# Patient Record
Sex: Female | Born: 1945 | Race: White | Hispanic: No | Marital: Married | Smoking: Never smoker
Health system: Southern US, Community
[De-identification: ages and names within clinical notes are randomized; demographics above are authoritative.]

## PROBLEM LIST (undated history)

## (undated) DIAGNOSIS — R569 Unspecified convulsions: Secondary | ICD-10-CM

## (undated) DIAGNOSIS — I82409 Acute embolism and thrombosis of unspecified deep veins of unspecified lower extremity: Secondary | ICD-10-CM

## (undated) DIAGNOSIS — I2699 Other pulmonary embolism without acute cor pulmonale: Secondary | ICD-10-CM

## (undated) DIAGNOSIS — K219 Gastro-esophageal reflux disease without esophagitis: Secondary | ICD-10-CM

## (undated) DIAGNOSIS — K649 Unspecified hemorrhoids: Secondary | ICD-10-CM

## (undated) DIAGNOSIS — J449 Chronic obstructive pulmonary disease, unspecified: Secondary | ICD-10-CM

## (undated) DIAGNOSIS — I209 Angina pectoris, unspecified: Secondary | ICD-10-CM

## (undated) DIAGNOSIS — I10 Essential (primary) hypertension: Secondary | ICD-10-CM

## (undated) DIAGNOSIS — R0602 Shortness of breath: Secondary | ICD-10-CM

## (undated) DIAGNOSIS — M1990 Unspecified osteoarthritis, unspecified site: Secondary | ICD-10-CM

## (undated) DIAGNOSIS — R011 Cardiac murmur, unspecified: Secondary | ICD-10-CM

## (undated) DIAGNOSIS — Z87898 Personal history of other specified conditions: Secondary | ICD-10-CM

## (undated) DIAGNOSIS — I480 Paroxysmal atrial fibrillation: Secondary | ICD-10-CM

## (undated) DIAGNOSIS — Z87442 Personal history of urinary calculi: Secondary | ICD-10-CM

## (undated) DIAGNOSIS — E039 Hypothyroidism, unspecified: Secondary | ICD-10-CM

## (undated) DIAGNOSIS — J45909 Unspecified asthma, uncomplicated: Secondary | ICD-10-CM

## (undated) HISTORY — PX: LITHOTRIPSY: SUR834

## (undated) HISTORY — PX: CHOLECYSTECTOMY: SHX55

---

## 1991-03-20 HISTORY — PX: BREAST SURGERY: SHX581

## 1999-04-20 ENCOUNTER — Other Ambulatory Visit: Admission: RE | Admit: 1999-04-20 | Discharge: 1999-04-20 | Payer: Self-pay | Admitting: Gynecology

## 2000-07-23 ENCOUNTER — Other Ambulatory Visit: Admission: RE | Admit: 2000-07-23 | Discharge: 2000-07-23 | Payer: Self-pay | Admitting: Gynecology

## 2001-07-28 ENCOUNTER — Other Ambulatory Visit: Admission: RE | Admit: 2001-07-28 | Discharge: 2001-07-28 | Payer: Self-pay | Admitting: Gynecology

## 2002-08-10 ENCOUNTER — Other Ambulatory Visit: Admission: RE | Admit: 2002-08-10 | Discharge: 2002-08-10 | Payer: Self-pay | Admitting: Gynecology

## 2002-08-28 ENCOUNTER — Ambulatory Visit (HOSPITAL_COMMUNITY): Admission: RE | Admit: 2002-08-28 | Discharge: 2002-08-28 | Payer: Self-pay | Admitting: Gastroenterology

## 2002-08-28 ENCOUNTER — Encounter (INDEPENDENT_AMBULATORY_CARE_PROVIDER_SITE_OTHER): Payer: Self-pay | Admitting: Specialist

## 2002-08-28 ENCOUNTER — Encounter: Payer: Self-pay | Admitting: Gastroenterology

## 2003-09-09 ENCOUNTER — Other Ambulatory Visit: Admission: RE | Admit: 2003-09-09 | Discharge: 2003-09-09 | Payer: Self-pay | Admitting: Gynecology

## 2004-07-04 ENCOUNTER — Inpatient Hospital Stay (HOSPITAL_COMMUNITY): Admission: EM | Admit: 2004-07-04 | Discharge: 2004-07-06 | Payer: Self-pay | Admitting: *Deleted

## 2004-07-05 ENCOUNTER — Encounter: Payer: Self-pay | Admitting: Cardiology

## 2004-07-09 ENCOUNTER — Encounter: Admission: RE | Admit: 2004-07-09 | Discharge: 2004-07-09 | Payer: Self-pay | Admitting: Internal Medicine

## 2004-07-20 ENCOUNTER — Ambulatory Visit (HOSPITAL_COMMUNITY): Admission: RE | Admit: 2004-07-20 | Discharge: 2004-07-20 | Payer: Self-pay | Admitting: Internal Medicine

## 2004-09-11 ENCOUNTER — Other Ambulatory Visit: Admission: RE | Admit: 2004-09-11 | Discharge: 2004-09-11 | Payer: Self-pay | Admitting: Gynecology

## 2005-03-19 DIAGNOSIS — R569 Unspecified convulsions: Secondary | ICD-10-CM

## 2005-03-19 HISTORY — DX: Unspecified convulsions: R56.9

## 2006-12-13 ENCOUNTER — Emergency Department (HOSPITAL_COMMUNITY): Admission: EM | Admit: 2006-12-13 | Discharge: 2006-12-13 | Payer: Self-pay | Admitting: Emergency Medicine

## 2006-12-27 ENCOUNTER — Encounter: Admission: RE | Admit: 2006-12-27 | Discharge: 2006-12-27 | Payer: Self-pay | Admitting: Orthopedic Surgery

## 2007-07-21 ENCOUNTER — Encounter: Admission: RE | Admit: 2007-07-21 | Discharge: 2007-07-21 | Payer: Self-pay | Admitting: Gastroenterology

## 2008-10-01 ENCOUNTER — Encounter: Admission: RE | Admit: 2008-10-01 | Discharge: 2008-10-01 | Payer: Self-pay | Admitting: Internal Medicine

## 2009-02-17 ENCOUNTER — Encounter: Admission: RE | Admit: 2009-02-17 | Discharge: 2009-02-17 | Payer: Self-pay | Admitting: Internal Medicine

## 2009-10-11 ENCOUNTER — Encounter: Admission: RE | Admit: 2009-10-11 | Discharge: 2009-10-11 | Payer: Self-pay | Admitting: Internal Medicine

## 2009-10-17 ENCOUNTER — Ambulatory Visit (HOSPITAL_COMMUNITY): Admission: RE | Admit: 2009-10-17 | Discharge: 2009-10-17 | Payer: Self-pay | Admitting: Urology

## 2010-01-12 ENCOUNTER — Ambulatory Visit (HOSPITAL_COMMUNITY)
Admission: RE | Admit: 2010-01-12 | Discharge: 2010-01-12 | Payer: Self-pay | Source: Home / Self Care | Admitting: Urology

## 2010-03-19 DIAGNOSIS — I82409 Acute embolism and thrombosis of unspecified deep veins of unspecified lower extremity: Secondary | ICD-10-CM

## 2010-03-19 HISTORY — DX: Acute embolism and thrombosis of unspecified deep veins of unspecified lower extremity: I82.409

## 2010-05-31 LAB — CBC
HCT: 36.4 % (ref 36.0–46.0)
Hemoglobin: 12.5 g/dL (ref 12.0–15.0)
MCH: 30.4 pg (ref 26.0–34.0)
MCHC: 34.4 g/dL (ref 30.0–36.0)
MCV: 88.3 fL (ref 78.0–100.0)
Platelets: 246 10*3/uL (ref 150–400)
RBC: 4.12 MIL/uL (ref 3.87–5.11)
RDW: 15.4 % (ref 11.5–15.5)
WBC: 7.6 10*3/uL (ref 4.0–10.5)

## 2010-05-31 LAB — BASIC METABOLIC PANEL
BUN: 23 mg/dL (ref 6–23)
CO2: 29 mEq/L (ref 19–32)
Calcium: 9.2 mg/dL (ref 8.4–10.5)
Chloride: 107 mEq/L (ref 96–112)
Creatinine, Ser: 1.46 mg/dL — ABNORMAL HIGH (ref 0.4–1.2)
GFR calc Af Amer: 43 mL/min — ABNORMAL LOW (ref >60)
GFR calc non Af Amer: 36 mL/min — ABNORMAL LOW (ref >60)
Glucose, Bld: 102 mg/dL — ABNORMAL HIGH (ref 70–99)
Potassium: 4 mEq/L (ref 3.5–5.1)
Sodium: 142 mEq/L (ref 135–145)

## 2010-06-02 LAB — BASIC METABOLIC PANEL
BUN: 22 mg/dL (ref 6–23)
CO2: 27 mEq/L (ref 19–32)
Calcium: 9.5 mg/dL (ref 8.4–10.5)
Chloride: 105 mEq/L (ref 96–112)
Creatinine, Ser: 2 mg/dL — ABNORMAL HIGH (ref 0.4–1.2)
GFR calc Af Amer: 30 mL/min — ABNORMAL LOW (ref >60)
GFR calc non Af Amer: 25 mL/min — ABNORMAL LOW (ref >60)
Glucose, Bld: 101 mg/dL — ABNORMAL HIGH (ref 70–99)
Potassium: 4.1 mEq/L (ref 3.5–5.1)
Sodium: 141 mEq/L (ref 135–145)

## 2010-06-02 LAB — CBC
HCT: 37.6 % (ref 36.0–46.0)
Hemoglobin: 12.9 g/dL (ref 12.0–15.0)
MCH: 30.1 pg (ref 26.0–34.0)
MCHC: 34.3 g/dL (ref 30.0–36.0)
MCV: 87.7 fL (ref 78.0–100.0)
Platelets: 309 10*3/uL (ref 150–400)
RBC: 4.29 MIL/uL (ref 3.87–5.11)
RDW: 14.4 % (ref 11.5–15.5)
WBC: 8.1 10*3/uL (ref 4.0–10.5)

## 2010-08-04 NOTE — Discharge Summary (Signed)
Shannon Hammond, LAGO NO.:  [id number]   MEDICAL RECORD NO.:  [id number]          PATIENT TYPE:  INP   LOCATION:  [id number]                         FACILITY:  [facility]   PHYSICIAN:  Ron Parker, M.D.DATE OF BIRTH:  [birth year]   DATE OF ADMISSION:  07/04/2004  DATE OF DISCHARGE:                                 DISCHARGE SUMMARY   PRIMARY CARE PHYSICIAN:  Dr. Tommy Medal.   DISCHARGE DIAGNOSES:  1.  Transient amnesia.  2.  Hypertension.  3.  Hypothyroidism.  4.  Gastroesophageal reflux disease.  5.  Dyslipidemia.   DISCHARGE MEDICATIONS:  1.  Zocor 10 mg p.o. q.h.s.  2.  Two baby aspirin a day.   Old medications to resume:  1.  Synthroid 75 mcg p.o. daily.  2.  Maxzide 75/50, 1/2 tablet p.o. daily.  3.  Prevacid 30 mg p.o. daily.  4.  Nasonex p.r.n.   FOLLOWUP:  The patient is being currently scheduled for an open MRI as an  outpatient.  The patient needs to make an appointment to see her primary  care physician for follow-up labs as well as hospital followup.   DIAGNOSTIC STUDIES AND TESTS:  1.  Carotid Dopplers done July 05, 2004 show an impression of right carotid      68% (lowest end of range) ICA stenosis, ECA stenosis.  However, vessel      tortuosity may cause higher velocities.  Left:  No evidence of      significant ICA stenosis or ECA stenosis.  Bilaterally the vertebral      arteries show antegrade flow.  2.  A 2-D echo.  Impression:  Normal LV wall motion, EF 55-65%.  Aortic      valve was mildly thickened.  Mild mitral anular calcification.  There      was a mild increase in the estimated peak pulmonary artery systolic      pressure.  It was a technically difficult study to determine any obvious      source of embolus which was not present.  3.  Head CT without contrast day of admission shows no evidence for acute      intracranial abnormalities.   HOSPITAL COURSE:  Please see H&P for details of admission.  1.  Transient amnesia:  The  patient was worked up and ruled out for any      acute CVA.  TIA has not been excluded. However, symptoms do not appear      to be consistent with that.  She was on a baby aspirin a day, and that      has been empirically increased to 2 baby aspirin for both cardiac as      well as neurologic prophylaxis.  She had elevated fasting lipid panel,      and the MRI/MRA that has been scheduled will have to be done on an      outpatient basis, since there are no open MRIs available.  During her      workup, she was negative for cardiac enzymes.  A 2-D echo as above and  TSH returned back normal.  RPR was checked, but this was negative.      Cholesterol panel shows findings as described below.  Throughout her      hospitalization, she had no further episodes.  On the day of discharge      she was stable, tolerating good p.o. diet.  2.  Dyslipidemia:  Fasting lipid profile shows a total cholesterol of 202,      triglycerides of 74, HDL of 53 and LDL of 134.  She was initiated on      Zocor 10 mg at night.  For followup she needs LFTs checked to determine      whether or not she can continue on this dose.  At the time of discharge      she had not been complaining of any myopathies or myalgias.  3.  Hypertension.  No changes in medication, to resume her own medications.  4.  Hypothyroidism.  TSH was normal.  No change in her thyroid replacement      and will be continued on discharge.  5.  Gastroesophageal reflux disease.  No change.  Continue the Prevacid.   DISPOSITION:  The patient is being discharged home in stable condition.  Note that the carotid Dopplers do show that she has fairly significant  carotid disease.  However, it would not explain her symptoms.  The primary  care physician may consider a CVTS referral as an outpatient to determine  whether or not she would be a candidate for CEA.  Also recommended repeat  FLP in 6 weeks to determine increase in dose and needs encouragement  with  lifestyle modifications.  The patient has been instructed on discharge day  to make an appointment with Dr. Minna Antis following her MRI test which is being  scheduled currently and will be available on the pink sheet prior to her  leaving the hospital.      JD/MEDQ  D:  07/06/2004  T:  07/06/2004  Job:  [id number]   cc:   Tommy Medal, M.D.  [address]  [place], [place] [address]  Fax: [phone]

## 2010-08-04 NOTE — Op Note (Signed)
   NAME:  Shannon Hammond, Shannon Hammond                          ACCOUNT NO.:  [id number]   MEDICAL RECORD NO.:  [id number]                   PATIENT TYPE:  AMB   LOCATION:  ENDO                                 FACILITY:  [facility]   PHYSICIAN:  Jeryl Columbia, M.D.                 DATE OF BIRTH:  18-Feb-1946   DATE OF PROCEDURE:  08/28/2002  DATE OF DISCHARGE:                                 OPERATIVE REPORT   PROCEDURE:  EGD with biopsy.   INDICATION:  Upper tract symptoms.  Consent was signed after risks,  benefits, methods, options thoroughly discussed in the office.   MEDICINES USED ADDITIONALLY:  Only 2 of Versed.   DESCRIPTION OF PROCEDURE:  The video endoscope inserted by direct vision.  Esophagus was normal.  In the distal esophagus was a small hiatal hernia.  The scope passed into the stomach, where lots of palpable hyperplastic-  appearing polyps were seen, although some were moderate size.  Scope passed  through a normal antrum, normal pylorus, into a normal duodenal bulb  __________ second portion of duodenum.  The scope was withdrawn back to the  bulb, and a good look there ruled out ulcers in that location.  The scope  was drawn back to the stomach and retroflexed.  High in the cardia, the  hiatal hernia was confirmed.  Fundus, angularis, lesser and greater curve  were evaluated on retroflex and then straight visualization and other than  the polyps, no other abnormalities were seen.  Multiple biopsies were  obtained but based on the number of polyps, not all were sampled.  Air was  suctioned.  The scope was slowly withdrawn.  Again, a good look at the  esophagus was normal.  Scope was removed.  The patient tolerated the  procedure well, and there was no obvious immediate complication.   ENDOSCOPIC DIAGNOSES:  1. Small hiatal hernia.  2. Multiple gastric polyps, some biopsied.  3. Otherwise within normal limits esophagogastroduodenoscopy.   PLAN:  1. Continue pump inhibitors, as they  help her symptoms.  2. Await pathology.  3. Probably need to repeat EGD at some point in the future based on     pathology just re-sample more of the polyps.                                               Jeryl Columbia, M.D.    MEM/MEDQ  D:  08/28/2002  T:  08/28/2002  Job:  [id number]

## 2010-08-04 NOTE — Procedures (Signed)
CLINICAL HISTORY:  [age]-year-old woman with transient global amnesia 2  weeks ago, the episode lasted for an hour and a half. Study is done to look  for the presence of seizures.   PROCEDURE:  The tracing is carried out on a 32-channel digital Cadwell  recorder reformatted into 16-channel montages with one devoted to EKG. The  patient was awake during the recording. The International 10/20 system lead  placement was used.   DESCRIPTION OF FINDINGS:  Dominant frequency is an 8-9 Hz 15 microvolt alpha  range activity that is broadly distributed, mixed frequency predominantly  alpha and beta range activity were seen. Photic stimulation induced a  driving response from 9-17 Hz. There was no focal slowing. There was no  interictal epileptiform activity in the form of spikes or sharp waves. EKG  showed regular sinus rhythm with ventricular response of 54 beats per  minute.   IMPRESSION:  Normal waking record.      [id number]  D:  07/20/2004 17:23:16  T:  07/20/2004 19:26:56  Job #:  [id number]   cc:   Tommy Medal, M.D.  [address]  [place], [place] [address]  Fax: [phone]

## 2010-08-04 NOTE — H&P (Signed)
NAMEMAHEK, LASPISA NO.:  [id number]   MEDICAL RECORD NO.:  [id number]          PATIENT TYPE:  EMS   LOCATION:  ED                           [facility]ACILITY:  [facility]   PHYSICIAN:  Barbette Merino, M.D.      DATE OF BIRTH:  September 19, 1945   DATE OF ADMISSION:  07/04/2004  DATE OF DISCHARGE:                                HISTORY & PHYSICAL   PRIMARY CARE PHYSICIAN:  Tommy Medal, M.D.   CHIEF COMPLAINT:  Loss of memory and confusion.   HISTORY OF PRESENT ILLNESS:  This is a 65 year old female with history of  hypertension, dyslipidemia and hypothyroidism who presented with transient  amnesia this morning.  Per patient's husband he observed the patient was  confused this morning although she did not pass out.  He asked her multiple  questions including date, time and events that occurred the night before and  the patient had no recollection.  The patient herself has no recollection of  what happened this morning  All she remembered was being asked to make  coffee and that was it.  She tried to take her grandson to school but had no  recollection of doing that.  Her symptoms lasted approximately one and a  half hours but have since resolved.  The patient currently is alert and  oriented and has recollection of all events last night including an outing  she had with her daughter.  The patient's husband was worried and hence he  brought her to the emergency room.   PAST MEDICAL HISTORY:  1.  Hypertension.  2.  Hypothyroidism.  3.  GERD.  4.  Dyslipidemia.  5.  The patient is status post cholecystectomy.  6.  The patient reportedly had a heart murmur.   CURRENT MEDICATIONS:  1.  Synthroid 0.075 mg daily.  2.  Maxzide 75/50 one half tablet daily.  3.  Prevacid 30 mg daily.  4.  Baby aspirin.  5.  The patient also has Nasonex nasal inhaled for sinus disease allergic      rhinitis.   ALLERGIES:  No known drug allergies but she is intolerant to NSAIDs due to  GERD.   SOCIAL HISTORY:  The patient lives in [place] [facility] [place] [facility] with her husband and  one of her grandchildren.  She and her husband have two kids and four  grandchildren.  The patient has never smoked and does not drink alcohol.  Also denies any IV drug use.   FAMILY HISTORY:  The patient's dad died in his 54s from coronary artery  disease.  He had an MI at the age of 84.  Mom died last 12-11-2022 at the age of  84 years from fall and pneumonia.  The patient denied any family history of  CVA.   REVIEW OF SYSTEMS:  A 10-point review of systems performed essentially  negative.   PHYSICAL EXAMINATION:  VITAL SIGNS:  Temperature 97.9, blood pressure  162/100, pulse 81, respiratory rate 20, saturations 97% on room air.  GENERAL:  The patient is alert and oriented x3.  She is in no acute  distress.  Very conversant and pleasant.  HEENT:  PERRL.  EOMI.  No facial droop.  NECK:  Supple, no JVD, no lymphadenopathy.  RESPIRATORY:  The patient has good air entry bilaterally.  No wheezes or  rales.  CARDIOVASCULAR:  The patient has regular rate and rhythm.  No discernible  murmur.  ABDOMEN:  Looks obese, nontender, positive bowel sounds.  EXTREMITIES:  Show no edema, cyanosis or clubbing.  NEUROLOGIC:  Essentially nonfocal with cranial nerves II-XII essentially  intact.  Power 5/5 bilaterally upper and lower extremities and reflexes 2+  bilaterally.   LABORATORY DATA:  White count 6.9, hemoglobin 13.9, platelet 303 with normal  differential.  Sodium 142, potassium 3.8, chloride 101, CO2 27, glucose 94,  BUN 14, creatinine 0.9, calcium 9.5, total protein 6.8, albumin 3.4.  AST  20, ALT 19.  Initial cardiac enzymes from the ED essentially normal.  Chest  x-ray normal.  Head CT without contrast performed in the ED showed no acute  insult.  Her EKG showed normal sinus rhythm with a rate of 63 beats per  minute, normal axis.  Low voltage throughout.  Poor R-wave progression in  the anterolateral leads.  The  patient also had ST depression in her  anterolateral leads.   ASSESSMENT:  This is a 65 year old female presenting with transient amnesia  which has since resolved.  The patient is at risk for a number of conditions  including CVA and cardiac disease.  Her risk factors for CVA include  hypertension and dyslipidemia and obesity.  These same risk factors are also  therefore cardiac disease.  In view of the patient's presentation chances  are she may have had a TIA.  Other possibilities include drugs, thyroid  disease, cardiac in origin which is less likely as well as possible  infections.  Something like UTI.  The head CT so far is negative.  The  patient is claustrophobic and hence we have been unable to get MRI on her at  this point.   PLAN:  Our plan therefore, would be to admit the patient for 23-hour  observation.  Start her on full dose aspirin in addition to her PPI for GI  protection.  I will attempt to get MRI/MRA probably with sedation.  Will  check carotid Dopplers, 2-D echo and do serial cardiac enzymes.  Will check  fasting lipid profile also for patient, and if that is high we will start  her on some Statin.  I will follow the patient's cardiac rhythm on telemetry  as well as follow her TSH level to see if it is within the right range.  Ultimately the patient will need to start life style modification for stroke  prevention.      LG/MEDQ  D:  07/04/2004  T:  07/04/2004  Job:  [id number]   cc:   Tommy Medal, M.D.  [address]  [place], [place] [address]  Fax: [phone]

## 2010-08-04 NOTE — Op Note (Signed)
   NAME:  Shannon Hammond, Shannon Hammond                          ACCOUNT NO.:  [id number]   MEDICAL RECORD NO.:  [id number]                   PATIENT TYPE:  AMB   LOCATION:  ENDO                                 FACILITY:  [facility]   PHYSICIAN:  Jeryl Columbia, M.D.                 DATE OF BIRTH:  10-02-1945   DATE OF PROCEDURE:  08/28/2002  DATE OF DISCHARGE:                                 OPERATIVE REPORT   PROCEDURE:  Colonoscopy.   INDICATION:  Screening, history of colon polyps.  Consent was signed after  risks, benefits, methods, options were thoroughly discussed in the office on  multiple occasions.   MEDICINES USED:  1. Demerol 75.  2. Versed 8.   DESCRIPTION OF PROCEDURE:  Rectal inspection was pertinent for external  hemorrhoids, small.  Digital exam was negative first and a regular video  colonoscope was inserted with lots of difficulty due to a long, looping,  tortuous colon, could get to somewhere in the transverse, possibly hepatic  flexure but with advancing any further, causing pain despite trying rolling  her on her back, rolling her on her right side, and back to her left side  and multiple abdominal pressures.  We elected to slowly withdraw.  No polyps  were seen on withdrawal.  Once back in the rectum, the scope was  retroflexed, pertinent for some internal hemorrhoids.  Pediatric video  adjustable colonoscope was inserted, and possibly we were able to get  further but could not get to the cecum.  We did fully try with her on her  left side and her back.  The scope was again slowly withdrawn.  Again, no  polyps were seen.  The scope was removed.  The patient tolerated the  procedure fair.  There was no obvious immediate complication.   ENDOSCOPIC DIAGNOSES:  1. Internal-external hemorrhoids.  2. Tortuous, long, looping colon.  3. Otherwise, within normal limits to the questionable hepatic flexure.  We     placed a regular and pediatric adjustable scope.   PLAN:  1. Get a  barium enema and probably just get these every five years.  2. Continue work-up with an EGD.  3. Possibly in the future if we needed to re-try her, she might be a good     Diprivan candidate to relax her colon even more.                                               Jeryl Columbia, M.D.    MEM/MEDQ  D:  08/28/2002  T:  08/28/2002  Job:  [id number]

## 2011-06-21 ENCOUNTER — Other Ambulatory Visit: Payer: Self-pay | Admitting: Internal Medicine

## 2011-06-21 DIAGNOSIS — I2699 Other pulmonary embolism without acute cor pulmonale: Secondary | ICD-10-CM

## 2011-06-25 ENCOUNTER — Ambulatory Visit
Admission: RE | Admit: 2011-06-25 | Discharge: 2011-06-25 | Disposition: A | Discharge status: Home / Self Care | Payer: Medicare Other | Source: Ambulatory Visit | Attending: Internal Medicine | Admitting: Internal Medicine

## 2011-06-25 DIAGNOSIS — I2699 Other pulmonary embolism without acute cor pulmonale: Secondary | ICD-10-CM

## 2011-12-10 ENCOUNTER — Other Ambulatory Visit: Payer: Self-pay | Admitting: Internal Medicine

## 2011-12-10 DIAGNOSIS — R109 Unspecified abdominal pain: Secondary | ICD-10-CM

## 2011-12-13 ENCOUNTER — Ambulatory Visit
Admission: RE | Admit: 2011-12-13 | Discharge: 2011-12-13 | Disposition: A | Discharge status: Home / Self Care | Payer: Medicare Other | Source: Ambulatory Visit | Attending: Internal Medicine | Admitting: Internal Medicine

## 2011-12-13 DIAGNOSIS — R109 Unspecified abdominal pain: Secondary | ICD-10-CM

## 2012-04-09 ENCOUNTER — Other Ambulatory Visit (HOSPITAL_COMMUNITY): Payer: Self-pay | Admitting: Internal Medicine

## 2012-04-09 DIAGNOSIS — R0602 Shortness of breath: Secondary | ICD-10-CM

## 2012-04-16 ENCOUNTER — Ambulatory Visit (HOSPITAL_COMMUNITY)
Admission: RE | Admit: 2012-04-16 | Discharge: 2012-04-16 | Disposition: A | Discharge status: Home / Self Care | Payer: Medicare Other | Source: Ambulatory Visit | Attending: Internal Medicine | Admitting: Internal Medicine

## 2012-04-16 DIAGNOSIS — R0602 Shortness of breath: Secondary | ICD-10-CM | POA: Insufficient documentation

## 2012-04-16 MED ORDER — ALBUTEROL SULFATE (5 MG/ML) 0.5% IN NEBU
2.5000 mg | INHALATION_SOLUTION | Freq: Once | RESPIRATORY_TRACT | Status: AC
  Administered 2012-04-16: 2.5 mg via RESPIRATORY_TRACT

## 2012-10-08 ENCOUNTER — Other Ambulatory Visit: Payer: Self-pay | Admitting: Gastroenterology

## 2012-11-13 ENCOUNTER — Other Ambulatory Visit: Payer: Self-pay | Admitting: Internal Medicine

## 2012-11-13 ENCOUNTER — Ambulatory Visit
Admission: RE | Admit: 2012-11-13 | Discharge: 2012-11-13 | Disposition: A | Discharge status: Home / Self Care | Payer: Medicare Other | Source: Ambulatory Visit | Attending: Internal Medicine | Admitting: Internal Medicine

## 2012-11-13 DIAGNOSIS — R319 Hematuria, unspecified: Secondary | ICD-10-CM

## 2012-11-18 ENCOUNTER — Other Ambulatory Visit: Payer: Self-pay | Admitting: Urology

## 2012-11-19 ENCOUNTER — Other Ambulatory Visit: Payer: Self-pay | Admitting: Urology

## 2012-11-20 ENCOUNTER — Encounter (HOSPITAL_COMMUNITY): Payer: Self-pay | Admitting: Pharmacy Technician

## 2012-11-24 ENCOUNTER — Encounter (HOSPITAL_COMMUNITY): Payer: Self-pay

## 2012-11-24 ENCOUNTER — Ambulatory Visit (HOSPITAL_COMMUNITY)
Admission: RE | Admit: 2012-11-24 | Discharge: 2012-11-24 | Disposition: A | Discharge status: Home / Self Care | Payer: Medicare Other | Source: Ambulatory Visit | Attending: Urology | Admitting: Urology

## 2012-11-24 ENCOUNTER — Encounter (HOSPITAL_COMMUNITY)
Admission: RE | Admit: 2012-11-24 | Discharge: 2012-11-24 | Disposition: A | Discharge status: Home / Self Care | Payer: Medicare Other | Source: Ambulatory Visit | Attending: Urology | Admitting: Urology

## 2012-11-24 DIAGNOSIS — N201 Calculus of ureter: Secondary | ICD-10-CM | POA: Insufficient documentation

## 2012-11-24 DIAGNOSIS — Z01818 Encounter for other preprocedural examination: Secondary | ICD-10-CM | POA: Insufficient documentation

## 2012-11-24 DIAGNOSIS — Z01812 Encounter for preprocedural laboratory examination: Secondary | ICD-10-CM | POA: Insufficient documentation

## 2012-11-24 HISTORY — DX: Hypothyroidism, unspecified: E03.9

## 2012-11-24 HISTORY — DX: Personal history of other specified conditions: Z87.898

## 2012-11-24 HISTORY — DX: Personal history of urinary calculi: Z87.442

## 2012-11-24 HISTORY — DX: Unspecified osteoarthritis, unspecified site: M19.90

## 2012-11-24 HISTORY — DX: Acute embolism and thrombosis of unspecified deep veins of unspecified lower extremity: I82.409

## 2012-11-24 HISTORY — DX: Unspecified convulsions: R56.9

## 2012-11-24 HISTORY — DX: Angina pectoris, unspecified: I20.9

## 2012-11-24 HISTORY — DX: Other pulmonary embolism without acute cor pulmonale: I26.99

## 2012-11-24 HISTORY — DX: Gastro-esophageal reflux disease without esophagitis: K21.9

## 2012-11-24 HISTORY — DX: Shortness of breath: R06.02

## 2012-11-24 HISTORY — DX: Cardiac murmur, unspecified: R01.1

## 2012-11-24 HISTORY — DX: Unspecified hemorrhoids: K64.9

## 2012-11-24 LAB — CBC
HCT: 39.9 % (ref 36.0–46.0)
Hemoglobin: 12.9 g/dL (ref 12.0–15.0)
MCH: 28.2 pg (ref 26.0–34.0)
MCHC: 32.3 g/dL (ref 30.0–36.0)

## 2012-11-24 LAB — BASIC METABOLIC PANEL
BUN: 31 mg/dL — ABNORMAL HIGH (ref 6–23)
Calcium: 10.1 mg/dL (ref 8.4–10.5)
GFR calc non Af Amer: 26 mL/min — ABNORMAL LOW (ref >90)
Glucose, Bld: 86 mg/dL (ref 70–99)
Potassium: 4.5 mEq/L (ref 3.5–5.1)

## 2012-11-24 NOTE — Progress Notes (Signed)
11/24/12 1529  OBSTRUCTIVE SLEEP APNEA  Have you ever been diagnosed with sleep apnea through a sleep study? No  Do you snore loudly (loud enough to be heard through closed doors)?  0  Do you often feel tired, fatigued, or sleepy during the daytime? 1  Has anyone observed you stop breathing during your sleep? 0  Do you have, or are you being treated for high blood pressure? 1  BMI more than 35 kg/m2? 1  Age over 69 years old? 1  Neck circumference greater than 40 cm/18 inches? 0  Gender: 0  Obstructive Sleep Apnea Score 4  Score 4 or greater  Results sent to PCP

## 2012-11-24 NOTE — Pre-Procedure Instructions (Addendum)
Pt has ekg report on her chart from Dr. Minna Antis - done 04/03/12.  CXR was done today - preop - at [facility] REPORT FAXED TO DR. DAHLSTEDT'S OFFICE - BUN 31, CREATININE 1.90

## 2012-11-24 NOTE — Patient Instructions (Addendum)
YOUR SURGERY IS SCHEDULED AT [facility]  ON:  Thursday  9/11  REPORT TO [facility] SHORT STAY CENTER AT:  7:15 AM      PHONE # FOR SHORT STAY IS [phone]  DO NOT EAT OR DRINK ANYTHING AFTER MIDNIGHT THE NIGHT BEFORE YOUR SURGERY.  YOU MAY BRUSH YOUR TEETH, RINSE OUT YOUR MOUTH--BUT NO WATER, NO FOOD, NO CHEWING GUM, NO MINTS, NO CANDIES, NO CHEWING TOBACCO.  PLEASE TAKE THE FOLLOWING MEDICATIONS THE AM OF YOUR SURGERY WITH A FEW SIPS OF WATER:  METOPROLOL, LEVOTHYROXINE, OMEPRAZOLE.  USE YOUR INHALER ( XOPENEX )   DO NOT BRING VALUABLES, MONEY, CREDIT CARDS.  DO NOT WEAR JEWELRY, MAKE-UP, NAIL POLISH AND NO METAL PINS OR CLIPS IN YOUR HAIR. CONTACT LENS, DENTURES / PARTIALS, GLASSES SHOULD NOT BE WORN TO SURGERY AND IN MOST CASES-HEARING AIDS WILL NEED TO BE REMOVED.  BRING YOUR GLASSES CASE, ANY EQUIPMENT NEEDED FOR YOUR CONTACT LENS. FOR PATIENTS ADMITTED TO THE HOSPITAL--CHECK OUT TIME THE DAY OF DISCHARGE IS 11:00 AM.  ALL INPATIENT ROOMS ARE PRIVATE - WITH BATHROOM, TELEPHONE, TELEVISION AND WIFI INTERNET.  IF YOU ARE BEING DISCHARGED THE SAME DAY OF YOUR SURGERY--YOU CAN NOT DRIVE YOURSELF HOME--AND SHOULD NOT GO HOME ALONE BY TAXI OR BUS.  NO DRIVING OR OPERATING MACHINERY FOR 24 HOURS FOLLOWING ANESTHESIA / PAIN MEDICATIONS.  PLEASE MAKE ARRANGEMENTS FOR SOMEONE TO BE WITH YOU AT HOME THE FIRST 24 HOURS AFTER SURGERY. RESPONSIBLE DRIVER'S NAME  Nunda - PT'S HUSBAND WILL BE WITH HER                                                                           FAILURE TO FOLLOW THESE INSTRUCTIONS MAY RESULT IN THE CANCELLATION OF YOUR SURGERY.   PATIENT SIGNATURE_________________________________

## 2012-11-27 ENCOUNTER — Ambulatory Visit (HOSPITAL_COMMUNITY): Payer: Medicare Other | Admitting: Anesthesiology

## 2012-11-27 ENCOUNTER — Encounter (HOSPITAL_COMMUNITY): Payer: Self-pay | Admitting: Anesthesiology

## 2012-11-27 ENCOUNTER — Encounter (HOSPITAL_COMMUNITY)
Admission: RE | Disposition: A | Discharge status: Home / Self Care | Payer: Self-pay | Source: Ambulatory Visit | Attending: Urology

## 2012-11-27 ENCOUNTER — Ambulatory Visit (HOSPITAL_COMMUNITY)
Admission: RE | Admit: 2012-11-27 | Discharge: 2012-11-27 | Disposition: A | Discharge status: Home / Self Care | Payer: Medicare Other | Source: Ambulatory Visit | Attending: Urology | Admitting: Urology

## 2012-11-27 ENCOUNTER — Encounter (HOSPITAL_COMMUNITY): Payer: Self-pay | Admitting: *Deleted

## 2012-11-27 DIAGNOSIS — Z86718 Personal history of other venous thrombosis and embolism: Secondary | ICD-10-CM | POA: Insufficient documentation

## 2012-11-27 DIAGNOSIS — E039 Hypothyroidism, unspecified: Secondary | ICD-10-CM | POA: Insufficient documentation

## 2012-11-27 DIAGNOSIS — N289 Disorder of kidney and ureter, unspecified: Secondary | ICD-10-CM | POA: Insufficient documentation

## 2012-11-27 DIAGNOSIS — Z86711 Personal history of pulmonary embolism: Secondary | ICD-10-CM | POA: Insufficient documentation

## 2012-11-27 DIAGNOSIS — K219 Gastro-esophageal reflux disease without esophagitis: Secondary | ICD-10-CM | POA: Insufficient documentation

## 2012-11-27 DIAGNOSIS — N200 Calculus of kidney: Secondary | ICD-10-CM | POA: Insufficient documentation

## 2012-11-27 HISTORY — PX: CYSTOSCOPY WITH RETROGRADE PYELOGRAM, URETEROSCOPY AND STENT PLACEMENT: SHX5789

## 2012-11-27 HISTORY — PX: HOLMIUM LASER APPLICATION: SHX5852

## 2012-11-27 SURGERY — CYSTOURETEROSCOPY, WITH RETROGRADE PYELOGRAM AND STENT INSERTION
Anesthesia: General | Laterality: Right | Wound class: Dirty or Infected

## 2012-11-27 MED ORDER — PROPOFOL 10 MG/ML IV BOLUS
INTRAVENOUS | Status: DC | PRN
  Administered 2012-11-27: 200 mg via INTRAVENOUS

## 2012-11-27 MED ORDER — OXYCODONE HCL 5 MG PO TABS
5.0000 mg | ORAL_TABLET | ORAL | Status: DC | PRN
Start: 2012-11-27 — End: 2012-11-27

## 2012-11-27 MED ORDER — SODIUM CHLORIDE 0.9 % IR SOLN
Status: DC | PRN
  Administered 2012-11-27: 3000 mL via INTRAVESICAL

## 2012-11-27 MED ORDER — ONDANSETRON HCL 4 MG/2ML IJ SOLN
INTRAMUSCULAR | Status: DC | PRN
  Administered 2012-11-27: 4 mg via INTRAVENOUS

## 2012-11-27 MED ORDER — URIBEL 118 MG PO CAPS: 1.0000 | ORAL_CAPSULE | Freq: Three times a day (TID) | ORAL | Status: DC | PRN

## 2012-11-27 MED ORDER — MEPERIDINE HCL 50 MG/ML IJ SOLN: 6.2500 mg | INTRAMUSCULAR | Status: DC | PRN

## 2012-11-27 MED ORDER — LIDOCAINE HCL 1 % IJ SOLN
INTRAMUSCULAR | Status: DC | PRN
  Administered 2012-11-27: 60 mg via INTRADERMAL

## 2012-11-27 MED ORDER — FENTANYL CITRATE 0.05 MG/ML IJ SOLN
INTRAMUSCULAR | Status: DC | PRN
  Administered 2012-11-27: 100 ug via INTRAVENOUS

## 2012-11-27 MED ORDER — CEFAZOLIN SODIUM-DEXTROSE 2-3 GM-% IV SOLR
INTRAVENOUS | Status: AC
  Filled 2012-11-27: qty 50

## 2012-11-27 MED ORDER — MIDAZOLAM HCL 5 MG/5ML IJ SOLN
INTRAMUSCULAR | Status: DC | PRN
  Administered 2012-11-27 (×2): 2 mg via INTRAVENOUS

## 2012-11-27 MED ORDER — CIPROFLOXACIN HCL 500 MG PO TABS: 500.0000 mg | ORAL_TABLET | Freq: Two times a day (BID) | ORAL | Status: DC

## 2012-11-27 MED ORDER — CEFAZOLIN SODIUM 1-5 GM-% IV SOLN
INTRAVENOUS | Status: AC
  Filled 2012-11-27: qty 50

## 2012-11-27 MED ORDER — DEXTROSE 5 % IV SOLN
3.0000 g | INTRAVENOUS | Status: AC
  Administered 2012-11-27: 3 g via INTRAVENOUS

## 2012-11-27 MED ORDER — LACTATED RINGERS IV SOLN
INTRAVENOUS | Status: DC | PRN
  Administered 2012-11-27 (×2): via INTRAVENOUS

## 2012-11-27 MED ORDER — FENTANYL CITRATE 0.05 MG/ML IJ SOLN: 25.0000 ug | INTRAMUSCULAR | Status: DC | PRN

## 2012-11-27 MED ORDER — PROMETHAZINE HCL 25 MG/ML IJ SOLN: 6.2500 mg | INTRAMUSCULAR | Status: DC | PRN

## 2012-11-27 MED ORDER — DIATRIZOATE MEGLUMINE 30 % UR SOLN
URETHRAL | Status: DC | PRN
  Administered 2012-11-27: 18 mL

## 2012-11-27 SURGICAL SUPPLY — 20 items
BAG URO CATCHER STRL LF (DRAPE) ×2 IMPLANT
BASKET ZERO TIP NITINOL 2.4FR (BASKET) ×2 IMPLANT
CATH URET 5FR 28IN CONE TIP (BALLOONS)
CATH URET 5FR 28IN OPEN ENDED (CATHETERS) IMPLANT
CATH URET 5FR 70CM CONE TIP (BALLOONS) IMPLANT
CLOTH BEACON ORANGE TIMEOUT ST (SAFETY) ×2 IMPLANT
DRAPE CAMERA CLOSED 9X96 (DRAPES) ×2 IMPLANT
FIBER LASER TRAC TIP (UROLOGICAL SUPPLIES) ×2 IMPLANT
GLOVE BIOGEL M 8.0 STRL (GLOVE) ×2 IMPLANT
GLOVE SURG SS PI 8.0 STRL IVOR (GLOVE) IMPLANT
GOWN PREVENTION PLUS XLARGE (GOWN DISPOSABLE) ×2 IMPLANT
GOWN STRL REIN XL XLG (GOWN DISPOSABLE) ×2 IMPLANT
GUIDEWIRE STR DUAL SENSOR (WIRE) ×2 IMPLANT
MANIFOLD NEPTUNE II (INSTRUMENTS) ×2 IMPLANT
MARKER SKIN DUAL TIP RULER LAB (MISCELLANEOUS) IMPLANT
PACK CYSTO (CUSTOM PROCEDURE TRAY) ×2 IMPLANT
SHEATH ACCESS URETERAL 38CM (SHEATH) ×2 IMPLANT
STENT CONTOUR 6FRX24X.038 (STENTS) ×2 IMPLANT
TUBING CONNECTING 10 (TUBING) ×2 IMPLANT
WIRE COONS/BENSON .038X145CM (WIRE) IMPLANT

## 2012-11-27 NOTE — Transfer of Care (Signed)
Immediate Anesthesia Transfer of Care Note  Patient: Shannon Hammond  Procedure(s) Performed: Procedure(s): CYSTOSCOPY WITH RETROGRADE PYELOGRAM, URETEROSCOPY, stone basketry AND STENT PLACEMENT (Right) HOLMIUM LASER APPLICATION (Right)  Patient Location: PACU  Anesthesia Type:General  Level of Consciousness: awake, alert , oriented and patient cooperative  Airway & Oxygen Therapy: Patient Spontanous Breathing and Patient connected to face mask oxygen  Post-op Assessment: Report given to PACU RN, Post -op Vital signs reviewed and stable and Patient moving all extremities  Post vital signs: Reviewed and stable  Complications: No apparent anesthesia complications

## 2012-11-27 NOTE — H&P (Signed)
H&P  Chief Complaint: Kidney stone  History of Present Illness: Shannon Hammond is a 67 y.o. year old female who presents for endoscopic management of a right sided kidney stone. Her note from her most recent visit is below:    This 67 year old female with a prior history of urolithiasis is sent by [facility] for evaluation and management of a right sided kidney stone, diagnosed on a stone protocol CT on 11/13/2012.  Apparently, the patient has had intermittent right flank pain for several months.  She has had feeling of fatigue, and about 2 weeks ago had some chills.  She has not had gross hematuria.  She has had some anterior abdominal pain.  Prior stones and been treated with lithotripsy, the last at least 2 or 3 years ago by Dr. Serita Butcher, formerly of this practice.  By history, the patient had a creatinine level drawn the day prior which was 3.2.  I do not have direct lab results from that.  I do not have results of potassium levels either.  She has no left-sided pain.  She has been taking meloxicam a fair amount over the past few days, and an aspirin a day with her history of prior DVT.   Past Medical History  Diagnosis Date  . PE (pulmonary embolism)     HX PE 2012 - TX'D IN [place]- HOSPITALIZED X 1 WEEK.   NO LONGER ON BLOOD THINNER OTHER THAN 81 MG ASPIRIN  . Shortness of breath     PT TOLD BY HER MEDICAL DOCTOR - SOME LUNG CHANGES DUE TO 2ND HAND SMOKE - SHE WAS GIVEN INHALER TO USE AS NEEDED.  Marland Kitchen Anginal pain   . Hypothyroidism   . Heart murmur   . DVT (deep venous thrombosis) 2012    RIGHT LEG  . Seizures 2007    ONLY ONCE - THOUGHT TO BE STRESS INDUCED - PT WAS DEALING WITH HER MOTHER'S DEATH  . GERD (gastroesophageal reflux disease)   . Hemorrhoids   . History of kidney stones   . History of nocturia   . Arthritis     BACK AND KNEES    Past Surgical History  Procedure Laterality Date  . Breast surgery  1993    CYST REMOVED LEFT BREAST  .  Cholecystectomy  LATE 1990'S  . Lithotripsy      BOTH KIDNEYS    Home Medications:  No prescriptions prior to admission    Allergies: No Known Allergies  No family history on file.  Social History:  reports that she has never smoked. She has never used smokeless tobacco. She reports that she does not drink alcohol or use illicit drugs.  ROS: Genitourinary, constitutional, skin, eye, otolaryngeal, hematologic/lymphatic, cardiovascular, pulmonary, endocrine, musculoskeletal, gastrointestinal, neurological and psychiatric system(s) were reviewed and pertinent findings if present are noted.  Genitourinary: no dysuria.  Gastrointestinal: flank pain and abdominal pain.  Constitutional: feeling poorly (malaise) and feeling tired (fatigue).  Respiratory: shortness of breath. Genitourinary, constitutional, skin, eye, otolaryngeal, hematologic/lymphatic, cardiovascular, pulmonary, endocrine, musculoskeletal, gastrointestinal, neurological and psychiatric system(s) were reviewed and pertinent findings if present are noted.  Genitourinary: no dysuria.  Gastrointestinal: flank pain and abdominal pain.  Constitutional: feeling poorly (malaise) and feeling tired (fatigue).  Respiratory: shortness of breath.   Physical Exam:  Vital signs in last 24 hours:   Constitutional: Well nourished and well developed . No acute distress.  ENT:. The ears and nose are normal in appearance.  Neck: The appearance of the neck  is normal and no neck mass is present.  Pulmonary: No respiratory distress and normal respiratory rhythm and effort.  Cardiovascular: Heart rate and rhythm are normal . No peripheral edema. There is 1+ pretibial edema bilaterally.  Abdomen: The abdomen is obese.  Lymphatics: The femoral and inguinal nodes are not enlarged or tender.  Skin: Normal skin turgor, no visible rash and no visible skin lesions.  Neuro/Psych:. Mood and affect are appropriate.   Laboratory Data:  No results  found for this or any previous visit (from the past 24 hour(s)). No results found for this or any previous visit (from the past 240 hour(s)). Creatinine:  Recent Labs  11/24/12 0945  CREATININE 1.90*    Radiologic Imaging: No results found.  Impression/Assessment:  Faintly calcified 10 mm right UPJ stone  Plan:  Right ureteroscopic management of right UPJ stone  Amareon Phung M 11/27/2012, 5:51 AM  Lillette Boxer. Modupe Shampine MD

## 2012-11-27 NOTE — Anesthesia Postprocedure Evaluation (Signed)
  Anesthesia Post-op Note  Patient: Shannon Hammond  Procedure(s) Performed: Procedure(s) (LRB): CYSTOSCOPY WITH RETROGRADE PYELOGRAM, URETEROSCOPY, stone basketry AND STENT PLACEMENT (Right) HOLMIUM LASER APPLICATION (Right)  Patient Location: PACU  Anesthesia Type: General  Level of Consciousness: awake and alert   Airway and Oxygen Therapy: Patient Spontanous Breathing  Post-op Pain: mild  Post-op Assessment: Post-op Vital signs reviewed, Patient's Cardiovascular Status Stable, Respiratory Function Stable, Patent Airway and No signs of Nausea or vomiting  Last Vitals:  Filed Vitals:   11/27/12 1134  BP: 130/62  Pulse: 59  Temp: 36 C  Resp: 16    Post-op Vital Signs: stable   Complications: No apparent anesthesia complications

## 2012-11-27 NOTE — Interval H&P Note (Signed)
History and Physical Interval Note:  11/27/2012 8:54 AM  Shannon Hammond  has presented today for surgery, with the diagnosis of RIGHT URETERAL STONE  The various methods of treatment have been discussed with the patient and family. After consideration of risks, benefits and other options for treatment, the patient has consented to  Procedure(s): CYSTOSCOPY WITH RETROGRADE PYELOGRAM, URETEROSCOPY AND STENT PLACEMENT (Right) HOLMIUM LASER APPLICATION (Right) as a surgical intervention .  The patient's history has been reviewed, patient examined, no change in status, stable for surgery.  I have reviewed the patient's chart and labs.  Questions were answered to the patient's satisfaction.     Jorja Loa

## 2012-11-27 NOTE — Op Note (Signed)
Preoperative diagnosis: 10 mm right renal pelvic stone with smaller right calyceal stones  Postoperative diagnosis: Same   Procedure: Cystoscopy, right retrograde ureteropyelogram with interpretive fluoroscopy, flexible ureteroscopy with holmium laser fragmentation and extraction of right renal stones, placement of right double-J stent (6 x 24 cm with no string)    Surgeon: Lillette Boxer. Helem Reesor, M.D.   Anesthesia: Gen.   Complications: None  Specimen(s): Stones, the patient's husband  Drain(s): 24 cm x 6 French contour stent without string  Indications: 67 year old female recently presented with right flank pain, pyuria and radiographic evidence of a 10 mm right renal pelvic stone and smaller calyceal stones. She had renal insufficiency, most likely secondary to intermittent ureteral obstruction as well as being on anti-inflammatories. Her renal function has improved with coming off of her anti-inflammatory. She has been on antibiotics, and she's been relatively pain-free. Because her stone is fairly radiolucent, and has been intermittently symptomatic with significant right pelvocaliectasis, she presents at this time for direct visualization with ureteroscopy, holmium laser and extraction of her stones. She is aware of risks and complications and desires to proceed.    Technique and findings: The patient was properly identified and marked in the holding area. She received preoperative IV antibiotics. She was taken the operating room where general anesthetic was administered with the LMA. She was placed in the dorsolithotomy position. Genitalia and perineum were prepped and draped. Proper timeout was then performed.  A 22 French panendoscope was advanced under her bladder. The bladder was drained. Systematic visualization the bladder was performed. Urothelium was normal. There were noted tumors stones or trabeculations. The right ureteral orifice was then cannulated with the 6 open-ended  catheter. Right retrograde ureteropyelogram was then performed.  The retrograde revealed a totally normal ureter, normal in width, without evidence of filling defects. There was significant dilatation of the right renal system. There was a filling defect seen in the interpolar calyx. All calyces were fairly blown out. I then advanced the open-ended catheter to the renal pelvis. There was some purulent urine that I obtained when this was advanced to the pelvis. Urine was collected and sent for culture. I then advanced a guidewire through the open-ended catheter, removed the open-ended catheter as well as a scope, and then dilated and then placed a 15 French ureteral access sheath up to the renal pelvis which was quite easily done.  I then irrigated the renal pelvis with saline, free of some of the purulent urine. Visualization was then performed. The urothelium of the renal pelvis appeared normal. There was a fairly large stone in an interpolar calyx as well as one smaller stone. There were approximately 15 smaller stones in the lower pole calyx. Most of these were grasped. There were approximately 4 very small stones that were remaining in the lower pole calyx. These were out of reach of the scope and of the Nitinol basket. However, the large majority of the small stones were removed. I then turned my attention to the interpolar stone which was fragmented with the holmium laser using a 200  fiber. It was nicely disintegrated. The larger stone fragments were easily removed. The smaller ones were somewhat difficult to engage, and were small enough that I felt would easily pass through a dilated ureter, with the stent being used. After significant fragmentation of the stone, and extraction of the majority of the stone burden, I then removed the ureteral access sheath. Over top of the guidewire, I then placed a 24 cm, 6 [place]  double-J stent (contour), with the string removed. Good proximal and distal curls were  seen using fluoroscopic and cystoscopic guidance once a guidewire was removed. The bladder was drained and the procedure terminated.  The patient tolerated procedure well. She is awake and and taken to PACU in stable condition.

## 2012-11-27 NOTE — Anesthesia Preprocedure Evaluation (Addendum)
Anesthesia Evaluation  Patient identified by MRN, date of birth, ID band Patient awake    Reviewed: Allergy & Precautions, H&P , NPO status , Patient's Chart, lab work & pertinent test results  Airway Mallampati: II TM Distance: >3 FB Neck ROM: Full    Dental no notable dental hx.    Pulmonary PE breath sounds clear to auscultation  Pulmonary exam normal       Cardiovascular DVT Rhythm:Regular Rate:Normal     Neuro/Psych negative neurological ROS  negative psych ROS   GI/Hepatic negative GI ROS, Neg liver ROS,   Endo/Other  Hypothyroidism Morbid obesity  Renal/GU negative Renal ROS  negative genitourinary   Musculoskeletal negative musculoskeletal ROS (+)   Abdominal   Peds negative pediatric ROS (+)  Hematology negative hematology ROS (+)   Anesthesia Other Findings   Reproductive/Obstetrics negative OB ROS                           Anesthesia Physical Anesthesia Plan  ASA: III  Anesthesia Plan: General   Post-op Pain Management:    Induction: Intravenous  Airway Management Planned: LMA  Additional Equipment:   Intra-op Plan:   Post-operative Plan:   Informed Consent: I have reviewed the patients History and Physical, chart, labs and discussed the procedure including the risks, benefits and alternatives for the proposed anesthesia with the patient or authorized representative who has indicated his/her understanding and acceptance.   Dental advisory given  Plan Discussed with: CRNA  Anesthesia Plan Comments:         Anesthesia Quick Evaluation

## 2012-11-28 ENCOUNTER — Encounter (HOSPITAL_COMMUNITY): Payer: Self-pay | Admitting: Urology

## 2012-11-28 LAB — URINE CULTURE: Culture: NO GROWTH

## 2013-06-16 ENCOUNTER — Other Ambulatory Visit: Payer: Self-pay | Admitting: Internal Medicine

## 2013-06-16 ENCOUNTER — Ambulatory Visit
Admission: RE | Admit: 2013-06-16 | Discharge: 2013-06-16 | Disposition: A | Discharge status: Home / Self Care | Payer: Commercial Managed Care - HMO | Source: Ambulatory Visit | Attending: Internal Medicine | Admitting: Internal Medicine

## 2013-06-16 DIAGNOSIS — R609 Edema, unspecified: Secondary | ICD-10-CM

## 2013-12-03 ENCOUNTER — Other Ambulatory Visit (HOSPITAL_COMMUNITY): Payer: Self-pay | Admitting: Internal Medicine

## 2013-12-03 ENCOUNTER — Other Ambulatory Visit: Payer: Self-pay | Admitting: Internal Medicine

## 2013-12-03 ENCOUNTER — Other Ambulatory Visit: Payer: Medicare Other

## 2013-12-03 ENCOUNTER — Encounter (HOSPITAL_COMMUNITY): Payer: Medicare HMO

## 2013-12-03 ENCOUNTER — Ambulatory Visit (HOSPITAL_COMMUNITY)
Admission: RE | Admit: 2013-12-03 | Discharge: 2013-12-03 | Disposition: A | Discharge status: Home / Self Care | Payer: Medicare HMO | Source: Ambulatory Visit | Attending: Vascular Surgery | Admitting: Vascular Surgery

## 2013-12-03 DIAGNOSIS — R52 Pain, unspecified: Secondary | ICD-10-CM

## 2013-12-03 DIAGNOSIS — M79604 Pain in right leg: Secondary | ICD-10-CM

## 2013-12-03 DIAGNOSIS — Z86718 Personal history of other venous thrombosis and embolism: Secondary | ICD-10-CM | POA: Insufficient documentation

## 2013-12-03 DIAGNOSIS — M79609 Pain in unspecified limb: Secondary | ICD-10-CM | POA: Insufficient documentation

## 2013-12-03 DIAGNOSIS — M7989 Other specified soft tissue disorders: Secondary | ICD-10-CM | POA: Diagnosis present

## 2013-12-03 DIAGNOSIS — R609 Edema, unspecified: Secondary | ICD-10-CM

## 2014-04-13 DIAGNOSIS — Z1231 Encounter for screening mammogram for malignant neoplasm of breast: Secondary | ICD-10-CM | POA: Diagnosis not present

## 2014-04-15 DIAGNOSIS — E039 Hypothyroidism, unspecified: Secondary | ICD-10-CM | POA: Diagnosis not present

## 2014-04-15 DIAGNOSIS — I10 Essential (primary) hypertension: Secondary | ICD-10-CM | POA: Diagnosis not present

## 2014-04-20 DIAGNOSIS — J449 Chronic obstructive pulmonary disease, unspecified: Secondary | ICD-10-CM | POA: Diagnosis not present

## 2014-04-20 DIAGNOSIS — R609 Edema, unspecified: Secondary | ICD-10-CM | POA: Diagnosis not present

## 2014-04-20 DIAGNOSIS — Z23 Encounter for immunization: Secondary | ICD-10-CM | POA: Diagnosis not present

## 2014-04-20 DIAGNOSIS — I10 Essential (primary) hypertension: Secondary | ICD-10-CM | POA: Diagnosis not present

## 2014-04-20 DIAGNOSIS — E039 Hypothyroidism, unspecified: Secondary | ICD-10-CM | POA: Diagnosis not present

## 2014-04-22 DIAGNOSIS — N6002 Solitary cyst of left breast: Secondary | ICD-10-CM | POA: Diagnosis not present

## 2014-04-23 ENCOUNTER — Other Ambulatory Visit (HOSPITAL_COMMUNITY): Payer: Self-pay | Admitting: Internal Medicine

## 2014-04-23 DIAGNOSIS — R0602 Shortness of breath: Secondary | ICD-10-CM

## 2014-04-23 DIAGNOSIS — R609 Edema, unspecified: Secondary | ICD-10-CM

## 2014-04-27 ENCOUNTER — Ambulatory Visit (HOSPITAL_COMMUNITY)
Admission: RE | Admit: 2014-04-27 | Discharge: 2014-04-27 | Disposition: A | Discharge status: Home / Self Care | Payer: Commercial Managed Care - HMO | Source: Ambulatory Visit | Attending: Internal Medicine | Admitting: Internal Medicine

## 2014-04-27 DIAGNOSIS — R0602 Shortness of breath: Secondary | ICD-10-CM | POA: Diagnosis not present

## 2014-04-27 DIAGNOSIS — Z8249 Family history of ischemic heart disease and other diseases of the circulatory system: Secondary | ICD-10-CM | POA: Insufficient documentation

## 2014-04-27 DIAGNOSIS — R609 Edema, unspecified: Secondary | ICD-10-CM

## 2014-04-27 DIAGNOSIS — I10 Essential (primary) hypertension: Secondary | ICD-10-CM | POA: Diagnosis not present

## 2014-04-27 NOTE — Progress Notes (Signed)
2D Echocardiogram Complete.  04/27/2014   Deliah Boston, RDCS   Preliminary Technician Findings:  There is a small Pericardial Effusion, although there are no hemodynamic changes.  The patient does not complain of any chest pain or shortness of breath.

## 2014-04-28 ENCOUNTER — Ambulatory Visit (HOSPITAL_COMMUNITY): Payer: Self-pay

## 2014-08-10 DIAGNOSIS — J45901 Unspecified asthma with (acute) exacerbation: Secondary | ICD-10-CM | POA: Diagnosis not present

## 2014-08-10 DIAGNOSIS — R0602 Shortness of breath: Secondary | ICD-10-CM | POA: Diagnosis not present

## 2014-08-10 DIAGNOSIS — Z86711 Personal history of pulmonary embolism: Secondary | ICD-10-CM | POA: Diagnosis not present

## 2014-08-10 DIAGNOSIS — J45909 Unspecified asthma, uncomplicated: Secondary | ICD-10-CM | POA: Diagnosis not present

## 2014-08-18 DIAGNOSIS — J45909 Unspecified asthma, uncomplicated: Secondary | ICD-10-CM | POA: Diagnosis not present

## 2014-08-18 DIAGNOSIS — J189 Pneumonia, unspecified organism: Secondary | ICD-10-CM | POA: Diagnosis not present

## 2014-09-17 DIAGNOSIS — J189 Pneumonia, unspecified organism: Secondary | ICD-10-CM | POA: Diagnosis not present

## 2014-09-17 DIAGNOSIS — Z09 Encounter for follow-up examination after completed treatment for conditions other than malignant neoplasm: Secondary | ICD-10-CM | POA: Diagnosis not present

## 2014-10-19 DIAGNOSIS — Z0000 Encounter for general adult medical examination without abnormal findings: Secondary | ICD-10-CM | POA: Diagnosis not present

## 2014-10-19 DIAGNOSIS — E039 Hypothyroidism, unspecified: Secondary | ICD-10-CM | POA: Diagnosis not present

## 2014-10-19 DIAGNOSIS — M859 Disorder of bone density and structure, unspecified: Secondary | ICD-10-CM | POA: Diagnosis not present

## 2014-10-19 DIAGNOSIS — I10 Essential (primary) hypertension: Secondary | ICD-10-CM | POA: Diagnosis not present

## 2014-10-25 DIAGNOSIS — J45909 Unspecified asthma, uncomplicated: Secondary | ICD-10-CM | POA: Diagnosis not present

## 2014-10-25 DIAGNOSIS — E559 Vitamin D deficiency, unspecified: Secondary | ICD-10-CM | POA: Diagnosis not present

## 2014-10-25 DIAGNOSIS — Z0000 Encounter for general adult medical examination without abnormal findings: Secondary | ICD-10-CM | POA: Diagnosis not present

## 2014-10-25 DIAGNOSIS — I10 Essential (primary) hypertension: Secondary | ICD-10-CM | POA: Diagnosis not present

## 2014-11-02 ENCOUNTER — Encounter: Payer: Self-pay | Admitting: Internal Medicine

## 2014-11-02 ENCOUNTER — Ambulatory Visit (INDEPENDENT_AMBULATORY_CARE_PROVIDER_SITE_OTHER)
Admission: RE | Admit: 2014-11-02 | Discharge: 2014-11-02 | Disposition: A | Discharge status: Home / Self Care | Payer: Commercial Managed Care - HMO | Source: Ambulatory Visit | Attending: Internal Medicine | Admitting: Internal Medicine

## 2014-11-02 ENCOUNTER — Ambulatory Visit (INDEPENDENT_AMBULATORY_CARE_PROVIDER_SITE_OTHER): Payer: Commercial Managed Care - HMO | Admitting: Internal Medicine

## 2014-11-02 ENCOUNTER — Other Ambulatory Visit (INDEPENDENT_AMBULATORY_CARE_PROVIDER_SITE_OTHER): Payer: Commercial Managed Care - HMO

## 2014-11-02 VITALS — BP 136/70 | HR 80 | Ht 65.0 in | Wt 302.0 lb

## 2014-11-02 DIAGNOSIS — R0600 Dyspnea, unspecified: Secondary | ICD-10-CM

## 2014-11-02 DIAGNOSIS — R079 Chest pain, unspecified: Secondary | ICD-10-CM

## 2014-11-02 DIAGNOSIS — Z8262 Family history of osteoporosis: Secondary | ICD-10-CM | POA: Diagnosis not present

## 2014-11-02 DIAGNOSIS — R0602 Shortness of breath: Secondary | ICD-10-CM | POA: Diagnosis not present

## 2014-11-02 DIAGNOSIS — Z09 Encounter for follow-up examination after completed treatment for conditions other than malignant neoplasm: Secondary | ICD-10-CM | POA: Diagnosis not present

## 2014-11-02 DIAGNOSIS — N6002 Solitary cyst of left breast: Secondary | ICD-10-CM | POA: Diagnosis not present

## 2014-11-02 LAB — CBC WITH DIFFERENTIAL/PLATELET
Basophils Absolute: 0 10*3/uL (ref 0.0–0.1)
Basophils Relative: 0.2 % (ref 0.0–3.0)
EOS PCT: 0.3 % (ref 0.0–5.0)
Eosinophils Absolute: 0 10*3/uL (ref 0.0–0.7)
HCT: 41.5 % (ref 36.0–46.0)
Hemoglobin: 13.7 g/dL (ref 12.0–15.0)
LYMPHS ABS: 1.9 10*3/uL (ref 0.7–4.0)
Lymphocytes Relative: 14 % (ref 12.0–46.0)
MCHC: 33.1 g/dL (ref 30.0–36.0)
MCV: 87.7 fl (ref 78.0–100.0)
Monocytes Absolute: 0.8 10*3/uL (ref 0.1–1.0)
Monocytes Relative: 5.9 % (ref 3.0–12.0)
Neutro Abs: 10.8 10*3/uL — ABNORMAL HIGH (ref 1.4–7.7)
Neutrophils Relative %: 79.6 % — ABNORMAL HIGH (ref 43.0–77.0)
Platelets: 283 10*3/uL (ref 150.0–400.0)
RBC: 4.74 Mil/uL (ref 3.87–5.11)
RDW: 17.6 % — AB (ref 11.5–15.5)
WBC: 13.6 10*3/uL — ABNORMAL HIGH (ref 4.0–10.5)

## 2014-11-02 LAB — BASIC METABOLIC PANEL
BUN: 39 mg/dL — AB (ref 6–23)
CHLORIDE: 106 meq/L (ref 96–112)
CO2: 29 mEq/L (ref 19–32)
Calcium: 9.4 mg/dL (ref 8.4–10.5)
Creatinine, Ser: 1.38 mg/dL — ABNORMAL HIGH (ref 0.40–1.20)
GFR: 40.05 mL/min — ABNORMAL LOW (ref >60.00)
GLUCOSE: 96 mg/dL (ref 70–99)
POTASSIUM: 3.8 meq/L (ref 3.5–5.1)
Sodium: 142 mEq/L (ref 135–145)

## 2014-11-02 LAB — BRAIN NATRIURETIC PEPTIDE: Pro B Natriuretic peptide (BNP): 107 pg/mL — ABNORMAL HIGH (ref 0.0–100.0)

## 2014-11-02 LAB — TSH: TSH: 0.45 u[IU]/mL (ref 0.35–4.50)

## 2014-11-02 LAB — TROPONIN I: TNIDX: 0.01 ug/L (ref 0.00–0.06)

## 2014-11-02 NOTE — Patient Instructions (Signed)
Continue prilosec Take 30- 60 min before your first and last meals of the day   GERD (REFLUX)  is an extremely common cause of respiratory symptoms just like yours , many times with no obvious heartburn at all.    It can be treated with medication, but also with lifestyle changes including elevation of the head of your bed (ideally with 6 inch  bed blocks),  Smoking cessation, avoidance of late meals, excessive alcohol, and avoid fatty foods, chocolate, peppermint, colas, red wine, and acidic juices such as orange juice.  NO MINT OR MENTHOL PRODUCTS SO NO COUGH DROPS  USE SUGARLESS CANDY INSTEAD (Jolley ranchers or Stover's or Life Savers) or even ice chips will also do - the key is to swallow to prevent all throat clearing. NO OIL BASED VITAMINS - use powdered substitutes.   Please remember to go to the lab and x-ray department downstairs for your tests - we will call you with the results when they are available.

## 2014-11-02 NOTE — Progress Notes (Signed)
Subjective:    Patient ID: Shannon Hammond, female    DOB: 1945/04/25,    MRN: [id number]  HPI  79 yowf never smoker with PE  2012  Assoc with sob  improved and wt around 250 then gradually downhill with gain /sob x 2014 so referred to pulmonary clinic 11/02/2014 by Dr Shelia Media  11/02/2014 1st [facility] office visit/ Shannon Hammond   Chief Complaint  Patient presents with  . Pulmonary Consult    Referred by Dr Shelia Media. Pt states being dx with asthma in 2012 after blood clots were found in lungs. Pt states worsening SOB since 07-2014 when dx with PNA. Pt also states wheezing and chest discomfort   previous pft reportedly  suggested asthma > rx proair seemed to helped then stopped working in March 2016 with variable doe/ one episode of R pleuritic pc rx as pna and improved, variable neck pain almost always assoc with exertion rad to both shoulders dates back sev year episodically but more freq and severe and assoc with sob but no diaphoresis/ nausea/ some over HB.    Better breathing on saba/symbicort though hfa very poor/ better p rx with pred just completing day of ov   No obvious other patterns in day to day or daytime variabilty or assoc purulent sputum or hemoptysis , chest tightness, subjective wheeze or overt sinus  symptoms. No unusual exp hx or h/o childhood pna/ asthma or knowledge of premature birth.  Sleeping ok without nocturnal  or early am exacerbation  of respiratory  c/o's or need for noct saba. Also denies any obvious fluctuation of symptoms with weather or environmental changes or other aggravating or alleviating factors except as outlined above   Current Medications, Allergies, Complete Past Medical History, Past Surgical History, Family History, and Social History were reviewed in [employer] record.           Review of Systems  Constitutional: Negative for fever, chills and unexpected weight change.  HENT: Negative for congestion, dental problem, ear  pain, nosebleeds, postnasal drip, rhinorrhea, sinus pressure, sneezing, sore throat, trouble swallowing and voice change.   Eyes: Negative for visual disturbance.  Respiratory: Positive for cough, shortness of breath and wheezing. Negative for choking.   Cardiovascular: Positive for chest pain. Negative for leg swelling.  Gastrointestinal: Negative for vomiting, abdominal pain and diarrhea.  Genitourinary: Negative for difficulty urinating.  Musculoskeletal: Negative for arthralgias.  Skin: Negative for rash.  Neurological: Negative for tremors, syncope and headaches.  Hematological: Does not bruise/bleed easily.       Objective:   Physical Exam  Obese wf nad  Wt Readings from Last 3 Encounters:  11/02/14 302 lb (136.986 kg)  11/24/12 275 lb 12.8 oz (125.102 kg)    Vital signs reviewed   HEENT: nl dentition, turbinates, and orophanx. Nl external ear canals without cough reflex   NECK :  without JVD/Nodes/TM/ nl carotid upstrokes bilaterally   LUNGS: no acc muscle use, clear to A and P bilaterally without cough on insp or exp maneuvers   CV:  RRR  no s3 or murmur or increase in P2, trace bilateral lower ext edema sym  ABD:  soft and nontender with nl excursion in the supine position. No bruits or organomegaly, bowel sounds nl  MS:  warm without deformities, calf tenderness, cyanosis or clubbing  SKIN: warm and dry without lesions    NEURO:  alert, approp, no deficits     I personally reviewed images and agree with radiology  impression as follows:  CXR:  11/02/2014  There is no edema or consolidation. There is mild scarring in the right perihilar region, stable. Heart size and contour within normal limits. Pulmonary vascularity within normal limits. No adenopathy. There is mild degenerative change in the thoracic spine.  ekg p exercise 11/02/2014 no ischemic changes   Lab Results  Component Value Date   DDIMER 1.54* 11/02/2014     Labs ordered/ reviewed:    Lab 11/02/14 1707  NA 142  K 3.8  CL 106  CO2 29  BUN 39*  CREATININE 1.38*  GLUCOSE 96   Lab 11/02/14 1707  HGB 13.7  HCT 41.5  WBC 13.6*  PLT 283.0     Lab Results  Component Value Date   TSH 0.45 11/02/2014     Lab Results  Component Value Date   PROBNP 107.0* 11/02/2014       Assessment & Plan:

## 2014-11-03 ENCOUNTER — Encounter: Payer: Self-pay | Admitting: Internal Medicine

## 2014-11-03 ENCOUNTER — Telehealth: Payer: Self-pay | Admitting: Internal Medicine

## 2014-11-03 DIAGNOSIS — R079 Chest pain, unspecified: Secondary | ICD-10-CM | POA: Insufficient documentation

## 2014-11-03 LAB — D-DIMER, QUANTITATIVE (NOT AT ARMC): D DIMER QUANT: 1.54 ug{FEU}/mL — AB (ref 0.00–0.48)

## 2014-11-03 NOTE — Telephone Encounter (Signed)
Done > have set up for v/q dopplers and cards eval

## 2014-11-03 NOTE — Assessment & Plan Note (Signed)
Variably related to exercise with neck/ shoulder radiation since 04/2014 > referred to cardiology electively 11/02/2014  as present for sev years with minimal crescendo pattern and already on asa and beta blocker

## 2014-11-03 NOTE — Telephone Encounter (Signed)
Patient says that she received a message from Dr. Melvyn Novas that he needed to speak to her, she doesn't know why. She said she will be home all day and he can reach her at [phone]  To Dr. Melvyn Novas

## 2014-11-03 NOTE — Assessment & Plan Note (Addendum)
-   PFT's 04/09/12 FEV1 1.87 (74 % ) ratio 86  p 14 % improvement from saba with DLCO  66 % corrects to 86 % for alv volume   - reported better on symbicort but hfa < 25% 11/02/2014  - 11/02/2014  Walked RA x 2 laps @ 185 ft each stopped due to End of study slow pace, no desat  / no ekg changes but sob/ no cp or neck pain - D dimer indeterminate range 11/02/2014 >  VQ and venous dopplers ordered   Extremely complex hx with multiple patterns of cp and worsening doe but Symptoms are  disproportionate to objective findings so not clear this is all a lung problem but pt does appear to have difficult airway management issues. DDX of  difficult airways management all start with A and  include Adherence, Ace Inhibitors, Acid Reflux, Active Sinus Disease, Alpha 1 Antitripsin deficiency, Anxiety masquerading as Airways dz,  ABPA,  allergy(esp in young), Aspiration (esp in elderly), Adverse effects of meds,  Active smokers, A bunch of PE's (a small clot burden can't cause this syndrome unless there is already severe underlying pulm or vascular dz with poor reserve) plus two Bs  = Bronchiectasis and Beta blocker use..and one C= CHF   Adherence is always the initial "prime suspect" and is a multilayered concern that requires a "trust but verify" approach in every patient - starting with knowing how to use medications, especially inhalers, correctly, keeping up with refills and understanding the fundamental difference between maintenance and prns vs those medications only taken for a very short course and then stopped and not refilled.  The proper method of use, as well as anticipated side effects, of a metered-dose inhaler are discussed and demonstrated to the patient. Improved effectiveness after extensive coaching during this visit to a level of approximately  50% at best so ok to continue symbicort 160 2bid for now  ? Acid (or non-acid) GERD > always difficult to exclude as up to 75% of pts in some series report no  assoc GI/ Heartburn symptoms> rec max (24h)  acid suppression and diet restrictions/ reviewed and instructions given in writing.  ? A bunch of PE's > unlikely to be recurrent PE causing her non-pleuritic cp but can't exclude with D dimer beyond the upper limits of nl and needs vq since cri and venous dopplers  ? Allergy/ asthma > continue symbicort 160 2bid for now until sort out symptoms  ? chf > excluded by bnp but could have ischemia > rec cards evaluation (has seen [facility] so will re-refer)  I had an extended discussion with the patient reviewing all relevant studies completed to date and  lasting 77m of 80 min visit  Each maintenance medication was reviewed in detail including most importantly the difference between maintenance and prns and under what circumstances the prns are to be triggered using an action plan format that is not reflected in the computer generated alphabetically organized AVS.    Please see instructions for details which were reviewed in writing and the patient given a copy highlighting the part that I personally wrote and discussed at today's ov.

## 2014-11-10 ENCOUNTER — Ambulatory Visit (HOSPITAL_COMMUNITY)
Admission: RE | Admit: 2014-11-10 | Discharge: 2014-11-10 | Disposition: A | Discharge status: Home / Self Care | Payer: Commercial Managed Care - HMO | Source: Ambulatory Visit | Attending: Internal Medicine | Admitting: Internal Medicine

## 2014-11-10 ENCOUNTER — Ambulatory Visit (HOSPITAL_BASED_OUTPATIENT_CLINIC_OR_DEPARTMENT_OTHER)
Admission: RE | Admit: 2014-11-10 | Discharge: 2014-11-10 | Disposition: A | Discharge status: Home / Self Care | Payer: Commercial Managed Care - HMO | Source: Ambulatory Visit | Attending: Internal Medicine | Admitting: Internal Medicine

## 2014-11-10 ENCOUNTER — Telehealth: Payer: Self-pay | Admitting: Internal Medicine

## 2014-11-10 ENCOUNTER — Ambulatory Visit (HOSPITAL_COMMUNITY): Payer: Commercial Managed Care - HMO

## 2014-11-10 DIAGNOSIS — R079 Chest pain, unspecified: Secondary | ICD-10-CM | POA: Diagnosis not present

## 2014-11-10 DIAGNOSIS — J45909 Unspecified asthma, uncomplicated: Secondary | ICD-10-CM | POA: Diagnosis not present

## 2014-11-10 DIAGNOSIS — Z86718 Personal history of other venous thrombosis and embolism: Secondary | ICD-10-CM | POA: Diagnosis not present

## 2014-11-10 DIAGNOSIS — R0600 Dyspnea, unspecified: Secondary | ICD-10-CM

## 2014-11-10 DIAGNOSIS — R0602 Shortness of breath: Secondary | ICD-10-CM | POA: Diagnosis not present

## 2014-11-10 DIAGNOSIS — Z86711 Personal history of pulmonary embolism: Secondary | ICD-10-CM | POA: Diagnosis not present

## 2014-11-10 MED ORDER — TECHNETIUM TO 99M ALBUMIN AGGREGATED
5.9000 | Freq: Once | INTRAVENOUS | Status: AC | PRN
  Administered 2014-11-10: 6 via INTRAVENOUS

## 2014-11-10 MED ORDER — TECHNETIUM TC 99M DIETHYLENETRIAME-PENTAACETIC ACID: 39.9000 | Freq: Once | INTRAVENOUS | Status: DC | PRN

## 2014-11-10 NOTE — Telephone Encounter (Signed)
Venous Doppler - negative for DVT  FYI - Dr. Melvyn Novas

## 2014-11-10 NOTE — Progress Notes (Signed)
*  PRELIMINARY RESULTS* Vascular Ultrasound Lower extremity venous duplex has been completed.  Preliminary findings: Negative for DVT.   Called results to [facility].   Landry Mellow, RDMS, RVT  11/10/2014, 10:46 AM

## 2014-11-11 NOTE — Progress Notes (Signed)
Quick Note:  Spoke with pt and notified of results per Dr. Wert. Pt verbalized understanding and denied any questions.  ______ 

## 2014-12-27 DIAGNOSIS — E559 Vitamin D deficiency, unspecified: Secondary | ICD-10-CM | POA: Diagnosis not present

## 2015-02-22 DIAGNOSIS — H43811 Vitreous degeneration, right eye: Secondary | ICD-10-CM | POA: Diagnosis not present

## 2015-02-22 DIAGNOSIS — H25812 Combined forms of age-related cataract, left eye: Secondary | ICD-10-CM | POA: Diagnosis not present

## 2015-02-22 DIAGNOSIS — H10413 Chronic giant papillary conjunctivitis, bilateral: Secondary | ICD-10-CM | POA: Diagnosis not present

## 2015-02-22 DIAGNOSIS — H2511 Age-related nuclear cataract, right eye: Secondary | ICD-10-CM | POA: Diagnosis not present

## 2015-04-26 DIAGNOSIS — N63 Unspecified lump in breast: Secondary | ICD-10-CM | POA: Diagnosis not present

## 2015-04-29 DIAGNOSIS — E559 Vitamin D deficiency, unspecified: Secondary | ICD-10-CM | POA: Diagnosis not present

## 2015-04-29 DIAGNOSIS — E039 Hypothyroidism, unspecified: Secondary | ICD-10-CM | POA: Diagnosis not present

## 2015-04-29 DIAGNOSIS — J4520 Mild intermittent asthma, uncomplicated: Secondary | ICD-10-CM | POA: Diagnosis not present

## 2015-04-29 DIAGNOSIS — I10 Essential (primary) hypertension: Secondary | ICD-10-CM | POA: Diagnosis not present

## 2015-09-26 DIAGNOSIS — M79672 Pain in left foot: Secondary | ICD-10-CM | POA: Diagnosis not present

## 2015-09-26 DIAGNOSIS — K219 Gastro-esophageal reflux disease without esophagitis: Secondary | ICD-10-CM | POA: Diagnosis not present

## 2015-09-26 DIAGNOSIS — M19072 Primary osteoarthritis, left ankle and foot: Secondary | ICD-10-CM | POA: Diagnosis not present

## 2015-10-12 DIAGNOSIS — M25562 Pain in left knee: Secondary | ICD-10-CM | POA: Diagnosis not present

## 2015-10-12 DIAGNOSIS — M25561 Pain in right knee: Secondary | ICD-10-CM | POA: Diagnosis not present

## 2015-11-11 DIAGNOSIS — I10 Essential (primary) hypertension: Secondary | ICD-10-CM | POA: Diagnosis not present

## 2015-11-11 DIAGNOSIS — E039 Hypothyroidism, unspecified: Secondary | ICD-10-CM | POA: Diagnosis not present

## 2015-11-11 DIAGNOSIS — Z0000 Encounter for general adult medical examination without abnormal findings: Secondary | ICD-10-CM | POA: Diagnosis not present

## 2015-11-11 DIAGNOSIS — E559 Vitamin D deficiency, unspecified: Secondary | ICD-10-CM | POA: Diagnosis not present

## 2015-11-16 DIAGNOSIS — R0602 Shortness of breath: Secondary | ICD-10-CM | POA: Diagnosis not present

## 2015-11-16 DIAGNOSIS — R0609 Other forms of dyspnea: Secondary | ICD-10-CM | POA: Diagnosis not present

## 2015-11-16 DIAGNOSIS — Z23 Encounter for immunization: Secondary | ICD-10-CM | POA: Diagnosis not present

## 2015-11-16 DIAGNOSIS — K5901 Slow transit constipation: Secondary | ICD-10-CM | POA: Diagnosis not present

## 2015-11-16 DIAGNOSIS — Z0001 Encounter for general adult medical examination with abnormal findings: Secondary | ICD-10-CM | POA: Diagnosis not present

## 2015-11-29 DIAGNOSIS — N183 Chronic kidney disease, stage 3 (moderate): Secondary | ICD-10-CM | POA: Diagnosis not present

## 2015-11-29 DIAGNOSIS — M810 Age-related osteoporosis without current pathological fracture: Secondary | ICD-10-CM | POA: Diagnosis not present

## 2015-11-29 DIAGNOSIS — R0609 Other forms of dyspnea: Secondary | ICD-10-CM | POA: Diagnosis not present

## 2015-11-29 DIAGNOSIS — I519 Heart disease, unspecified: Secondary | ICD-10-CM | POA: Diagnosis not present

## 2015-11-29 DIAGNOSIS — R252 Cramp and spasm: Secondary | ICD-10-CM | POA: Diagnosis not present

## 2015-12-22 DIAGNOSIS — R0609 Other forms of dyspnea: Secondary | ICD-10-CM | POA: Diagnosis not present

## 2015-12-22 DIAGNOSIS — J439 Emphysema, unspecified: Secondary | ICD-10-CM | POA: Diagnosis not present

## 2015-12-22 DIAGNOSIS — R0789 Other chest pain: Secondary | ICD-10-CM | POA: Diagnosis not present

## 2015-12-22 DIAGNOSIS — I10 Essential (primary) hypertension: Secondary | ICD-10-CM | POA: Diagnosis not present

## 2015-12-23 DIAGNOSIS — R252 Cramp and spasm: Secondary | ICD-10-CM | POA: Diagnosis not present

## 2015-12-23 DIAGNOSIS — N183 Chronic kidney disease, stage 3 (moderate): Secondary | ICD-10-CM | POA: Diagnosis not present

## 2015-12-28 ENCOUNTER — Ambulatory Visit (INDEPENDENT_AMBULATORY_CARE_PROVIDER_SITE_OTHER): Payer: Commercial Managed Care - HMO | Admitting: Orthopaedic Surgery

## 2015-12-28 DIAGNOSIS — M25561 Pain in right knee: Secondary | ICD-10-CM | POA: Diagnosis not present

## 2015-12-28 DIAGNOSIS — M25562 Pain in left knee: Secondary | ICD-10-CM | POA: Diagnosis not present

## 2015-12-30 DIAGNOSIS — R9431 Abnormal electrocardiogram [ECG] [EKG]: Secondary | ICD-10-CM | POA: Diagnosis not present

## 2015-12-30 DIAGNOSIS — R0602 Shortness of breath: Secondary | ICD-10-CM | POA: Diagnosis not present

## 2015-12-30 DIAGNOSIS — R0789 Other chest pain: Secondary | ICD-10-CM | POA: Diagnosis not present

## 2016-01-05 DIAGNOSIS — Z0000 Encounter for general adult medical examination without abnormal findings: Secondary | ICD-10-CM | POA: Diagnosis not present

## 2016-01-12 DIAGNOSIS — R0602 Shortness of breath: Secondary | ICD-10-CM | POA: Diagnosis not present

## 2016-01-12 DIAGNOSIS — R9431 Abnormal electrocardiogram [ECG] [EKG]: Secondary | ICD-10-CM | POA: Diagnosis not present

## 2016-01-12 DIAGNOSIS — I10 Essential (primary) hypertension: Secondary | ICD-10-CM | POA: Diagnosis not present

## 2016-01-12 DIAGNOSIS — R0789 Other chest pain: Secondary | ICD-10-CM | POA: Diagnosis not present

## 2016-01-19 DIAGNOSIS — I10 Essential (primary) hypertension: Secondary | ICD-10-CM | POA: Diagnosis not present

## 2016-01-19 DIAGNOSIS — R0789 Other chest pain: Secondary | ICD-10-CM | POA: Diagnosis not present

## 2016-01-19 DIAGNOSIS — R0609 Other forms of dyspnea: Secondary | ICD-10-CM | POA: Diagnosis not present

## 2016-01-19 DIAGNOSIS — J439 Emphysema, unspecified: Secondary | ICD-10-CM | POA: Diagnosis not present

## 2016-01-25 ENCOUNTER — Ambulatory Visit (INDEPENDENT_AMBULATORY_CARE_PROVIDER_SITE_OTHER): Payer: Commercial Managed Care - HMO | Admitting: Orthopaedic Surgery

## 2016-02-02 ENCOUNTER — Ambulatory Visit (INDEPENDENT_AMBULATORY_CARE_PROVIDER_SITE_OTHER): Payer: Commercial Managed Care - HMO | Admitting: Orthopaedic Surgery

## 2016-02-02 DIAGNOSIS — M25562 Pain in left knee: Secondary | ICD-10-CM

## 2016-02-02 DIAGNOSIS — M1712 Unilateral primary osteoarthritis, left knee: Secondary | ICD-10-CM | POA: Diagnosis not present

## 2016-02-02 DIAGNOSIS — M25561 Pain in right knee: Secondary | ICD-10-CM

## 2016-02-02 DIAGNOSIS — L089 Local infection of the skin and subcutaneous tissue, unspecified: Secondary | ICD-10-CM | POA: Diagnosis not present

## 2016-02-02 DIAGNOSIS — S90424A Blister (nonthermal), right lesser toe(s), initial encounter: Secondary | ICD-10-CM

## 2016-02-02 DIAGNOSIS — M1711 Unilateral primary osteoarthritis, right knee: Secondary | ICD-10-CM

## 2016-02-02 DIAGNOSIS — G8929 Other chronic pain: Secondary | ICD-10-CM | POA: Diagnosis not present

## 2016-02-02 MED ORDER — DOXYCYCLINE HYCLATE 100 MG PO CAPS: 100.0000 mg | ORAL_CAPSULE | Freq: Two times a day (BID) | ORAL | 0 refills | Status: DC

## 2016-02-02 MED ORDER — MUPIROCIN 2 % EX OINT: 1.0000 "application " | TOPICAL_OINTMENT | Freq: Every day | CUTANEOUS | 0 refills | Status: DC

## 2016-02-02 MED ORDER — MELOXICAM 7.5 MG PO TABS: 7.5000 mg | ORAL_TABLET | Freq: Two times a day (BID) | ORAL | 0 refills | Status: DC | PRN

## 2016-02-02 NOTE — Progress Notes (Signed)
Office Visit Note   Patient: Shannon Hammond           Date of Birth: [birth year]           MRN: [id number] Visit Date: 02/02/2016              Requested by: Deland Pretty, MD [address] [place], [place] [address] PCP: Horatio Pel, MD   Assessment & Plan: Visit Diagnoses:  1. Chronic pain of left knee   2. Chronic pain of right knee   3. Unilateral primary osteoarthritis, left knee   4. Unilateral primary osteoarthritis, right knee   5. Blister of toe of right foot with infection, initial encounter     Plan:  See note dictated just a minute ago.  Follow-Up Instructions: Return in about 2 weeks (around 02/16/2016).   Orders:  No orders of the defined types were placed in this encounter.  Meds ordered this encounter  Medications  . doxycycline (VIBRAMYCIN) 100 MG capsule    Sig: Take 1 capsule (100 mg total) by mouth 2 (two) times daily.    Dispense:  20 capsule    Refill:  0  . mupirocin ointment (BACTROBAN) 2 %    Sig: Apply 1 application topically daily.    Dispense:  22 g    Refill:  0  . meloxicam (MOBIC) 7.5 MG tablet    Sig: Take 1 tablet (7.5 mg total) by mouth 2 (two) times daily between meals as needed for pain.    Dispense:  180 tablet    Refill:  0      Procedures: No procedures performed   Clinical Data: No additional findings.   Subjective: Chief Complaint  Patient presents with  . Right Knee - Follow-up    Bilateral knee injections 12/28/15. States they did ok.   . Left Knee - Follow-up  . Right Foot - Pain    Patient having redness, blister, swelling on 3rd right toe.    HPI  Review of Systems   Objective: Vital Signs: There were no vitals taken for this visit.  Physical Exam  Ortho Exam  Specialty Comments:  No specialty comments available.  Imaging: No results found.   PMFS History: Patient Active Problem List   Diagnosis Date Noted  . Chest pain 11/03/2014  . Dyspnea 11/02/2014   Past  Medical History:  Diagnosis Date  . Anginal pain   . Arthritis    BACK AND KNEES  . DVT (deep venous thrombosis) 2012   RIGHT LEG  . GERD (gastroesophageal reflux disease)   . Heart murmur   . Hemorrhoids   . History of kidney stones   . History of nocturia   . Hypothyroidism   . PE (pulmonary embolism)    HX PE 2012 - TX'D IN [facility] [place]- HOSPITALIZED X 1 WEEK.   NO LONGER ON BLOOD THINNER OTHER THAN 81 MG ASPIRIN  . Seizures 2007   ONLY ONCE - THOUGHT TO BE STRESS INDUCED - PT WAS DEALING WITH HER MOTHER'S DEATH  . Shortness of breath    PT TOLD BY HER MEDICAL DOCTOR - SOME LUNG CHANGES DUE TO 2ND HAND SMOKE - SHE WAS GIVEN INHALER TO USE AS NEEDED.    No family history on file.  Past Surgical History:  Procedure Laterality Date  . BREAST SURGERY  1993   CYST REMOVED LEFT BREAST  . CHOLECYSTECTOMY  LATE 1990'S  . CYSTOSCOPY WITH RETROGRADE PYELOGRAM, URETEROSCOPY AND STENT PLACEMENT Right 11/27/2012  Procedure: CYSTOSCOPY WITH RETROGRADE PYELOGRAM, URETEROSCOPY, stone basketry AND STENT PLACEMENT;  Surgeon: Franchot Gallo, MD;  Location: [facility];  Service: Urology;  Laterality: Right;  . HOLMIUM LASER APPLICATION Right 1/48/4039   Procedure: HOLMIUM LASER APPLICATION;  Surgeon: Franchot Gallo, MD;  Location: [facility];  Service: Urology;  Laterality: Right;  . LITHOTRIPSY     BOTH KIDNEYS   Social History   Occupational History  . Not on file.   Social History Main Topics  . Smoking status: Never Smoker  . Smokeless tobacco: Never Used  . Alcohol use No  . Drug use: No  . Sexual activity: Not on file

## 2016-02-02 NOTE — Progress Notes (Signed)
Office Visit Note   Patient: Shannon Hammond           Date of Birth: 05-01-1945           MRN: [id number] Visit Date: 02/02/2016              Requested by: Deland Pretty, MD [address] [place], [place] [address] PCP: Horatio Pel, MD   Assessment & Plan: Visit Diagnoses:  1. Chronic pain of left knee   2. Chronic pain of right knee   3. Unilateral primary osteoarthritis, left knee   4. Unilateral primary osteoarthritis, right knee   5. Blister of toe of right foot with infection, initial encounter     Plan: She reports that her right third toe has been getting better with soaking it daily and absence I'll's. I do feel that she'll benefit from an antibiotic orally as well as actually been ointment. Also order to put cotton between her toes and to try to keep everything else drive. I would see her back in 2 weeks and at that visit we'll also place a hyaluronic acid injection in both of her knees. This is to treat her chronic moderate arthritis  Follow-Up Instructions: Return in about 2 weeks (around 02/16/2016).   Orders:  No orders of the defined types were placed in this encounter.  Meds ordered this encounter  Medications  . doxycycline (VIBRAMYCIN) 100 MG capsule    Sig: Take 1 capsule (100 mg total) by mouth 2 (two) times daily.    Dispense:  20 capsule    Refill:  0  . mupirocin ointment (BACTROBAN) 2 %    Sig: Apply 1 application topically daily.    Dispense:  22 g    Refill:  0  . meloxicam (MOBIC) 7.5 MG tablet    Sig: Take 1 tablet (7.5 mg total) by mouth 2 (two) times daily between meals as needed for pain.    Dispense:  180 tablet    Refill:  0      Procedures: No procedures performed   Clinical Data: No additional findings.   Subjective: Chief Complaint  Patient presents with  . Right Knee - Follow-up    Bilateral knee injections 12/28/15. States they did ok.   . Left Knee - Follow-up  . Right Foot - Pain    Patient  having redness, blister, swelling on 3rd right toe.    HPI She has done well's since her bilateral steroid injections in her knees. She does want to consider hyaluronic acid in the future but needed to me to look at her right foot third toe today. Review of Systems Negative for fever and chills.  Objective: Vital Signs: There were no vitals taken for this visit.  Physical Exam He is alert and oriented 3. Ortho Exam Both knees have good range of motion with slight varus deformity and medial joint line tenderness but no significant effusion. There is patellofemoral crepitation of both knees.  Examination of her right foot third toe shows blistering and some redness and no open exposed tendons or bone. Of note she is not a diabetic. Specialty Comments:  No specialty comments available.  Imaging: No results found.   PMFS History: Patient Active Problem List   Diagnosis Date Noted  . Chest pain 11/03/2014  . Dyspnea 11/02/2014   Past Medical History:  Diagnosis Date  . Anginal pain   . Arthritis    BACK AND KNEES  . DVT (deep venous thrombosis)  2012   RIGHT LEG  . GERD (gastroesophageal reflux disease)   . Heart murmur   . Hemorrhoids   . History of kidney stones   . History of nocturia   . Hypothyroidism   . PE (pulmonary embolism)    HX PE 2012 - TX'D IN [facility]- HOSPITALIZED X 1 WEEK.   NO LONGER ON BLOOD THINNER OTHER THAN 81 MG ASPIRIN  . Seizures 2007   ONLY ONCE - THOUGHT TO BE STRESS INDUCED - PT WAS DEALING WITH HER MOTHER'S DEATH  . Shortness of breath    PT TOLD BY HER MEDICAL DOCTOR - SOME LUNG CHANGES DUE TO 2ND HAND SMOKE - SHE WAS GIVEN INHALER TO USE AS NEEDED.    No family history on file.  Past Surgical History:  Procedure Laterality Date  . BREAST SURGERY  1993   CYST REMOVED LEFT BREAST  . CHOLECYSTECTOMY  LATE 1990'S  . CYSTOSCOPY WITH RETROGRADE PYELOGRAM, URETEROSCOPY AND STENT PLACEMENT Right 11/27/2012   Procedure: CYSTOSCOPY WITH  RETROGRADE PYELOGRAM, URETEROSCOPY, stone basketry AND STENT PLACEMENT;  Surgeon: Franchot Gallo, MD;  Location: [facility];  Service: Urology;  Laterality: Right;  . HOLMIUM LASER APPLICATION Right 7/59/1638   Procedure: HOLMIUM LASER APPLICATION;  Surgeon: Franchot Gallo, MD;  Location: [facility];  Service: Urology;  Laterality: Right;  . LITHOTRIPSY     BOTH KIDNEYS   Social History   Occupational History  . Not on file.   Social History Main Topics  . Smoking status: Never Smoker  . Smokeless tobacco: Never Used  . Alcohol use No  . Drug use: No  . Sexual activity: Not on file

## 2016-02-16 ENCOUNTER — Ambulatory Visit (INDEPENDENT_AMBULATORY_CARE_PROVIDER_SITE_OTHER): Payer: Commercial Managed Care - HMO | Admitting: Physician Assistant

## 2016-02-20 ENCOUNTER — Ambulatory Visit (INDEPENDENT_AMBULATORY_CARE_PROVIDER_SITE_OTHER): Payer: Commercial Managed Care - HMO | Admitting: Physician Assistant

## 2016-02-27 ENCOUNTER — Encounter (INDEPENDENT_AMBULATORY_CARE_PROVIDER_SITE_OTHER): Payer: Self-pay | Admitting: Physician Assistant

## 2016-02-27 ENCOUNTER — Encounter (INDEPENDENT_AMBULATORY_CARE_PROVIDER_SITE_OTHER): Payer: Self-pay

## 2016-02-27 ENCOUNTER — Ambulatory Visit (INDEPENDENT_AMBULATORY_CARE_PROVIDER_SITE_OTHER): Payer: Commercial Managed Care - HMO | Admitting: Physician Assistant

## 2016-02-27 VITALS — Ht 66.0 in | Wt 278.0 lb

## 2016-02-27 DIAGNOSIS — S90821S Blister (nonthermal), right foot, sequela: Secondary | ICD-10-CM | POA: Diagnosis not present

## 2016-02-27 DIAGNOSIS — M1712 Unilateral primary osteoarthritis, left knee: Secondary | ICD-10-CM | POA: Diagnosis not present

## 2016-02-27 DIAGNOSIS — M1711 Unilateral primary osteoarthritis, right knee: Secondary | ICD-10-CM | POA: Diagnosis not present

## 2016-02-27 MED ORDER — HYALURONAN 88 MG/4ML IX SOSY
88.0000 mg | PREFILLED_SYRINGE | INTRA_ARTICULAR | Status: AC | PRN
  Administered 2016-02-27: 88 mg via INTRA_ARTICULAR

## 2016-02-27 NOTE — Progress Notes (Signed)
Office Visit Note   Patient: Shannon Hammond           Date of Birth: [birth year]           MRN: [id number] Visit Date: 02/27/2016              Requested by: Deland Pretty, MD [address] [place], [place] [address] PCP: Horatio Pel, MD   Assessment & Plan: Visit Diagnoses: No diagnosis found.  Plan: Discussed with her soaking her feet in antibacterial soap using a pumice stone or padded a get to file down the calluses. In regards to her knees so we'll check her back in 2 months to see  how much relief she is sustained with the Monovisc injections.  Follow-Up Instructions: Return in about 2 months (around 04/29/2016).   Orders:  No orders of the defined types were placed in this encounter.  No orders of the defined types were placed in this encounter.     Procedures: Large Joint Inj Date/Time: 02/27/2016 11:41 AM Performed by: Pete Pelt Authorized by: Pete Pelt   Consent Given by:  Patient Indications:  Pain Location:  Knee Site:  R knee Needle Size:  22 G Needle Length:  1.5 inches Approach:  Anterolateral Ultrasound Guidance: No   Fluoroscopic Guidance: No   Arthrogram: No   Medications:  88 mg Hyaluronan 88 MG/4ML Aspiration Attempted: No   Patient tolerance:  Patient tolerated the procedure well with no immediate complications Large Joint Inj Date/Time: 02/27/2016 11:42 AM Performed by: Pete Pelt Authorized by: Pete Pelt   Location:  Knee Site:  L knee Needle Size:  22 G Needle Length:  1.5 inches Approach:  Anterolateral Ultrasound Guidance: No   Fluoroscopic Guidance: No   Arthrogram: No   Medications:  88 mg Hyaluronan 88 MG/4ML Aspiration Attempted: No   Patient tolerance:  Patient tolerated the procedure well with no immediate complications     Clinical Data: No additional findings.   Subjective: Chief Complaint  Patient presents with  . Right Knee - Pain  . Left Knee - Pain  . Right  Foot - Pain    Patient is here today for Monovisc injections in bilateral knees. States her knees are sore. Left knee has been catching and giving out on me. Having a lot of cramps lately and swelling in bilateral legs. Also here to recheck right 2nd and 3rd toe. Using ointment daily. States she hasn't soaked her foot in few days. Had some bleeding this morning.     Review of Systems   Objective: Vital Signs: Ht 5\' 6"  (1.676 m)   Wt 278 lb (126.1 kg)   BMI 44.87 kg/m   Physical Exam  Ortho Exam Bilateral knees with overall good range of motion. No effusion no abnormal warmth noted erythema Right foot healing abrasion over the dorsal aspect the second toe no signs of infection or Webspace area there is an area callus formation/scab without any signs of infection . Specialty Comments:  No specialty comments available.  Imaging: No results found.   PMFS History: Patient Active Problem List   Diagnosis Date Noted  . Chest pain 11/03/2014  . Dyspnea 11/02/2014   Past Medical History:  Diagnosis Date  . Anginal pain ([place])   . Arthritis    BACK AND KNEES  . DVT (deep venous thrombosis) ([place]) 2012   RIGHT LEG  . GERD (gastroesophageal reflux disease)   . Heart murmur   . Hemorrhoids   .  History of kidney stones   . History of nocturia   . Hypothyroidism   . PE (pulmonary embolism)    HX PE 2012 - TX'D IN [facility]- HOSPITALIZED X 1 WEEK.   NO LONGER ON BLOOD THINNER OTHER THAN 81 MG ASPIRIN  . Seizures ([place]) 2007   ONLY ONCE - THOUGHT TO BE STRESS INDUCED - PT WAS DEALING WITH HER MOTHER'S DEATH  . Shortness of breath    PT TOLD BY HER MEDICAL DOCTOR - SOME LUNG CHANGES DUE TO 2ND HAND SMOKE - SHE WAS GIVEN INHALER TO USE AS NEEDED.    No family history on file.  Past Surgical History:  Procedure Laterality Date  . BREAST SURGERY  1993   CYST REMOVED LEFT BREAST  . CHOLECYSTECTOMY  LATE 1990'S  . CYSTOSCOPY WITH RETROGRADE PYELOGRAM, URETEROSCOPY AND STENT  PLACEMENT Right 11/27/2012   Procedure: CYSTOSCOPY WITH RETROGRADE PYELOGRAM, URETEROSCOPY, stone basketry AND STENT PLACEMENT;  Surgeon: Franchot Gallo, MD;  Location: [facility];  Service: Urology;  Laterality: Right;  . HOLMIUM LASER APPLICATION Right 04/15/1186   Procedure: HOLMIUM LASER APPLICATION;  Surgeon: Franchot Gallo, MD;  Location: [facility];  Service: Urology;  Laterality: Right;  . LITHOTRIPSY     BOTH KIDNEYS   Social History   Occupational History  . Not on file.   Social History Main Topics  . Smoking status: Never Smoker  . Smokeless tobacco: Never Used  . Alcohol use No  . Drug use: No  . Sexual activity: Not on file

## 2016-02-28 ENCOUNTER — Emergency Department (HOSPITAL_COMMUNITY): Payer: Commercial Managed Care - HMO

## 2016-02-28 ENCOUNTER — Inpatient Hospital Stay (HOSPITAL_COMMUNITY): Payer: Commercial Managed Care - HMO

## 2016-02-28 ENCOUNTER — Inpatient Hospital Stay (HOSPITAL_COMMUNITY)
Admission: EM | Admit: 2016-02-28 | Discharge: 2016-03-02 | DRG: 176 | Disposition: A | Discharge status: Home / Self Care | Payer: Commercial Managed Care - HMO | Attending: Internal Medicine | Admitting: Internal Medicine

## 2016-02-28 ENCOUNTER — Encounter (HOSPITAL_COMMUNITY): Payer: Self-pay | Admitting: Emergency Medicine

## 2016-02-28 DIAGNOSIS — K219 Gastro-esophageal reflux disease without esophagitis: Secondary | ICD-10-CM | POA: Diagnosis present

## 2016-02-28 DIAGNOSIS — N184 Chronic kidney disease, stage 4 (severe): Secondary | ICD-10-CM | POA: Diagnosis not present

## 2016-02-28 DIAGNOSIS — R072 Precordial pain: Secondary | ICD-10-CM | POA: Diagnosis present

## 2016-02-28 DIAGNOSIS — Z6841 Body Mass Index (BMI) 40.0 and over, adult: Secondary | ICD-10-CM

## 2016-02-28 DIAGNOSIS — I272 Pulmonary hypertension, unspecified: Secondary | ICD-10-CM | POA: Diagnosis not present

## 2016-02-28 DIAGNOSIS — R0602 Shortness of breath: Secondary | ICD-10-CM | POA: Diagnosis present

## 2016-02-28 DIAGNOSIS — I82401 Acute embolism and thrombosis of unspecified deep veins of right lower extremity: Secondary | ICD-10-CM | POA: Diagnosis not present

## 2016-02-28 DIAGNOSIS — R7989 Other specified abnormal findings of blood chemistry: Secondary | ICD-10-CM

## 2016-02-28 DIAGNOSIS — I480 Paroxysmal atrial fibrillation: Secondary | ICD-10-CM | POA: Diagnosis not present

## 2016-02-28 DIAGNOSIS — Z7951 Long term (current) use of inhaled steroids: Secondary | ICD-10-CM

## 2016-02-28 DIAGNOSIS — Z87442 Personal history of urinary calculi: Secondary | ICD-10-CM

## 2016-02-28 DIAGNOSIS — Z79899 Other long term (current) drug therapy: Secondary | ICD-10-CM

## 2016-02-28 DIAGNOSIS — N289 Disorder of kidney and ureter, unspecified: Secondary | ICD-10-CM | POA: Diagnosis not present

## 2016-02-28 DIAGNOSIS — R0789 Other chest pain: Secondary | ICD-10-CM | POA: Diagnosis not present

## 2016-02-28 DIAGNOSIS — Z7722 Contact with and (suspected) exposure to environmental tobacco smoke (acute) (chronic): Secondary | ICD-10-CM | POA: Diagnosis present

## 2016-02-28 DIAGNOSIS — Z791 Long term (current) use of non-steroidal anti-inflammatories (NSAID): Secondary | ICD-10-CM

## 2016-02-28 DIAGNOSIS — I2699 Other pulmonary embolism without acute cor pulmonale: Secondary | ICD-10-CM | POA: Diagnosis not present

## 2016-02-28 DIAGNOSIS — Z8249 Family history of ischemic heart disease and other diseases of the circulatory system: Secondary | ICD-10-CM

## 2016-02-28 DIAGNOSIS — I5030 Unspecified diastolic (congestive) heart failure: Secondary | ICD-10-CM | POA: Diagnosis present

## 2016-02-28 DIAGNOSIS — N183 Chronic kidney disease, stage 3 unspecified: Secondary | ICD-10-CM

## 2016-02-28 DIAGNOSIS — I130 Hypertensive heart and chronic kidney disease with heart failure and stage 1 through stage 4 chronic kidney disease, or unspecified chronic kidney disease: Secondary | ICD-10-CM | POA: Diagnosis present

## 2016-02-28 DIAGNOSIS — I50810 Right heart failure, unspecified: Secondary | ICD-10-CM

## 2016-02-28 DIAGNOSIS — Z7982 Long term (current) use of aspirin: Secondary | ICD-10-CM

## 2016-02-28 DIAGNOSIS — I340 Nonrheumatic mitral (valve) insufficiency: Secondary | ICD-10-CM | POA: Diagnosis present

## 2016-02-28 DIAGNOSIS — R079 Chest pain, unspecified: Secondary | ICD-10-CM

## 2016-02-28 DIAGNOSIS — Z86711 Personal history of pulmonary embolism: Secondary | ICD-10-CM | POA: Diagnosis not present

## 2016-02-28 DIAGNOSIS — E039 Hypothyroidism, unspecified: Secondary | ICD-10-CM | POA: Diagnosis present

## 2016-02-28 DIAGNOSIS — R791 Abnormal coagulation profile: Secondary | ICD-10-CM | POA: Diagnosis not present

## 2016-02-28 DIAGNOSIS — I80221 Phlebitis and thrombophlebitis of right popliteal vein: Secondary | ICD-10-CM | POA: Diagnosis not present

## 2016-02-28 DIAGNOSIS — I071 Rheumatic tricuspid insufficiency: Secondary | ICD-10-CM | POA: Diagnosis not present

## 2016-02-28 DIAGNOSIS — Z86718 Personal history of other venous thrombosis and embolism: Secondary | ICD-10-CM | POA: Diagnosis not present

## 2016-02-28 DIAGNOSIS — R011 Cardiac murmur, unspecified: Secondary | ICD-10-CM | POA: Diagnosis present

## 2016-02-28 DIAGNOSIS — I50811 Acute right heart failure: Secondary | ICD-10-CM | POA: Diagnosis not present

## 2016-02-28 DIAGNOSIS — Z9049 Acquired absence of other specified parts of digestive tract: Secondary | ICD-10-CM | POA: Diagnosis not present

## 2016-02-28 DIAGNOSIS — I471 Supraventricular tachycardia: Secondary | ICD-10-CM | POA: Diagnosis not present

## 2016-02-28 DIAGNOSIS — R569 Unspecified convulsions: Secondary | ICD-10-CM | POA: Diagnosis present

## 2016-02-28 DIAGNOSIS — I2609 Other pulmonary embolism with acute cor pulmonale: Secondary | ICD-10-CM | POA: Diagnosis not present

## 2016-02-28 DIAGNOSIS — I454 Nonspecific intraventricular block: Secondary | ICD-10-CM | POA: Diagnosis present

## 2016-02-28 LAB — BASIC METABOLIC PANEL
Anion gap: 5 (ref 5–15)
BUN: 27 mg/dL — AB (ref 6–20)
CALCIUM: 10 mg/dL (ref 8.9–10.3)
CO2: 25 mmol/L (ref 22–32)
CREATININE: 1.69 mg/dL — AB (ref 0.44–1.00)
Chloride: 108 mmol/L (ref 101–111)
GFR calc Af Amer: 34 mL/min — ABNORMAL LOW (ref >60)
GFR, EST NON AFRICAN AMERICAN: 29 mL/min — AB (ref >60)
GLUCOSE: 103 mg/dL — AB (ref 65–99)
Potassium: 4.6 mmol/L (ref 3.5–5.1)
SODIUM: 138 mmol/L (ref 135–145)

## 2016-02-28 LAB — CBC
HEMATOCRIT: 40.6 % (ref 36.0–46.0)
Hemoglobin: 13.5 g/dL (ref 12.0–15.0)
MCH: 29.9 pg (ref 26.0–34.0)
MCHC: 33.3 g/dL (ref 30.0–36.0)
MCV: 89.8 fL (ref 78.0–100.0)
PLATELETS: 214 10*3/uL (ref 150–400)
RBC: 4.52 MIL/uL (ref 3.87–5.11)
RDW: 15 % (ref 11.5–15.5)
WBC: 11.8 10*3/uL — ABNORMAL HIGH (ref 4.0–10.5)

## 2016-02-28 LAB — TROPONIN I: TROPONIN I: 0.27 ng/mL — AB (ref <0.03)

## 2016-02-28 LAB — APTT: APTT: 31 s (ref 24–36)

## 2016-02-28 LAB — ECHOCARDIOGRAM COMPLETE
Height: 66 in
Weight: 4448 oz

## 2016-02-28 LAB — I-STAT TROPONIN, ED: TROPONIN I, POC: 0.22 ng/mL — AB (ref 0.00–0.08)

## 2016-02-28 LAB — BRAIN NATRIURETIC PEPTIDE: B Natriuretic Peptide: 86.9 pg/mL (ref 0.0–100.0)

## 2016-02-28 LAB — PROTIME-INR
INR: 1.09
Prothrombin Time: 14.1 seconds (ref 11.4–15.2)

## 2016-02-28 LAB — D-DIMER, QUANTITATIVE: D-Dimer, Quant: 13.2 ug/mL-FEU — ABNORMAL HIGH (ref 0.00–0.50)

## 2016-02-28 MED ORDER — ASPIRIN 81 MG PO CHEW
324.0000 mg | CHEWABLE_TABLET | Freq: Once | ORAL | Status: AC
  Administered 2016-02-28: 324 mg via ORAL
  Filled 2016-02-28: qty 4

## 2016-02-28 MED ORDER — NITROGLYCERIN 0.4 MG SL SUBL: 0.4000 mg | SUBLINGUAL_TABLET | SUBLINGUAL | Status: DC | PRN

## 2016-02-28 MED ORDER — PERFLUTREN LIPID MICROSPHERE
1.0000 mL | INTRAVENOUS | Status: AC | PRN
  Administered 2016-02-28: 4 mL via INTRAVENOUS
  Filled 2016-02-28: qty 10

## 2016-02-28 MED ORDER — TECHNETIUM TC 99M DIETHYLENETRIAME-PENTAACETIC ACID: 30.0000 | Freq: Once | INTRAVENOUS | Status: DC | PRN

## 2016-02-28 MED ORDER — MOMETASONE FURO-FORMOTEROL FUM 200-5 MCG/ACT IN AERO
2.0000 | INHALATION_SPRAY | Freq: Two times a day (BID) | RESPIRATORY_TRACT | Status: DC
  Administered 2016-02-29 – 2016-03-02 (×5): 2 via RESPIRATORY_TRACT
  Filled 2016-02-28: qty 8.8

## 2016-02-28 MED ORDER — ASPIRIN EC 81 MG PO TBEC
81.0000 mg | DELAYED_RELEASE_TABLET | Freq: Every day | ORAL | Status: DC
  Administered 2016-02-29 – 2016-03-02 (×3): 81 mg via ORAL
  Filled 2016-02-28 (×3): qty 1

## 2016-02-28 MED ORDER — ALBUTEROL SULFATE (2.5 MG/3ML) 0.083% IN NEBU: 2.5000 mg | INHALATION_SOLUTION | RESPIRATORY_TRACT | Status: DC | PRN

## 2016-02-28 MED ORDER — HEPARIN BOLUS VIA INFUSION
5000.0000 [IU] | Freq: Once | INTRAVENOUS | Status: AC
  Administered 2016-02-28: 5000 [IU] via INTRAVENOUS
  Filled 2016-02-28: qty 5000

## 2016-02-28 MED ORDER — MAGNESIUM OXIDE 400 (241.3 MG) MG PO TABS
400.0000 mg | ORAL_TABLET | Freq: Every day | ORAL | Status: DC
  Administered 2016-02-29 – 2016-03-02 (×3): 400 mg via ORAL
  Filled 2016-02-28 (×4): qty 1

## 2016-02-28 MED ORDER — ONDANSETRON HCL 4 MG/2ML IJ SOLN: 4.0000 mg | Freq: Three times a day (TID) | INTRAMUSCULAR | Status: AC | PRN

## 2016-02-28 MED ORDER — LEVOTHYROXINE SODIUM 88 MCG PO TABS
88.0000 ug | ORAL_TABLET | Freq: Every day | ORAL | Status: DC
  Administered 2016-02-29 – 2016-03-02 (×3): 88 ug via ORAL
  Filled 2016-02-28 (×5): qty 1

## 2016-02-28 MED ORDER — TECHNETIUM TO 99M ALBUMIN AGGREGATED
4.0000 | Freq: Once | INTRAVENOUS | Status: AC | PRN
  Administered 2016-02-28: 4 via INTRAVENOUS

## 2016-02-28 MED ORDER — PANTOPRAZOLE SODIUM 40 MG PO TBEC
80.0000 mg | DELAYED_RELEASE_TABLET | Freq: Every day | ORAL | Status: DC
  Administered 2016-02-29 – 2016-03-02 (×3): 80 mg via ORAL
  Filled 2016-02-28 (×3): qty 2

## 2016-02-28 MED ORDER — HEPARIN (PORCINE) IN NACL 100-0.45 UNIT/ML-% IJ SOLN
1550.0000 [IU]/h | INTRAMUSCULAR | Status: DC
Start: 2016-02-28 — End: 2016-03-02
  Administered 2016-02-28 – 2016-03-02 (×4): 1550 [IU]/h via INTRAVENOUS
  Filled 2016-02-28 (×5): qty 250

## 2016-02-28 NOTE — H&P (Signed)
History and Physical    Shannon Hammond ZOX:[id number] DOB: [birth year] DOA: 02/28/2016   PCP: Horatio Pel, MD Chief Complaint:  Chief Complaint  Patient presents with  . Shortness of Breath    HPI: Shannon Hammond is a 70 y.o. female with medical history significant of PE in 2012, DVT in R leg at that time.  No longer on any blood thinner other than 81 ASA.  Has had DOE over past couple of months.  Dr. Einar Gip saw patient in office a month ago, nuclear stress test and echo done at that time which are "normal" according to patient.  Today symptoms became acutely worse.  Becomes very SOB and has retrosternal chest pain when ever she does anything at all.  No prior cardiac problems.  ED Course: D dimer of 13, creat 1.7 (does appear to have baseline CKD stage 3).  EKG shows a very classic S1Q3T3 which was not present in Aug 2016.  Also of note 2d echo in Feb 2016 does not show any elevated pulmonary artery pressures.  Do not have access to work up from Dr. Irven Shelling office last month at this time.  Review of Systems: As per HPI otherwise 10 point review of systems negative.    Past Medical History:  Diagnosis Date  . Anginal pain ([place])   . Arthritis    BACK AND KNEES  . DVT (deep venous thrombosis) ([place]) 2012   RIGHT LEG  . GERD (gastroesophageal reflux disease)   . Heart murmur   . Hemorrhoids   . History of kidney stones   . History of nocturia   . Hypothyroidism   . PE (pulmonary embolism)    HX PE 2012 - TX'D IN [facility] [place]- HOSPITALIZED X 1 WEEK.   NO LONGER ON BLOOD THINNER OTHER THAN 81 MG ASPIRIN  . Seizures ([place]) 2007   ONLY ONCE - THOUGHT TO BE STRESS INDUCED - PT WAS DEALING WITH HER MOTHER'S DEATH  . Shortness of breath    PT TOLD BY HER MEDICAL DOCTOR - SOME LUNG CHANGES DUE TO 2ND HAND SMOKE - SHE WAS GIVEN INHALER TO USE AS NEEDED.    Past Surgical History:  Procedure Laterality Date  . BREAST SURGERY  1993   CYST REMOVED LEFT BREAST  . CHOLECYSTECTOMY   LATE 1990'S  . CYSTOSCOPY WITH RETROGRADE PYELOGRAM, URETEROSCOPY AND STENT PLACEMENT Right 11/27/2012   Procedure: CYSTOSCOPY WITH RETROGRADE PYELOGRAM, URETEROSCOPY, stone basketry AND STENT PLACEMENT;  Surgeon: Franchot Gallo, MD;  Location: [facility];  Service: Urology;  Laterality: Right;  . HOLMIUM LASER APPLICATION Right 10/27/9145   Procedure: HOLMIUM LASER APPLICATION;  Surgeon: Franchot Gallo, MD;  Location: [facility];  Service: Urology;  Laterality: Right;  . LITHOTRIPSY     BOTH KIDNEYS     reports that she has never smoked. She has never used smokeless tobacco. She reports that she does not drink alcohol or use drugs.  No Known Allergies  Family History  Problem Relation Age of Onset  . Heart attack Father   . Clotting disorder Neg Hx       Prior to Admission medications   Medication Sig Start Date End Date Taking? Authorizing Provider  aspirin EC 81 MG tablet Take 81 mg by mouth daily.   Yes Historical Provider, MD  B Complex-C (B-COMPLEX WITH VITAMIN C) tablet Take 1 tablet by mouth daily.   Yes Historical Provider, MD  budesonide-formoterol (SYMBICORT) 160-4.5 MCG/ACT inhaler Inhale 2 puffs into the lungs 2 (two) times  daily.   Yes Historical Provider, MD  Cholecalciferol (VITAMIN D3 PO) Take 1 tablet by mouth daily.   Yes Historical Provider, MD  diclofenac sodium (VOLTAREN) 1 % GEL Apply 2 g topically 3 (three) times daily.   Yes Historical Provider, MD  ibuprofen (ADVIL,MOTRIN) 200 MG tablet Take 400 mg by mouth every 6 (six) hours as needed for moderate pain.   Yes Historical Provider, MD  levothyroxine (SYNTHROID, LEVOTHROID) 88 MCG tablet Take 88 mcg by mouth daily before breakfast.   Yes Historical Provider, MD  magnesium oxide (MAG-OX) 400 MG tablet Take 400 mg by mouth daily.   Yes Historical Provider, MD  meloxicam (MOBIC) 15 MG tablet Take 15 mg by mouth daily.   Yes Historical Provider, MD  metoprolol (LOPRESSOR) 50 MG tablet Take 12.5 mg by mouth 2 (two)  times daily.    Yes Historical Provider, MD  omeprazole (PRILOSEC) 40 MG capsule Take 40 mg by mouth daily.   Yes Historical Provider, MD  spironolactone (ALDACTONE) 50 MG tablet Take 50 mg by mouth 2 (two) times daily.   Yes Historical Provider, MD  triamterene-hydrochlorothiazide (MAXZIDE-25) 37.5-25 MG per tablet Take 1 tablet by mouth every morning.   Yes Historical Provider, MD  VENTOLIN HFA 108 (90 BASE) MCG/ACT inhaler Inhale 2 puffs into the lungs every 4 (four) hours as needed for shortness of breath.  09/21/14  Yes Historical Provider, MD    Physical Exam: Vitals:   02/28/16 1845 02/28/16 1915 02/28/16 2000 02/28/16 2015  BP: 136/94 142/73 128/72 122/65  Pulse: 100 100 93 90  Resp: 22 17 17 21   Temp:      TempSrc:      SpO2: 95% 97% 97% 98%  Weight:      Height:          Constitutional: NAD, calm, comfortable Eyes: PERRL, lids and conjunctivae normal ENMT: Mucous membranes are moist. Posterior pharynx clear of any exudate or lesions.Normal dentition.  Neck: normal, supple, no masses, no thyromegaly Respiratory: clear to auscultation bilaterally, no wheezing, no crackles. Normal respiratory effort. No accessory muscle use.  Cardiovascular: Regular rate and rhythm, no murmurs / rubs / gallops. No extremity edema. 2+ pedal pulses. No carotid bruits.  Abdomen: no tenderness, no masses palpated. No hepatosplenomegaly. Bowel sounds positive.  Musculoskeletal: no clubbing / cyanosis. No joint deformity upper and lower extremities. Good ROM, no contractures. Normal muscle tone.  Skin: no rashes, lesions, ulcers. No induration Neurologic: CN 2-12 grossly intact. Sensation intact, DTR normal. Strength 5/5 in all 4.  Psychiatric: Normal judgment and insight. Alert and oriented x 3. Normal mood.    Labs on Admission: I have personally reviewed following labs and imaging studies  CBC:  Recent Labs Lab 02/28/16 1632  WBC 11.8*  HGB 13.5  HCT 40.6  MCV 89.8  PLT 160   Basic  Metabolic Panel:  Recent Labs Lab 02/28/16 1632  NA 138  K 4.6  CL 108  CO2 25  GLUCOSE 103*  BUN 27*  CREATININE 1.69*  CALCIUM 10.0   GFR: Estimated Creatinine Clearance: 40.9 mL/min (by C-G formula based on SCr of 1.69 mg/dL (H)). Liver Function Tests: No results for input(s): AST, ALT, ALKPHOS, BILITOT, PROT, ALBUMIN in the last 168 hours. No results for input(s): LIPASE, AMYLASE in the last 168 hours. No results for input(s): AMMONIA in the last 168 hours. Coagulation Profile:  Recent Labs Lab 02/28/16 1910  INR 1.09   Cardiac Enzymes:  Recent Labs Lab 02/28/16 1902  TROPONINI 0.27*   BNP (last 3 results) No results for input(s): PROBNP in the last 8760 hours. HbA1C: No results for input(s): HGBA1C in the last 72 hours. CBG: No results for input(s): GLUCAP in the last 168 hours. Lipid Profile: No results for input(s): CHOL, HDL, LDLCALC, TRIG, CHOLHDL, LDLDIRECT in the last 72 hours. Thyroid Function Tests: No results for input(s): TSH, T4TOTAL, FREET4, T3FREE, THYROIDAB in the last 72 hours. Anemia Panel: No results for input(s): VITAMINB12, FOLATE, FERRITIN, TIBC, IRON, RETICCTPCT in the last 72 hours. Urine analysis: No results found for: COLORURINE, APPEARANCEUR, LABSPEC, PHURINE, GLUCOSEU, HGBUR, BILIRUBINUR, KETONESUR, PROTEINUR, UROBILINOGEN, NITRITE, LEUKOCYTESUR Sepsis Labs: @LABRCNTIP (procalcitonin:4,lacticidven:4) )No results found for this or any previous visit (from the past 240 hour(s)).   Radiological Exams on Admission: Dg Chest 2 View  Result Date: 02/28/2016 CLINICAL DATA:  Left-sided chest pain radiating into neck and jaw. Associated shortness of breath. EXAM: CHEST  2 VIEW COMPARISON:  11/15/2005 FINDINGS: The heart size and mediastinal contours are within normal limits. Stable scarring/ atelectasis at the right lung base. There is no evidence of pulmonary edema, consolidation, pneumothorax, nodule or pleural fluid. The visualized  skeletal structures are unremarkable. IMPRESSION: No active cardiopulmonary disease. Electronically Signed   By: Aletta Edouard M.D.   On: 02/28/2016 17:12    EKG: Independently reviewed.  Assessment/Plan Principal Problem:   Recurrent pulmonary embolism (HCC) Active Problems:   Shortness of breath    1. High suspicion of recurrent PE, likely submassive with R heart strain as demonstrated on EKG - 1. 2d echo 2. Dr. Doylene Canard has seen patient in ED 3. VQ scan 4. Very high suspicion of PE, heparin gtt already started 5. Need to follow up results, may very well be a candidate for catheter directed TPA tomorrow due to strain pattern 6. If PE confirmed, likely life long anticoagulation   DVT prophylaxis: heparin gtt Code Status: Full Family Communication: Family at bedside Consults called: Tried [place] Dr. Doylene Canard but he actually came and saw patient here in the ED and wants 2d echo. Admission status: Admit to inpatient   Etta Quill DO [facility] Pager [phone] from 7PM-7AM  If 7AM-7PM, please contact the day physician for the patient [email or web address] Password [email or web address]  02/28/2016, 9:03 PM

## 2016-02-28 NOTE — Progress Notes (Signed)
Echocardiogram 2D Echocardiogram has been performed with definity.  Aggie Cosier 02/28/2016, 10:22 PM

## 2016-02-28 NOTE — ED Notes (Addendum)
Troponin 0.22,Dr. Gilford Raid notified

## 2016-02-28 NOTE — Progress Notes (Signed)
PCCM going to evaluate patient, but it sounds like she wont be needing catheter directed TPA at the moment.

## 2016-02-28 NOTE — ED Provider Notes (Signed)
[facility] DEPT Provider Note   CSN: [id number] Arrival date & time: 02/28/16  1609     History   Chief Complaint Chief Complaint  Patient presents with  . Shortness of Breath    HPI Shannon Hammond is a 70 y.o. female.  HPI Shannon Hammond is a 70 y.o. female who presents to ED with complaint of exertional chest pain and SOB. Pt states She has had some exertional shortness of breath over last several months. She was seen by Dr. Einar Gip a month ago and states had nuclear stress test and Echo done which was according to her "normal." Pt states that she noticed today her symptoms have worsened. She states that whenever she tries to do anything a walker she develops retrosternal chest pain and shortness of breath with pain radiating to her bilateral jaw. She states she has to sit down and rest. She reports no prior cardiac problems. Reports history of DVT multiple years ago and was placed on Coumadin at that time. She is no longer on any anticoagulants. She states her symptoms today feel like prior pulmonary embolism. States "I think I've had it for several months and no one can figure it out." Patient also admits to swelling in bilateral legs, states it is chronic and her doctor has been changing the dosage of her diuretic, states that they did swell up more in the last 2 weeks. Denies recent travel or surgeries, last traveled in August to [place].  Past Medical History:  Diagnosis Date  . Anginal pain ([place])   . Arthritis    BACK AND KNEES  . DVT (deep venous thrombosis) ([place]) 2012   RIGHT LEG  . GERD (gastroesophageal reflux disease)   . Heart murmur   . Hemorrhoids   . History of kidney stones   . History of nocturia   . Hypothyroidism   . PE (pulmonary embolism)    HX PE 2012 - TX'D IN [place]- HOSPITALIZED X 1 WEEK.   NO LONGER ON BLOOD THINNER OTHER THAN 81 MG ASPIRIN  . Seizures ([place]) 2007   ONLY ONCE - THOUGHT TO BE STRESS INDUCED - PT WAS DEALING WITH HER MOTHER'S  DEATH  . Shortness of breath    PT TOLD BY HER MEDICAL DOCTOR - SOME LUNG CHANGES DUE TO 2ND HAND SMOKE - SHE WAS GIVEN INHALER TO USE AS NEEDED.    Patient Active Problem List   Diagnosis Date Noted  . Chest pain 11/03/2014  . Dyspnea 11/02/2014    Past Surgical History:  Procedure Laterality Date  . BREAST SURGERY  1993   CYST REMOVED LEFT BREAST  . CHOLECYSTECTOMY  LATE 1990'S  . CYSTOSCOPY WITH RETROGRADE PYELOGRAM, URETEROSCOPY AND STENT PLACEMENT Right 11/27/2012   Procedure: CYSTOSCOPY WITH RETROGRADE PYELOGRAM, URETEROSCOPY, stone basketry AND STENT PLACEMENT;  Surgeon: Franchot Gallo, MD;  Location: [facility];  Service: Urology;  Laterality: Right;  . HOLMIUM LASER APPLICATION Right 0/86/7619   Procedure: HOLMIUM LASER APPLICATION;  Surgeon: Franchot Gallo, MD;  Location: [facility];  Service: Urology;  Laterality: Right;  . LITHOTRIPSY     BOTH KIDNEYS    OB History    No data available       Home Medications    Prior to Admission medications   Medication Sig Start Date End Date Taking? Authorizing Provider  aspirin EC 81 MG tablet Take 81 mg by mouth daily.    Historical Provider, MD  ciprofloxacin (CIPRO) 500 MG tablet Take 1 tablet (500 mg  total) by mouth 2 (two) times daily. 11/27/12   Franchot Gallo, MD  doxycycline (VIBRAMYCIN) 100 MG capsule Take 1 capsule (100 mg total) by mouth 2 (two) times daily. 02/02/16   Mcarthur Rossetti, MD  glucosamine-chondroitin 500-400 MG tablet Take 1 tablet by mouth 2 (two) times daily.     Historical Provider, MD  levalbuterol [facility] HFA) 45 MCG/ACT inhaler Inhale 1-2 puffs into the lungs every 4 (four) hours as needed for wheezing or shortness of breath.    Historical Provider, MD  levothyroxine (SYNTHROID, LEVOTHROID) 88 MCG tablet Take 88 mcg by mouth daily before breakfast.    Historical Provider, MD  meloxicam (MOBIC) 7.5 MG tablet Take 1 tablet (7.5 mg total) by mouth 2 (two) times daily between meals as needed for  pain. 02/02/16   Mcarthur Rossetti, MD  Meth-Hyo-M Bl-Na Phos-Ph Sal (URIBEL) 118 MG CAPS Take 1 capsule (118 mg total) by mouth every 8 (eight) hours as needed. 11/27/12   Franchot Gallo, MD  metoprolol (LOPRESSOR) 50 MG tablet Take 25 mg by mouth 2 (two) times daily.    Historical Provider, MD  mometasone (NASONEX) 50 MCG/ACT nasal spray Place 2 sprays into the nose daily.    Historical Provider, MD  mupirocin ointment (BACTROBAN) 2 % Apply 1 application topically daily. 02/02/16   Mcarthur Rossetti, MD  omeprazole (PRILOSEC) 40 MG capsule Take 40 mg by mouth daily.    Historical Provider, MD  predniSONE (DELTASONE) 5 MG tablet 5 mg daily. 10/25/14   Historical Provider, MD  SYMBICORT 160-4.5 MCG/ACT inhaler 2 puffs 2 (two) times daily. 08/11/14   Historical Provider, MD  triamterene-hydrochlorothiazide (MAXZIDE-25) 37.5-25 MG per tablet Take 1 tablet by mouth every morning.    Historical Provider, MD  VENTOLIN HFA 108 (90 BASE) MCG/ACT inhaler 2 puffs every 4 (four) hours as needed. 09/21/14   Historical Provider, MD    Family History No family history on file.  Social History Social History  Substance Use Topics  . Smoking status: Never Smoker  . Smokeless tobacco: Never Used  . Alcohol use No     Allergies   Patient has no known allergies.   Review of Systems Review of Systems  Constitutional: Negative for chills and fever.  Respiratory: Positive for chest tightness and shortness of breath. Negative for cough.   Cardiovascular: Positive for chest pain and leg swelling. Negative for palpitations.  Gastrointestinal: Negative for abdominal pain, diarrhea, nausea and vomiting.  Genitourinary: Negative for dysuria, flank pain, pelvic pain, vaginal bleeding, vaginal discharge and vaginal pain.  Musculoskeletal: Negative for arthralgias, myalgias, neck pain and neck stiffness.  Skin: Negative for rash.  Neurological: Negative for dizziness, weakness and headaches.  All  other systems reviewed and are negative.    Physical Exam Updated Vital Signs BP 142/73   Pulse 100   Temp 97.8 F (36.6 C) (Oral)   Resp 17   Ht 5\' 6"  (1.676 m)   Wt 126.1 kg   SpO2 97%   BMI 44.87 kg/m   Physical Exam  Constitutional: She appears well-developed and well-nourished. No distress.  HENT:  Head: Normocephalic.  Eyes: Conjunctivae are normal.  Neck: Neck supple.  Cardiovascular: Normal rate, regular rhythm and normal heart sounds.   Pulmonary/Chest: Effort normal and breath sounds normal. She has no wheezes. She has no rales.  Tachypnea  Abdominal: Soft. Bowel sounds are normal. She exhibits no distension. There is no tenderness. There is no rebound.  Musculoskeletal: She exhibits edema.  Bilateral  lower extremity edema, 2+, pitting. No calf tenderness.  Neurological: She is alert.  Skin: Skin is warm and dry.  Psychiatric: She has a normal mood and affect. Her behavior is normal.  Nursing note and vitals reviewed.    ED Treatments / Results  Labs (all labs ordered are listed, but only abnormal results are displayed) Labs Reviewed  BASIC METABOLIC PANEL - Abnormal; Notable for the following:       Result Value   Glucose, Bld 103 (*)    BUN 27 (*)    Creatinine, Ser 1.69 (*)    GFR calc non Af Amer 29 (*)    GFR calc Af Amer 34 (*)    All other components within normal limits  CBC - Abnormal; Notable for the following:    WBC 11.8 (*)    All other components within normal limits  TROPONIN I - Abnormal; Notable for the following:    Troponin I 0.27 (*)    All other components within normal limits  I-STAT TROPOININ, ED - Abnormal; Notable for the following:    Troponin i, poc 0.22 (*)    All other components within normal limits  BRAIN NATRIURETIC PEPTIDE  D-DIMER, QUANTITATIVE (NOT AT [facility])  PROTIME-INR  APTT  HEPARIN LEVEL (UNFRACTIONATED)  CBC    EKG  EKG Interpretation  Date/Time:  Tuesday February 28 2016 16:16:50 EST Ventricular  Rate:  105 PR Interval:  148 QRS Duration: 92 QT Interval:  326 QTC Calculation: 430 R Axis:   -40 Text Interpretation:  Sinus tachycardia Left axis deviation Inferior infarct , age undetermined Anterior infarct , age undetermined Abnormal ECG t wave inversions inferior leads  are old Confirmed by [facility] MD, JULIE [phone]) on 02/28/2016 6:41:32 PM       Radiology Dg Chest 2 View  Result Date: 02/28/2016 CLINICAL DATA:  Left-sided chest pain radiating into neck and jaw. Associated shortness of breath. EXAM: CHEST  2 VIEW COMPARISON:  11/15/2005 FINDINGS: The heart size and mediastinal contours are within normal limits. Stable scarring/ atelectasis at the right lung base. There is no evidence of pulmonary edema, consolidation, pneumothorax, nodule or pleural fluid. The visualized skeletal structures are unremarkable. IMPRESSION: No active cardiopulmonary disease. Electronically Signed   By: Aletta Edouard M.D.   On: 02/28/2016 17:12    Procedures Procedures (including critical care time)  Medications Ordered in ED Medications  nitroGLYCERIN (NITROSTAT) SL tablet 0.4 mg (not administered)  heparin ADULT infusion 100 units/mL (25000 units/253mL sodium chloride 0.45%) (not administered)  heparin bolus via infusion 5,000 Units (not administered)  aspirin chewable tablet 324 mg (324 mg Oral Given 02/28/16 1926)     Initial Impression / Assessment and Plan / ED Course  I have reviewed the triage vital signs and the nursing notes.  Pertinent labs & imaging results that were available during my care of the patient were reviewed by me and considered in my medical decision making (see chart for details).  Clinical Course     Patient in the emergency department with chest pain or shortness of breath. Shortness of breath for several months, worsened today, chest pain worsened today as well. Patient appears tachypnea, normal vital signs otherwise. Will check labs including EKG, troponin,  chest x-ray.  Patient's troponin is elevated at 0.22. Aspirin, nitroglycerin ordered. Patient is chest pain-free at this time. Given suspicion for ACS versus PE, will start on heparin. Unable to perform CT angiogram due to renal insufficiency. VQ scan and d-dimer ordered.   8:08  PM Patient is d-dimer is 13.2. Will admit for most likely a PE. Patient continues to have no pain, continues to be tachypnec, vital signs otherwise normal. PT on heparin. Spoke with triad, will admit.  VQ pending.   Final Clinical Impressions(s) / ED Diagnoses   Final diagnoses:  Chest pain  Shortness of breath  Elevated d-dimer  Renal insufficiency    New Prescriptions New Prescriptions   No medications on file     Jeannett Senior, PA-C 02/29/16 0149    Isla Pence, MD 03/02/16 [phone]

## 2016-02-28 NOTE — ED Notes (Signed)
Brought patient back to room and put in gown and on monitor.  Patient states pain in center of chest with movement and SOB.  RN notified.

## 2016-02-28 NOTE — Progress Notes (Addendum)
[place] for heparin Indication: ACS, possible pulmonary embolus  Heparin Dosing Weight: 89.7 kg    Assessment: 58 yof with CP/SOB on admit. Hx of PE/DVT in 2012 - no longer on anticoagulation. Pharmacy consulted to dose heparin for ACS, possible PE. CBC wnl, no bleed documented. V/Q scan ordered.  Goal of Therapy:  Heparin level 0.3-0.7 units/ml Monitor platelets by anticoagulation protocol: Yes   Plan:  Heparin 5000 unit bolus Start heparin at 1550 units/h 6h heparin level Daily heparin level/CBC Monitor s/sx bleeding  Elicia Lamp, PharmD, BCPS Clinical Pharmacist Pager [phone] 02/28/2016 7:34 PM   ADDENDUM: Heparin indication now for possible PE, no longer for ACS. Heparin bolus/infusion rate previously ordered is appropriate for PE treatment.   Elicia Lamp, PharmD, BCPS Clinical Pharmacist 02/28/2016 8:39 PM

## 2016-02-28 NOTE — ED Triage Notes (Signed)
Pt reports sob over the last 2 days that seemed worse this am worse with walking. Pt reports a history of PE in 2012, copd and pneumonia last year.

## 2016-02-28 NOTE — Consult Note (Signed)
Name: Shannon Hammond MRN: [id number] DOB: 01-15-1946    ADMISSION DATE:  02/28/2016 CONSULTATION DATE:  02/28/2016  REFERRING MD :  Dr. Alcario Drought [facility]  CHIEF COMPLAINT:  SOB  BRIEF PATIENT DESCRIPTION: 70F with history of PE in 2012 now off anticoagulation presented SOB 12/12. Elevated Ddimer and unable to undergo CT due to renal failure. Concern PE. Admitted by [facility], PCCM consult.  SIGNIFICANT EVENTS    STUDIES:  VQ 12/12 > High prob PE   HISTORY OF PRESENT ILLNESS:  70F with PMH as below, which is significant for PE (2012 completed 6 months warfarin), GERD, Hypothyroid, and seizures. She presented to [facility] ED 12/12 with complaints of SOB over the past 3-4 days, but significantly worse on the day of presentation. She also noticed bilateral lower extremity edema and cramping, but R>L. In the ED there was high concern for PE based on history and elevated Ddimer and RV strain pattern on ECG. She was unable to have CTA due to renal function. V/Q was high probability for PE. Started on heparin. Comfortable on 2L [place]. PCCM to see.   PAST MEDICAL HISTORY :   has a past medical history of Anginal pain ([place]); Arthritis; DVT (deep venous thrombosis) ([place]) (2012); GERD (gastroesophageal reflux disease); Heart murmur; Hemorrhoids; History of kidney stones; History of nocturia; Hypothyroidism; PE (pulmonary embolism); Seizures ([place]) (2007); and Shortness of breath.  has a past surgical history that includes Breast surgery (1993); Cholecystectomy (LATE 1990'S); Lithotripsy; Cystoscopy with retrograde pyelogram, ureteroscopy and stent placement (Right, 11/27/2012); and Holmium laser application (Right, 0/86/7619). Prior to Admission medications   Medication Sig Start Date End Date Taking? Authorizing Provider  aspirin EC 81 MG tablet Take 81 mg by mouth daily.   Yes Historical Provider, MD  B Complex-C (B-COMPLEX WITH VITAMIN C) tablet Take 1 tablet by mouth daily.   Yes Historical Provider, MD    budesonide-formoterol (SYMBICORT) 160-4.5 MCG/ACT inhaler Inhale 2 puffs into the lungs 2 (two) times daily.   Yes Historical Provider, MD  Cholecalciferol (VITAMIN D3 PO) Take 1 tablet by mouth daily.   Yes Historical Provider, MD  diclofenac sodium (VOLTAREN) 1 % GEL Apply 2 g topically 3 (three) times daily.   Yes Historical Provider, MD  ibuprofen (ADVIL,MOTRIN) 200 MG tablet Take 400 mg by mouth every 6 (six) hours as needed for moderate pain.   Yes Historical Provider, MD  levothyroxine (SYNTHROID, LEVOTHROID) 88 MCG tablet Take 88 mcg by mouth daily before breakfast.   Yes Historical Provider, MD  magnesium oxide (MAG-OX) 400 MG tablet Take 400 mg by mouth daily.   Yes Historical Provider, MD  meloxicam (MOBIC) 15 MG tablet Take 15 mg by mouth daily.   Yes Historical Provider, MD  metoprolol (LOPRESSOR) 50 MG tablet Take 12.5 mg by mouth 2 (two) times daily.    Yes Historical Provider, MD  omeprazole (PRILOSEC) 40 MG capsule Take 40 mg by mouth daily.   Yes Historical Provider, MD  spironolactone (ALDACTONE) 50 MG tablet Take 50 mg by mouth 2 (two) times daily.   Yes Historical Provider, MD  triamterene-hydrochlorothiazide (MAXZIDE-25) 37.5-25 MG per tablet Take 1 tablet by mouth every morning.   Yes Historical Provider, MD  VENTOLIN HFA 108 (90 BASE) MCG/ACT inhaler Inhale 2 puffs into the lungs every 4 (four) hours as needed for shortness of breath.  09/21/14  Yes Historical Provider, MD   No Known Allergies  FAMILY HISTORY:  family history includes Heart attack in her father. SOCIAL HISTORY:  reports that she has never smoked. She has never used smokeless tobacco. She reports that she does not drink alcohol or use drugs.  REVIEW OF SYSTEMS:    Bolds are positive Constitutional: weight loss, gain, night sweats, Fevers, chills, fatigue .  HEENT: headaches, Sore throat, sneezing, nasal congestion, post nasal drip, Difficulty swallowing, Tooth/dental problems, visual complaints visual  changes, ear ache CV:  chest pain, radiates: ,Orthopnea, PND, swelling in lower extremities, dizziness, palpitations, syncope.  GI  heartburn, indigestion, abdominal pain, nausea, vomiting, diarrhea, change in bowel habits, loss of appetite, bloody stools.  Resp: cough, productive: , hemoptysis, dyspnea on exertion only, chest pain, pleuritic.  Skin: rash or itching or icterus GU: dysuria, change in color of urine, urgency or frequency. flank pain, hematuria  MS: joint pain, lower extremity swelling. decreased range of motion  Psych: change in mood or affect. depression or anxiety.  Neuro: difficulty with speech, weakness, numbness, ataxia    SUBJECTIVE:   VITAL SIGNS: Temp:  [97.8 F (36.6 C)] 97.8 F (36.6 C) (12/12 1621) Pulse Rate:  [88-108] 88 (12/12 2330) Resp:  [14-24] 24 (12/12 2303) BP: (117-142)/(40-107) 117/63 (12/12 2330) SpO2:  [95 %-99 %] 96 % (12/12 2330) Weight:  [126.1 kg (278 lb)] 126.1 kg (278 lb) (12/12 1622)  PHYSICAL EXAMINATION: General:  Obese female in NAD Neuro:  Alert, oriented, non-focal HEENT:  [place]/AT, PERRL, no JVD Cardiovascular:  RRR, no MRG.  Lungs:  Clear Abdomen:  Soft, non-tender, non-distended Musculoskeletal:  No acute deformity or ROM limitation. BLE edema Skin:  Grossly intact   Recent Labs Lab 02/28/16 1632  NA 138  K 4.6  CL 108  CO2 25  BUN 27*  CREATININE 1.69*  GLUCOSE 103*    Recent Labs Lab 02/28/16 1632  HGB 13.5  HCT 40.6  WBC 11.8*  PLT 214   Dg Chest 2 View  Result Date: 02/28/2016 CLINICAL DATA:  Left-sided chest pain radiating into neck and jaw. Associated shortness of breath. EXAM: CHEST  2 VIEW COMPARISON:  11/15/2005 FINDINGS: The heart size and mediastinal contours are within normal limits. Stable scarring/ atelectasis at the right lung base. There is no evidence of pulmonary edema, consolidation, pneumothorax, nodule or pleural fluid. The visualized skeletal structures are unremarkable. IMPRESSION: No  active cardiopulmonary disease. Electronically Signed   By: Aletta Edouard M.D.   On: 02/28/2016 17:12   Nm Pulmonary Perf And Vent  Result Date: 02/28/2016 CLINICAL DATA:  70 year old female with chest pain. EXAM: NUCLEAR MEDICINE VENTILATION - PERFUSION LUNG SCAN TECHNIQUE: Ventilation images were obtained in multiple projections using inhaled aerosol Tc-47m DTPA. Perfusion images were obtained in multiple projections after intravenous injection of Tc-29m MAA. RADIOPHARMACEUTICALS:  29.0 mCi Technetium-26m DTPA aerosol inhalation and 3.9 mCi Technetium-11m MAA IV COMPARISON:  Chest radiograph dated 02/28/2016 FINDINGS: Ventilation: There is a somewhat wedge-shaped appearing ventilation defect involving the posterior aspect of the right lung likely in the superior segment of the right lower lobe. Perfusion: There is a wedge-shaped perfusion defect in the right lung likely in the superior segment of the right lower lobe which appears larger than the corresponding ventilation defect. A segmental area mismatched perfusion defect is also noted in the left lower lobe. IMPRESSION: High probability for pulmonary embolism. These findings were reviewed in person with Dr. Doylene Canard at time of the interpretation on 02/28/2016 at 9:50 pm . Electronically Signed   By: Anner Crete M.D.   On: 02/28/2016 22:01    ASSESSMENT / PLAN:  Recurrent pulmonary embolism -  High prob on V/Q, RV strain on EKG and Echo. PA peak pressure 45mmHg. She is currently hemodynamically stable and breathing comfortably on 2L [place] with sats in high 90s.  - Telemetry - Supplemental O2 PRN to keep SpO2 > 92% - Continue heparin gtt with plans to transition to warfarin. - At this time she would like to defer and invasive treatments such as EKOS, would be willing to revisit if worsens. - LE dopplers pending  Georgann Housekeeper, AGACNP-BC [facility] Pulmonology/Critical Care Pager [phone] or [phone]  02/29/2016 12:35 AM

## 2016-02-28 NOTE — Consult Note (Signed)
Referring Physician: Nelva Nay.  Shannon Hammond is an 70 y.o. female.                       Chief Complaint: Shortness of breath  HPI: 70 year old female had shortness of breath with any activity today over her baseline shortness of breath for several months. She had normal echocardiogram and nuclear stress test 1 month ago by Dr. Einar Gip. She has h/o PE in 2012 and has been off blood thinner for long time. EKG today showed S1,Q3,T3. Elevated D-dimer, VQ scan has high probability of PE and echocardiogram shows dilated RV with moderate to severe RV dysfunction and moderate pulmonary hypertension. IV heparin already started prior to investigations and CCM consulted.  Past Medical History:  Diagnosis Date  . Anginal pain ([place])   . Arthritis    BACK AND KNEES  . DVT (deep venous thrombosis) ([place]) 2012   RIGHT LEG  . GERD (gastroesophageal reflux disease)   . Heart murmur   . Hemorrhoids   . History of kidney stones   . History of nocturia   . Hypothyroidism   . PE (pulmonary embolism)    HX PE 2012 - TX'D IN [place]- HOSPITALIZED X 1 WEEK.   NO LONGER ON BLOOD THINNER OTHER THAN 81 MG ASPIRIN  . Seizures ([place]) 2007   ONLY ONCE - THOUGHT TO BE STRESS INDUCED - PT WAS DEALING WITH HER MOTHER'S DEATH  . Shortness of breath    PT TOLD BY HER MEDICAL DOCTOR - SOME LUNG CHANGES DUE TO 2ND HAND SMOKE - SHE WAS GIVEN INHALER TO USE AS NEEDED.      Past Surgical History:  Procedure Laterality Date  . BREAST SURGERY  1993   CYST REMOVED LEFT BREAST  . CHOLECYSTECTOMY  LATE 1990'S  . CYSTOSCOPY WITH RETROGRADE PYELOGRAM, URETEROSCOPY AND STENT PLACEMENT Right 11/27/2012   Procedure: CYSTOSCOPY WITH RETROGRADE PYELOGRAM, URETEROSCOPY, stone basketry AND STENT PLACEMENT;  Surgeon: Franchot Gallo, MD;  Location: [facility];  Service: Urology;  Laterality: Right;  . HOLMIUM LASER APPLICATION Right 8/82/8003   Procedure: HOLMIUM LASER APPLICATION;  Surgeon: Franchot Gallo, MD;  Location: [facility]  [facility];  Service: Urology;  Laterality: Right;  . LITHOTRIPSY     BOTH KIDNEYS    Family History  Problem Relation Age of Onset  . Heart attack Father   . Clotting disorder Neg Hx    Social History:  reports that she has never smoked. She has never used smokeless tobacco. She reports that she does not drink alcohol or use drugs.  Allergies: No Known Allergies   (Not in a hospital admission)  Results for orders placed or performed during the hospital encounter of 02/28/16 (from the past 48 hour(s))  Basic metabolic panel     Status: Abnormal   Collection Time: 02/28/16  4:32 PM  Result Value Ref Range   Sodium 138 135 - 145 mmol/L   Potassium 4.6 3.5 - 5.1 mmol/L   Chloride 108 101 - 111 mmol/L   CO2 25 22 - 32 mmol/L   Glucose, Bld 103 (H) 65 - 99 mg/dL   BUN 27 (H) 6 - 20 mg/dL   Creatinine, Ser 1.69 (H) 0.44 - 1.00 mg/dL   Calcium 10.0 8.9 - 10.3 mg/dL   GFR calc non Af Amer 29 (L) >60 mL/min   GFR calc Af Amer 34 (L) >60 mL/min    Comment: (NOTE) The eGFR has been calculated using the  CKD EPI equation. This calculation has not been validated in all clinical situations. eGFR's persistently <60 mL/min signify possible Chronic Kidney Disease.    Anion gap 5 5 - 15  CBC     Status: Abnormal   Collection Time: 02/28/16  4:32 PM  Result Value Ref Range   WBC 11.8 (H) 4.0 - 10.5 K/uL   RBC 4.52 3.87 - 5.11 MIL/uL   Hemoglobin 13.5 12.0 - 15.0 g/dL   HCT 40.6 36.0 - 46.0 %   MCV 89.8 78.0 - 100.0 fL   MCH 29.9 26.0 - 34.0 pg   MCHC 33.3 30.0 - 36.0 g/dL   RDW 15.0 11.5 - 15.5 %   Platelets 214 150 - 400 K/uL  Brain natriuretic peptide     Status: None   Collection Time: 02/28/16  4:32 PM  Result Value Ref Range   B Natriuretic Peptide 86.9 0.0 - 100.0 pg/mL  I-stat troponin, ED     Status: Abnormal   Collection Time: 02/28/16  4:46 PM  Result Value Ref Range   Troponin i, poc 0.22 (HH) 0.00 - 0.08 ng/mL   Comment NOTIFIED PHYSICIAN    Comment 3            Comment:  Due to the release kinetics of cTnI, a negative result within the first hours of the onset of symptoms does not rule out myocardial infarction with certainty. If myocardial infarction is still suspected, repeat the test at appropriate intervals.   D-dimer, quantitative (not at [facility])     Status: Abnormal   Collection Time: 02/28/16  7:02 PM  Result Value Ref Range   D-Dimer, Quant 13.20 (H) 0.00 - 0.50 ug/mL-FEU    Comment: (NOTE) At the manufacturer cut-off of 0.50 ug/mL FEU, this assay has been documented to exclude PE with a sensitivity and negative predictive value of 97 to 99%.  At this time, this assay has not been approved by the FDA to exclude DVT/VTE. Results should be correlated with clinical presentation.   Troponin I     Status: Abnormal   Collection Time: 02/28/16  7:02 PM  Result Value Ref Range   Troponin I 0.27 (HH) <0.03 ng/mL    Comment: CRITICAL RESULT CALLED TO, READ BACK BY AND VERIFIED WITH: C DARTT,RN 1939 02/28/16 D BRADLEY   Protime-INR     Status: None   Collection Time: 02/28/16  7:10 PM  Result Value Ref Range   Prothrombin Time 14.1 11.4 - 15.2 seconds   INR 1.09   APTT     Status: None   Collection Time: 02/28/16  7:10 PM  Result Value Ref Range   aPTT 31 24 - 36 seconds   Dg Chest 2 View  Result Date: 02/28/2016 CLINICAL DATA:  Left-sided chest pain radiating into neck and jaw. Associated shortness of breath. EXAM: CHEST  2 VIEW COMPARISON:  11/15/2005 FINDINGS: The heart size and mediastinal contours are within normal limits. Stable scarring/ atelectasis at the right lung base. There is no evidence of pulmonary edema, consolidation, pneumothorax, nodule or pleural fluid. The visualized skeletal structures are unremarkable. IMPRESSION: No active cardiopulmonary disease. Electronically Signed   By: Aletta Edouard M.D.   On: 02/28/2016 17:12   Nm Pulmonary Perf And Vent  Result Date: 02/28/2016 CLINICAL DATA:  70 year old female with chest pain.  EXAM: NUCLEAR MEDICINE VENTILATION - PERFUSION LUNG SCAN TECHNIQUE: Ventilation images were obtained in multiple projections using inhaled aerosol Tc-28mDTPA. Perfusion images were obtained in multiple projections after  intravenous injection of Tc-80mMAA. RADIOPHARMACEUTICALS:  29.0 mCi Technetium-989mTPA aerosol inhalation and 3.9 mCi Technetium-9919mA IV COMPARISON:  Chest radiograph dated 02/28/2016 FINDINGS: Ventilation: There is a somewhat wedge-shaped appearing ventilation defect involving the posterior aspect of the right lung likely in the superior segment of the right lower lobe. Perfusion: There is a wedge-shaped perfusion defect in the right lung likely in the superior segment of the right lower lobe which appears larger than the corresponding ventilation defect. A segmental area mismatched perfusion defect is also noted in the left lower lobe. IMPRESSION: High probability for pulmonary embolism. These findings were reviewed in person with Dr. KadDoylene Canard time of the interpretation on 02/28/2016 at 9:50 pm . Electronically Signed   By: AraAnner CreteD.   On: 02/28/2016 22:01    Review Of Systems Constitutional: No fever, chills , weight loss or gain. Eyes: Positive vision change, Wears glasses. No discharge or pain.. Ears: No hearing loss, No tinnitus. Respiratory: No asthma, COPD, pneumonias or shortness of breath. No hemoptysis. Cardiovascular: No chest pain, palpitation or leg edema. Gastrointestinal: No nausea, vomiting or diarrhea or constipation. Positive GI bleed. No hepatitis. Genitourinary: No dysuria, hematuria. Positive kidney stone. No incontinance. Neurological: No headache, stroke or seizures.  Psychiatry: No psych facility admission for anxiety, depression or suicide. No detox. Skin: No rash. Musculoskeletal: Positive joint pain or fibromyalgia. No neck pain or back pain. Lymphadenopathy: No lymphadenopathy Hematology: No anemia or easy bruising.   Blood pressure  122/65, pulse 90, temperature 97.8 F (36.6 C), temperature source Oral, resp. rate 21, height '5\' 6"'  (1.676 m), weight 126.1 kg (278 lb), SpO2 98 %. Body mass index is 44.87 kg/m. General appearance: alert, cooperative, appears stated age and mild respiratory distress at rest. Head: Normocephalic, atraumatic. Eyes: Pink conjunctivae/corneas clear. PERRL, EOM's intact. Neck: No adenopathy, no carotid bruit, no JVD, supple, symmetrical, trachea midline and thyroid not enlarged, symmetric, no tenderness/mass/nodules Resp: Clear to auscultation bilaterally. Cardio: Regular rate and rhythm, S1, S2 normal, II/VI systolic murmur, no click, rub or gallop GI: soft, non-tender; bowel sounds normal; no masses,  no organomegaly Extremities: extremities normal, atraumatic, no cyanosis. Trace or edema. Skin: Warm and dry. No rashes or lesions Neurologic: Alert and oriented X 3, normal strength and tone. Normal coordination.  Assessment/Plan Acute PE RV systolic dysfunction Moderate pulmonary hypertension Obesity CKD, III Hypothyroidism  Agree with IV heparin with stable vital signs.  KADBirdie RiddleD  02/28/2016, 11:01 PM

## 2016-02-28 NOTE — ED Notes (Signed)
Pt to nuclear med

## 2016-02-29 ENCOUNTER — Inpatient Hospital Stay (HOSPITAL_COMMUNITY): Payer: Commercial Managed Care - HMO

## 2016-02-29 DIAGNOSIS — I2699 Other pulmonary embolism without acute cor pulmonale: Secondary | ICD-10-CM

## 2016-02-29 DIAGNOSIS — I480 Paroxysmal atrial fibrillation: Secondary | ICD-10-CM

## 2016-02-29 DIAGNOSIS — N183 Chronic kidney disease, stage 3 unspecified: Secondary | ICD-10-CM

## 2016-02-29 LAB — CBC
HCT: 38.6 % (ref 36.0–46.0)
Hemoglobin: 12.8 g/dL (ref 12.0–15.0)
MCH: 29.9 pg (ref 26.0–34.0)
MCHC: 33.2 g/dL (ref 30.0–36.0)
MCV: 90.2 fL (ref 78.0–100.0)
PLATELETS: 201 10*3/uL (ref 150–400)
RBC: 4.28 MIL/uL (ref 3.87–5.11)
RDW: 15 % (ref 11.5–15.5)
WBC: 11.4 10*3/uL — ABNORMAL HIGH (ref 4.0–10.5)

## 2016-02-29 LAB — BASIC METABOLIC PANEL WITH GFR
Anion gap: 8 (ref 5–15)
BUN: 27 mg/dL — ABNORMAL HIGH (ref 6–20)
CO2: 26 mmol/L (ref 22–32)
Calcium: 9.8 mg/dL (ref 8.9–10.3)
Chloride: 105 mmol/L (ref 101–111)
Creatinine, Ser: 1.83 mg/dL — ABNORMAL HIGH (ref 0.44–1.00)
GFR calc Af Amer: 31 mL/min — ABNORMAL LOW
GFR calc non Af Amer: 26 mL/min — ABNORMAL LOW
Glucose, Bld: 98 mg/dL (ref 65–99)
Potassium: 4.8 mmol/L (ref 3.5–5.1)
Sodium: 139 mmol/L (ref 135–145)

## 2016-02-29 LAB — HEPARIN LEVEL (UNFRACTIONATED)
Heparin Unfractionated: 0.37 IU/mL (ref 0.30–0.70)
Heparin Unfractionated: 0.45 IU/mL (ref 0.30–0.70)

## 2016-02-29 LAB — MRSA PCR SCREENING: MRSA by PCR: NEGATIVE

## 2016-02-29 MED ORDER — METOPROLOL TARTRATE 12.5 MG HALF TABLET
12.5000 mg | ORAL_TABLET | Freq: Two times a day (BID) | ORAL | Status: DC
  Administered 2016-02-29 – 2016-03-02 (×5): 12.5 mg via ORAL
  Filled 2016-02-29 (×5): qty 1

## 2016-02-29 MED ORDER — DILTIAZEM HCL 30 MG PO TABS
30.0000 mg | ORAL_TABLET | Freq: Four times a day (QID) | ORAL | Status: DC
  Administered 2016-02-29 – 2016-03-02 (×9): 30 mg via ORAL
  Filled 2016-02-29 (×9): qty 1

## 2016-02-29 MED ORDER — METOPROLOL TARTRATE 5 MG/5ML IV SOLN
5.0000 mg | INTRAVENOUS | Status: DC | PRN
  Administered 2016-02-29: 5 mg via INTRAVENOUS
  Filled 2016-02-29: qty 5

## 2016-02-29 NOTE — Progress Notes (Signed)
ANTICOAGULATION CONSULT NOTE - Follow Up Consult  Pharmacy Consult for Heparin Indication: PE  No Known Allergies  Patient Measurements: Height: 5\' 6"  (167.6 cm) Weight: 278 lb (126.1 kg) IBW/kg (Calculated) : 59.3 Heparin Dosing Weight: 90 kg  Vital Signs: Temp: 97.8 F (36.6 C) (12/12 1621) Temp Source: Oral (12/12 1621) BP: 114/71 (12/13 0130) Pulse Rate: 86 (12/13 0130)  Labs:  Recent Labs  02/28/16 1632 02/28/16 1902 02/28/16 1910 02/29/16 0146 02/29/16 0147  HGB 13.5  --   --   --  12.8  HCT 40.6  --   --   --  38.6  PLT 214  --   --   --  201  APTT  --   --  31  --   --   LABPROT  --   --  14.1  --   --   INR  --   --  1.09  --   --   HEPARINUNFRC  --   --   --  0.37  --   CREATININE 1.69*  --   --   --  1.83*  TROPONINI  --  0.27*  --   --   --     Estimated Creatinine Clearance: 37.7 mL/min (by C-G formula based on SCr of 1.83 mg/dL (H)).  Assessment: 70 y.o. female with high probably PE on VQ scan for heparin  Goal of Therapy:  Heparin level 0.3-0.7 units/ml Monitor platelets by anticoagulation protocol: Yes   Plan:  Continue Heparin at current rate Recheck level in 6 hrs to confirm  Jaideep Pollack, Bronson Curb 02/29/2016,2:42 AM

## 2016-02-29 NOTE — Progress Notes (Signed)
Ref: Horatio Pel, MD   Subjective:  Chest pain, tachycardic and short of breath with little activity.   Objective:  Vital Signs in the last 24 hours: Temp:  [97.8 F (36.6 C)-98.3 F (36.8 C)] 98.3 F (36.8 C) (12/13 1900) Pulse Rate:  [69-98] 85 (12/13 1900) Cardiac Rhythm: Normal sinus rhythm (12/13 1900) Resp:  [14-28] 18 (12/13 1900) BP: (99-134)/(48-96) 112/70 (12/13 1900) SpO2:  [92 %-100 %] 92 % (12/13 1900) Weight:  [125 kg (275 lb 9.2 oz)] 125 kg (275 lb 9.2 oz) (12/13 0801)  Physical Exam: BP Readings from Last 1 Encounters:  02/29/16 112/70     Wt Readings from Last 1 Encounters:  02/29/16 125 kg (275 lb 9.2 oz)    Weight change:  Body mass index is 44.48 kg/m. HEENT: [place]/AT, Eyes-PERL, EOMI, Conjunctiva-Pink, Sclera-Non-icteric Neck: No JVD, No bruit, Trachea midline. Lungs:  Clear, Bilateral. Cardiac:  Regular rhythm, normal S1 and S2, no S3. II/VI systolic murmur. Abdomen:  Soft, non-tender. BS present. Extremities:  Trace lower leg edema present right more than left. No cyanosis. No clubbing. CNS: AxOx3, Cranial nerves grossly intact, moves all 4 extremities.  Skin: Warm and dry.   Intake/Output from previous day: 12/12 0701 - 12/13 0700 In: 15.5 [I.V.:15.5] Out: -     Lab Results: BMET    Component Value Date/Time   NA 139 02/29/2016 0147   NA 138 02/28/2016 1632   NA 142 11/02/2014 1707   K 4.8 02/29/2016 0147   K 4.6 02/28/2016 1632   K 3.8 11/02/2014 1707   CL 105 02/29/2016 0147   CL 108 02/28/2016 1632   CL 106 11/02/2014 1707   CO2 26 02/29/2016 0147   CO2 25 02/28/2016 1632   CO2 29 11/02/2014 1707   GLUCOSE 98 02/29/2016 0147   GLUCOSE 103 (H) 02/28/2016 1632   GLUCOSE 96 11/02/2014 1707   BUN 27 (H) 02/29/2016 0147   BUN 27 (H) 02/28/2016 1632   BUN 39 (H) 11/02/2014 1707   CREATININE 1.83 (H) 02/29/2016 0147   CREATININE 1.69 (H) 02/28/2016 1632   CREATININE 1.38 (H) 11/02/2014 1707   CALCIUM 9.8 02/29/2016 0147    CALCIUM 10.0 02/28/2016 1632   CALCIUM 9.4 11/02/2014 1707   GFRNONAA 26 (L) 02/29/2016 0147   GFRNONAA 29 (L) 02/28/2016 1632   GFRNONAA 26 (L) 11/24/2012 0945   GFRAA 31 (L) 02/29/2016 0147   GFRAA 34 (L) 02/28/2016 1632   GFRAA 30 (L) 11/24/2012 0945   CBC    Component Value Date/Time   WBC 11.4 (H) 02/29/2016 0147   RBC 4.28 02/29/2016 0147   HGB 12.8 02/29/2016 0147   HCT 38.6 02/29/2016 0147   PLT 201 02/29/2016 0147   MCV 90.2 02/29/2016 0147   MCH 29.9 02/29/2016 0147   MCHC 33.2 02/29/2016 0147   RDW 15.0 02/29/2016 0147   LYMPHSABS 1.9 11/02/2014 1707   MONOABS 0.8 11/02/2014 1707   EOSABS 0.0 11/02/2014 1707   BASOSABS 0.0 11/02/2014 1707   HEPATIC Function Panel No results for input(s): PROT in the last 8760 hours.  Invalid input(s):  ALBUMIN,  AST,  ALT,  ALKPHOS,  BILIDIR,  IBILI HEMOGLOBIN A1C No components found for: HGA1C,  MPG CARDIAC ENZYMES Lab Results  Component Value Date   TROPONINI 0.27 ([place]) 02/28/2016   BNP No results for input(s): PROBNP in the last 8760 hours. TSH No results for input(s): TSH in the last 8760 hours. CHOLESTEROL No results for input(s): CHOL in the last 8760  hours.  Scheduled Meds: . aspirin EC  81 mg Oral Daily  . diltiazem  30 mg Oral QID  . levothyroxine  88 mcg Oral QAC breakfast  . magnesium oxide  400 mg Oral Daily  . metoprolol tartrate  12.5 mg Oral BID  . mometasone-formoterol  2 puff Inhalation BID  . pantoprazole  80 mg Oral Daily   Continuous Infusions: . heparin 1,550 Units/hr (02/29/16 1107)   PRN Meds:.albuterol, metoprolol, nitroGLYCERIN, technetium TC 59M diethylenetriame-pentaacetic acid  Assessment/Plan: Acute PE, recurrent Shortness of breath from above Acute DVT, recurrent Moderate pulmonary hypertension Obesity CKD, III Hypothyroidism  Continue IV heparin.   LOS: 1 day    Dixie Dials  MD  02/29/2016, 7:43 PM

## 2016-02-29 NOTE — ED Notes (Signed)
Pt placed in hospital bed

## 2016-02-29 NOTE — Progress Notes (Signed)
ANTICOAGULATION CONSULT NOTE - Follow Up Consult  Pharmacy Consult for Heparin Indication: PE  No Known Allergies  Patient Measurements: Height: 5\' 6"  (167.6 cm) Weight: 275 lb 9.2 oz (125 kg) IBW/kg (Calculated) : 59.3 Heparin Dosing Weight: 90 kg  Vital Signs: Temp: 97.9 F (36.6 C) (12/13 1247) Temp Source: Oral (12/13 1247) BP: 114/63 (12/13 1247) Pulse Rate: 94 (12/13 1247)  Labs:  Recent Labs  02/28/16 1632 02/28/16 1902 02/28/16 1910 02/29/16 0146 02/29/16 0147 02/29/16 1121  HGB 13.5  --   --   --  12.8  --   HCT 40.6  --   --   --  38.6  --   PLT 214  --   --   --  201  --   APTT  --   --  31  --   --   --   LABPROT  --   --  14.1  --   --   --   INR  --   --  1.09  --   --   --   HEPARINUNFRC  --   --   --  0.37  --  0.45  CREATININE 1.69*  --   --   --  1.83*  --   TROPONINI  --  0.27*  --   --   --   --     Estimated Creatinine Clearance: 37.6 mL/min (by C-G formula based on SCr of 1.83 mg/dL (H)).  Assessment: 70 y.o. female with highly probable PE on VQ scan to continue on heparin gtt. H/o PE in 2012 (completed 6 months of warfarin). Heparin level therapeutic at 0.45. Hgb 12.8 stable, Plt wnl, no s/sx of bleeding documented.  Goal of Therapy:  Heparin level 0.3-0.7 units/ml Monitor platelets by anticoagulation protocol: Yes   Plan:  Continue heparin at 1550 units/hr Daily HL, CBC F/u plan to transition to warfarin   Gwenlyn Perking, PharmD PGY1 Pharmacy Resident Pager: [phone] 02/29/2016 1:07 PM

## 2016-02-29 NOTE — Progress Notes (Signed)
*  PRELIMINARY RESULTS* Vascular Ultrasound Bilateral lower extremity venous duplex  has been completed.  Preliminary findings: The right lower extremity is positive for deep vein thrombosis.  Patient has a history of right popliteal clot but on today's exam thrombosis looks acute.  Left lower extremity is negative for deep vein thrombosis in visualized veins.  Preliminary results given to Nurse, Elba Barman. Everrett Coombe 02/29/2016, 1:23 PM

## 2016-02-29 NOTE — ED Notes (Signed)
Pt placed on 2L of O2 for comfort per order of PA

## 2016-02-29 NOTE — Care Management Note (Signed)
Case Management Note  Patient Details  Name: THY GULLIKSON MRN: [id number] Date of Birth: Aug 17, 1945  Subjective/Objective:  Per CMA, Eliquis co-pay will be $47 per 30-day supply and no prior auth required.  Provided pt with card for free 30-day trial.                           Expected Discharge Plan:  [place]   Discharge planning Services  CM Consult, Medication Assistance  Status of Service:  In process, will continue to follow  Girard Cooter, RN 02/29/2016, 3:31 PM

## 2016-02-29 NOTE — Progress Notes (Signed)
PROGRESS NOTE    Shannon Hammond  PPI:[id number] DOB: Jan 18, 1946 DOA: 02/28/2016  PCP: Horatio Pel, MD   Brief Narrative:  Shannon Hammond is a 70 y.o. female with medical history significant of PE in 2012, DVT in R leg at that time.  No longer on any blood thinner other than 81 ASA.  Has had DOE over past couple of months.  Dr. Einar Gip saw patient in office a month ago, nuclear stress test and echo done at that time which are "normal" according to patient.  Today symptoms became acutely worse.  Becomes very SOB and has retrosternal chest pain when ever she does anything at all.  No prior cardiac problems.  Subjective: Tried to get up to go to the commode today. Developed chest pain radiating to her jaw.   Assessment & Plan:   Principal Problem:   Recurrent pulmonary embolism/  Subacute massive pulmonary embolism - h/o PE in 2012 in relation to right leg DVT - VQ high probability of PE in RUL abd LLL - venous duplex shows acute R leg DVT today - EKG >> S1Q3T3 - mild elevation in Troponin-  ( less than 0.5) - ECHO show RV dysfunction- per Dr Doylene Canard, recent ECHO in Dr Annye Asa was normal - PCCM does not feel she needs EKOS at this point as she is compensating - Heparin x 3-5 days recommended by PCCM- have discussed starting Eliquis- case management to look at cost - recommend hematology f/u as outpt to determine duration of anticoagulation- as this is her second episode, I would suspect it would be life long  Active Problems:  A-fib with RVR - having episodes today- home Metoprolol was on hold- I have given her IV lopressor and Dr Doylene Canard ordered Cardizem as well - follow  Chest pain - due to SVT with HR in 180s when she got out of bed- resolved as rate improved- see above.   CKD 3 - Cr stable    DVT prophylaxis: heparin Code Status: full code Family Communication:  Disposition Plan: home in 3-4 days Consultants:   PCCM  Cardiology Procedures:  2 D  ECHO Study Conclusions  - Left ventricle: The cavity size was normal. There was mild   concentric hypertrophy. Systolic function was normal. The   estimated ejection fraction was in the range of 60% to 65%. Wall   motion was normal; there were no regional wall motion   abnormalities. Doppler parameters are consistent with abnormal   left ventricular relaxation (grade 1 diastolic dysfunction). - Mitral valve: There was mild regurgitation. - Left atrium: The atrium was mildly dilated. - Right ventricle: The cavity size was moderately dilated. Wall   thickness was normal. Systolic function was moderately to   severely reduced. - Tricuspid valve: There was moderate regurgitation. - Pulmonary arteries: Systolic pressure was moderately increased.   PA peak pressure: 51 mm Hg (S).  Impressions:  - Right ventricular dysfunction. Antimicrobials:  Anti-infectives    None       Objective: Vitals:   02/29/16 1156 02/29/16 1247 02/29/16 1650 02/29/16 1726  BP: 131/83 114/63 106/75 104/64  Pulse: 87 94 95   Resp:  18 15   Temp:  97.9 F (36.6 C) 97.8 F (36.6 C)   TempSrc:  Oral Oral   SpO2:  97% 93%   Weight:      Height:        Intake/Output Summary (Last 24 hours) at 02/29/16 1730 Last data filed at 02/29/16 1650  Gross per 24 hour  Intake              155 ml  Output              350 ml  Net             -195 ml   Filed Weights   02/28/16 1622 02/29/16 0801  Weight: 126.1 kg (278 lb) 125 kg (275 lb 9.2 oz)    Examination: General exam: Appears comfortable  HEENT: PERRLA, oral mucosa moist, no sclera icterus or thrush Respiratory system: Clear to auscultation. Respiratory effort normal. Cardiovascular system: S1 & S2 heard, RRR.  No murmurs  Gastrointestinal system: Abdomen soft, non-tender, nondistended. Normal bowel sound. No organomegaly Central nervous system: Alert and oriented. No focal neurological deficits. Extremities: No cyanosis, clubbing or edema Skin:  No rashes or ulcers Psychiatry:  Mood & affect appropriate.     Data Reviewed: I have personally reviewed following labs and imaging studies  CBC:  Recent Labs Lab 02/28/16 1632 02/29/16 0147  WBC 11.8* 11.4*  HGB 13.5 12.8  HCT 40.6 38.6  MCV 89.8 90.2  PLT 214 096   Basic Metabolic Panel:  Recent Labs Lab 02/28/16 1632 02/29/16 0147  NA 138 139  K 4.6 4.8  CL 108 105  CO2 25 26  GLUCOSE 103* 98  BUN 27* 27*  CREATININE 1.69* 1.83*  CALCIUM 10.0 9.8   GFR: Estimated Creatinine Clearance: 37.6 mL/min (by C-G formula based on SCr of 1.83 mg/dL (H)). Liver Function Tests: No results for input(s): AST, ALT, ALKPHOS, BILITOT, PROT, ALBUMIN in the last 168 hours. No results for input(s): LIPASE, AMYLASE in the last 168 hours. No results for input(s): AMMONIA in the last 168 hours. Coagulation Profile:  Recent Labs Lab 02/28/16 1910  INR 1.09   Cardiac Enzymes:  Recent Labs Lab 02/28/16 1902  TROPONINI 0.27*   BNP (last 3 results) No results for input(s): PROBNP in the last 8760 hours. HbA1C: No results for input(s): HGBA1C in the last 72 hours. CBG: No results for input(s): GLUCAP in the last 168 hours. Lipid Profile: No results for input(s): CHOL, HDL, LDLCALC, TRIG, CHOLHDL, LDLDIRECT in the last 72 hours. Thyroid Function Tests: No results for input(s): TSH, T4TOTAL, FREET4, T3FREE, THYROIDAB in the last 72 hours. Anemia Panel: No results for input(s): VITAMINB12, FOLATE, FERRITIN, TIBC, IRON, RETICCTPCT in the last 72 hours. Urine analysis: No results found for: COLORURINE, APPEARANCEUR, LABSPEC, PHURINE, GLUCOSEU, HGBUR, BILIRUBINUR, KETONESUR, PROTEINUR, UROBILINOGEN, NITRITE, LEUKOCYTESUR Sepsis Labs: @LABRCNTIP (procalcitonin:4,lacticidven:4) ) Recent Results (from the past 240 hour(s))  MRSA PCR Screening     Status: None   Collection Time: 02/29/16  7:59 AM  Result Value Ref Range Status   MRSA by PCR NEGATIVE NEGATIVE Final    Comment:         The GeneXpert MRSA Assay (FDA approved for NASAL specimens only), is one component of a comprehensive MRSA colonization surveillance program. It is not intended to diagnose MRSA infection nor to guide or monitor treatment for MRSA infections.          Radiology Studies: Dg Chest 2 View  Result Date: 02/28/2016 CLINICAL DATA:  Left-sided chest pain radiating into neck and jaw. Associated shortness of breath. EXAM: CHEST  2 VIEW COMPARISON:  11/15/2005 FINDINGS: The heart size and mediastinal contours are within normal limits. Stable scarring/ atelectasis at the right lung base. There is no evidence of pulmonary edema, consolidation, pneumothorax, nodule or pleural fluid. The visualized skeletal  structures are unremarkable. IMPRESSION: No active cardiopulmonary disease. Electronically Signed   By: Aletta Edouard M.D.   On: 02/28/2016 17:12   Nm Pulmonary Perf And Vent  Result Date: 02/28/2016 CLINICAL DATA:  70 year old female with chest pain. EXAM: NUCLEAR MEDICINE VENTILATION - PERFUSION LUNG SCAN TECHNIQUE: Ventilation images were obtained in multiple projections using inhaled aerosol Tc-18m DTPA. Perfusion images were obtained in multiple projections after intravenous injection of Tc-39m MAA. RADIOPHARMACEUTICALS:  29.0 mCi Technetium-60m DTPA aerosol inhalation and 3.9 mCi Technetium-34m MAA IV COMPARISON:  Chest radiograph dated 02/28/2016 FINDINGS: Ventilation: There is a somewhat wedge-shaped appearing ventilation defect involving the posterior aspect of the right lung likely in the superior segment of the right lower lobe. Perfusion: There is a wedge-shaped perfusion defect in the right lung likely in the superior segment of the right lower lobe which appears larger than the corresponding ventilation defect. A segmental area mismatched perfusion defect is also noted in the left lower lobe. IMPRESSION: High probability for pulmonary embolism. These findings were reviewed in  person with Dr. Doylene Canard at time of the interpretation on 02/28/2016 at 9:50 pm . Electronically Signed   By: Anner Crete M.D.   On: 02/28/2016 22:01      Scheduled Meds: . aspirin EC  81 mg Oral Daily  . diltiazem  30 mg Oral QID  . levothyroxine  88 mcg Oral QAC breakfast  . magnesium oxide  400 mg Oral Daily  . metoprolol tartrate  12.5 mg Oral BID  . mometasone-formoterol  2 puff Inhalation BID  . pantoprazole  80 mg Oral Daily   Continuous Infusions: . heparin 1,550 Units/hr (02/29/16 1107)     LOS: 1 day    Time spent in minutes: 68 min    Danaysha Kirn, MD [facility] Pager: [email or web address] Password [email or web address] 02/29/2016, 5:30 PM

## 2016-02-29 NOTE — Progress Notes (Signed)
Late Entry: 1300: Patient is out bed using [facility].  Called by NT to patient's room and noted that patient's HR is 205 a-fib and patient claimed that she is felling dizzy, chest pain that radiates to her right jaw.   MD was paged, EKG done.  MD informed that patient is supposed to take metoprolol per home med and has not taken this med.  MD stated to call Dr. Doylene Canard and informed him of this event.  Dr. Doylene Canard was paged and new order obtained.  HR slowly going down to NSR after Metoprolol IV was given.  Encouraged patient to stay in bed at this time which she follows.

## 2016-03-01 DIAGNOSIS — N183 Chronic kidney disease, stage 3 (moderate): Secondary | ICD-10-CM

## 2016-03-01 DIAGNOSIS — R0602 Shortness of breath: Secondary | ICD-10-CM

## 2016-03-01 LAB — CBC
HEMATOCRIT: 38.8 % (ref 36.0–46.0)
HEMOGLOBIN: 12.8 g/dL (ref 12.0–15.0)
MCH: 29.6 pg (ref 26.0–34.0)
MCHC: 33 g/dL (ref 30.0–36.0)
MCV: 89.8 fL (ref 78.0–100.0)
Platelets: 211 10*3/uL (ref 150–400)
RBC: 4.32 MIL/uL (ref 3.87–5.11)
RDW: 15 % (ref 11.5–15.5)
WBC: 12.4 10*3/uL — AB (ref 4.0–10.5)

## 2016-03-01 LAB — HEPARIN LEVEL (UNFRACTIONATED): Heparin Unfractionated: 0.38 IU/mL (ref 0.30–0.70)

## 2016-03-01 MED ORDER — WARFARIN SODIUM 5 MG PO TABS
7.5000 mg | ORAL_TABLET | ORAL | Status: DC
Start: 2016-03-01 — End: 2016-03-02
  Filled 2016-03-01: qty 2

## 2016-03-01 MED ORDER — WARFARIN - PHARMACIST DOSING INPATIENT: Freq: Every day | Status: DC

## 2016-03-01 NOTE — Progress Notes (Signed)
Ref: Horatio Pel, MD   Subjective:  Feeling better. No chest pain like yesterday.  Objective:  Vital Signs in the last 24 hours: Temp:  [97.4 F (36.3 C)-98.5 F (36.9 C)] 97.4 F (36.3 C) (12/14 1628) Pulse Rate:  [75-105] 87 (12/14 1628) Cardiac Rhythm: Normal sinus rhythm;Bundle branch block (12/14 1917) Resp:  [17-21] 19 (12/14 1628) BP: (107-132)/(54-89) 107/54 (12/14 1628) SpO2:  [92 %-95 %] 95 % (12/14 1628)  Physical Exam: BP Readings from Last 1 Encounters:  03/01/16 (!) 107/54     Wt Readings from Last 1 Encounters:  02/29/16 125 kg (275 lb 9.2 oz)    Weight change: -1.1 kg (-2 lb 6.8 oz) Body mass index is 44.48 kg/m. HEENT: [place]/AT, Eyes-PERL, EOMI, Conjunctiva-Pink, Sclera-Non-icteric Neck: No JVD, No bruit, Trachea midline. Lungs:  Clear, Bilateral. Cardiac:  Regular rhythm, normal S1 and S2, no S3. II/VI systolic murmur. Abdomen:  Soft, non-tender. BS present. Extremities:  Trace edema present. No cyanosis. No clubbing. CNS: AxOx3, Cranial nerves grossly intact, moves all 4 extremities.  Skin: Warm and dry.   Intake/Output from previous day: 12/13 0701 - 12/14 0700 In: 341 [I.V.:341] Out: 900 [Urine:900]    Lab Results: BMET    Component Value Date/Time   NA 139 02/29/2016 0147   NA 138 02/28/2016 1632   NA 142 11/02/2014 1707   K 4.8 02/29/2016 0147   K 4.6 02/28/2016 1632   K 3.8 11/02/2014 1707   CL 105 02/29/2016 0147   CL 108 02/28/2016 1632   CL 106 11/02/2014 1707   CO2 26 02/29/2016 0147   CO2 25 02/28/2016 1632   CO2 29 11/02/2014 1707   GLUCOSE 98 02/29/2016 0147   GLUCOSE 103 (H) 02/28/2016 1632   GLUCOSE 96 11/02/2014 1707   BUN 27 (H) 02/29/2016 0147   BUN 27 (H) 02/28/2016 1632   BUN 39 (H) 11/02/2014 1707   CREATININE 1.83 (H) 02/29/2016 0147   CREATININE 1.69 (H) 02/28/2016 1632   CREATININE 1.38 (H) 11/02/2014 1707   CALCIUM 9.8 02/29/2016 0147   CALCIUM 10.0 02/28/2016 1632   CALCIUM 9.4 11/02/2014 1707   GFRNONAA 26 (L) 02/29/2016 0147   GFRNONAA 29 (L) 02/28/2016 1632   GFRNONAA 26 (L) 11/24/2012 0945   GFRAA 31 (L) 02/29/2016 0147   GFRAA 34 (L) 02/28/2016 1632   GFRAA 30 (L) 11/24/2012 0945   CBC    Component Value Date/Time   WBC 12.4 (H) 03/01/2016 0158   RBC 4.32 03/01/2016 0158   HGB 12.8 03/01/2016 0158   HCT 38.8 03/01/2016 0158   PLT 211 03/01/2016 0158   MCV 89.8 03/01/2016 0158   MCH 29.6 03/01/2016 0158   MCHC 33.0 03/01/2016 0158   RDW 15.0 03/01/2016 0158   LYMPHSABS 1.9 11/02/2014 1707   MONOABS 0.8 11/02/2014 1707   EOSABS 0.0 11/02/2014 1707   BASOSABS 0.0 11/02/2014 1707   HEPATIC Function Panel No results for input(s): PROT in the last 8760 hours.  Invalid input(s):  ALBUMIN,  AST,  ALT,  ALKPHOS,  BILIDIR,  IBILI HEMOGLOBIN A1C No components found for: HGA1C,  MPG CARDIAC ENZYMES Lab Results  Component Value Date   TROPONINI 0.27 ([place]) 02/28/2016   BNP No results for input(s): PROBNP in the last 8760 hours. TSH No results for input(s): TSH in the last 8760 hours. CHOLESTEROL No results for input(s): CHOL in the last 8760 hours.  Scheduled Meds: . aspirin EC  81 mg Oral Daily  . diltiazem  30 mg Oral  QID  . levothyroxine  88 mcg Oral QAC breakfast  . magnesium oxide  400 mg Oral Daily  . metoprolol tartrate  12.5 mg Oral BID  . mometasone-formoterol  2 puff Inhalation BID  . pantoprazole  80 mg Oral Daily   Continuous Infusions: . heparin 1,550 Units/hr (03/01/16 0600)   PRN Meds:.albuterol, metoprolol, nitroGLYCERIN, technetium TC 1M diethylenetriame-pentaacetic acid  Assessment/Plan: Acute PE Acute DVT Obesity CKD, III Hypothyroidism Obesity  Continue heparin. Start coumadin per pharmacy.   LOS: 2 days    Dixie Dials  MD  03/01/2016, 7:20 PM

## 2016-03-01 NOTE — Progress Notes (Signed)
ANTICOAGULATION CONSULT NOTE - Initial Consult  Pharmacy Consult:  Coumadin Indication: pulmonary embolus  No Known Allergies  Patient Measurements: Height: 5\' 6"  (167.6 cm) Weight: 275 lb 9.2 oz (125 kg) IBW/kg (Calculated) : 59.3  Vital Signs: Temp: 97.4 F (36.3 C) (12/14 1628) Temp Source: Oral (12/14 1628) BP: 107/54 (12/14 1628) Pulse Rate: 87 (12/14 1628)  Labs:  Recent Labs  02/28/16 1632 02/28/16 1902 02/28/16 1910 02/29/16 0146 02/29/16 0147 02/29/16 1121 03/01/16 0158  HGB 13.5  --   --   --  12.8  --  12.8  HCT 40.6  --   --   --  38.6  --  38.8  PLT 214  --   --   --  201  --  211  APTT  --   --  31  --   --   --   --   LABPROT  --   --  14.1  --   --   --   --   INR  --   --  1.09  --   --   --   --   HEPARINUNFRC  --   --   --  0.37  --  0.45 0.38  CREATININE 1.69*  --   --   --  1.83*  --   --   TROPONINI  --  0.27*  --   --   --   --   --     Estimated Creatinine Clearance: 37.6 mL/min (by C-G formula based on SCr of 1.83 mg/dL (H)).   Medical History: Past Medical History:  Diagnosis Date  . Anginal pain ([place])   . Arthritis    BACK AND KNEES  . DVT (deep venous thrombosis) ([place]) 2012   RIGHT LEG  . GERD (gastroesophageal reflux disease)   . Heart murmur   . Hemorrhoids   . History of kidney stones   . History of nocturia   . Hypothyroidism   . PE (pulmonary embolism)    HX PE 2012 - TX'D IN [place]- HOSPITALIZED X 1 WEEK.   NO LONGER ON BLOOD THINNER OTHER THAN 81 MG ASPIRIN  . Seizures ([place]) 2007   ONLY ONCE - THOUGHT TO BE STRESS INDUCED - PT WAS DEALING WITH HER MOTHER'S DEATH  . Shortness of breath    PT TOLD BY HER MEDICAL DOCTOR - SOME LUNG CHANGES DUE TO 2ND HAND SMOKE - SHE WAS GIVEN INHALER TO USE AS NEEDED.     Assessment: 25 YOF with history of PE/DVT in 2012 admitted with new, probable PE and confirmed RLE DVT.  Patient is already on a heparin infusion and now to start Coumadin.  Baseline INR is at 1.09.  No  bleeding reported.   Goal of Therapy:  INR 2-3    Plan:  - Coumadin 7.5mg  PO today - Daily PT / INR   Montez Stryker D. Mina Marble, PharmD, BCPS Pager:  [phone] 03/01/2016, 8:05 PM

## 2016-03-01 NOTE — Progress Notes (Addendum)
PROGRESS NOTE    Shannon Hammond  ION:[id number] DOB: [birth year] DOA: 02/28/2016  PCP: Horatio Pel, MD   Brief Narrative:  Shannon Hammond is a 70 y.o. female with medical history significant of PE in 2012, DVT in R leg at that time.  No longer on any blood thinner other than 81 ASA.  Has had DOE over past couple of months.  Dr. Einar Gip saw patient in office a month ago, nuclear stress test and echo done at that time which are "normal" according to patient.  Today symptoms became acutely worse.  Becomes very SOB and has retrosternal chest pain when ever she does anything at all.  No prior cardiac problems.  Subjective: Tried to get up to go to the commode today. Developed chest pain radiating to her jaw.   Assessment & Plan:   Principal Problem:   Recurrent pulmonary embolism/  Subacute massive pulmonary embolism with RV dysfunction - h/o PE in 2012 in relation to right leg DVT - VQ high probability of PE in RUL abd LLL - venous duplex shows recurrent acute R leg DVT  - EKG >> S1Q3T3 - mild elevation in Troponin-  ( less than 0.5) - ECHO show RV dysfunction- per Dr Doylene Canard, recent ECHO in Dr Annye Asa was normal - PCCM does not feel she needs EKOS at this point as she is compensating - Heparin x 3-5 days recommended by PCCM- have discussed starting Eliquis- case management to look at cost - recommend hematology f/u as outpt to determine duration of anticoagulation- as this is her second episode, I would suspect it would be life long  Active Problems:  A-fib with RVR - frequent episodes on admission - cont Lopressor and Cardizem - no episodes since last night - follow  Chest pain - due to SVT with HR in 180s when she got out of bed- resolved as rate improved- see above.   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 There was mild   concentric hypertrophy. Systolic function was normal. The   estimated ejection fraction was in the range of 60% to 65%. Wall   motion was normal; there were no regional wall motion   abnormalities. Doppler parameters are consistent with abnormal   left ventricular relaxation (grade 1 diastolic dysfunction). - Mitral valve: There was mild regurgitation. - Left atrium: The atrium was mildly dilated. - Right ventricle: The cavity size was moderately dilated. Wall   thickness was normal. Systolic function was moderately to   severely reduced. - Tricuspid valve: There was moderate regurgitation. - Pulmonary arteries: Systolic pressure was moderately increased.   PA peak pressure: 51 mm Hg (S).  Impressions:  - Right ventricular dysfunction.   Antimicrobials:  Anti-infectives    None       Objective: Vitals:   03/01/16 0732 03/01/16 0740 03/01/16 0920 03/01/16 1216  BP:  132/65 107/71 (!) 117/55  Pulse: 87 94 (!) 105 80  Resp: 17 17  19   Temp:  98 F (36.7 C)  97.8 F (36.6 C)  TempSrc:  Oral  Oral  SpO2: 94% 92%  95%  Weight:      Height:        Intake/Output Summary (Last 24 hours) at 03/01/16 1523 Last data  filed at 03/01/16 1453  Gross per 24 hour  Intake           344.36 ml  Output             1300 ml  Net          -955.64 ml   Filed Weights   02/28/16 1622 02/29/16 0801  Weight: 126.1 kg (278 lb) 125 kg (275 lb 9.2 oz)    Examination: General exam: Appears comfortable  HEENT: PERRLA, oral mucosa moist, no sclera icterus or thrush Respiratory system: Clear to auscultation. Respiratory effort normal. Cardiovascular system: S1 & S2 heard, IIRR.  No murmurs  Gastrointestinal system: Abdomen soft, non-tender, nondistended. Normal bowel sound. No organomegaly Central nervous system: Alert and oriented. No focal neurological deficits. Extremities: No cyanosis,  clubbing or edema Skin: No rashes or ulcers Psychiatry:  Mood & affect appropriate.     Data Reviewed: I have personally reviewed following labs and imaging studies  CBC:  Recent Labs Lab 02/28/16 1632 02/29/16 0147 03/01/16 0158  WBC 11.8* 11.4* 12.4*  HGB 13.5 12.8 12.8  HCT 40.6 38.6 38.8  MCV 89.8 90.2 89.8  PLT 214 201 409   Basic Metabolic Panel:  Recent Labs Lab 02/28/16 1632 02/29/16 0147  NA 138 139  K 4.6 4.8  CL 108 105  CO2 25 26  GLUCOSE 103* 98  BUN 27* 27*  CREATININE 1.69* 1.83*  CALCIUM 10.0 9.8   GFR: Estimated Creatinine Clearance: 37.6 mL/min (by C-G formula based on SCr of 1.83 mg/dL (H)). Liver Function Tests: No results for input(s): AST, ALT, ALKPHOS, BILITOT, PROT, ALBUMIN in the last 168 hours. No results for input(s): LIPASE, AMYLASE in the last 168 hours. No results for input(s): AMMONIA in the last 168 hours. Coagulation Profile:  Recent Labs Lab 02/28/16 1910  INR 1.09   Cardiac Enzymes:  Recent Labs Lab 02/28/16 1902  TROPONINI 0.27*   BNP (last 3 results) No results for input(s): PROBNP in the last 8760 hours. HbA1C: No results for input(s): HGBA1C in the last 72 hours. CBG: No results for input(s): GLUCAP in the last 168 hours. Lipid Profile: No results for input(s): CHOL, HDL, LDLCALC, TRIG, CHOLHDL, LDLDIRECT in the last 72 hours. Thyroid Function Tests: No results for input(s): TSH, T4TOTAL, FREET4, T3FREE, THYROIDAB in the last 72 hours. Anemia Panel: No results for input(s): VITAMINB12, FOLATE, FERRITIN, TIBC, IRON, RETICCTPCT in the last 72 hours. Urine analysis: No results found for: COLORURINE, APPEARANCEUR, LABSPEC, PHURINE, GLUCOSEU, HGBUR, BILIRUBINUR, KETONESUR, PROTEINUR, UROBILINOGEN, NITRITE, LEUKOCYTESUR Sepsis Labs: @LABRCNTIP (procalcitonin:4,lacticidven:4) ) Recent Results (from the past 240 hour(s))  MRSA PCR Screening     Status: None   Collection Time: 02/29/16  7:59 AM  Result Value Ref  Range Status   MRSA by PCR NEGATIVE NEGATIVE Final    Comment:        The GeneXpert MRSA Assay (FDA approved for NASAL specimens only), is one component of a comprehensive MRSA colonization surveillance program. It is not intended to diagnose MRSA infection nor to guide or monitor treatment for MRSA infections.          Radiology Studies: Dg Chest 2 View  Result Date: 02/28/2016 CLINICAL DATA:  Left-sided chest pain radiating into neck and jaw. Associated shortness of breath. EXAM: CHEST  2 VIEW COMPARISON:  11/15/2005 FINDINGS: The heart size and mediastinal contours are within normal limits. Stable scarring/ atelectasis at the right lung base. There is no evidence of pulmonary edema, consolidation, pneumothorax, nodule or  pleural fluid. The visualized skeletal structures are unremarkable. IMPRESSION: No active cardiopulmonary disease. Electronically Signed   By: Aletta Edouard M.D.   On: 02/28/2016 17:12   Nm Pulmonary Perf And Vent  Result Date: 02/28/2016 CLINICAL DATA:  70 year old female with chest pain. EXAM: NUCLEAR MEDICINE VENTILATION - PERFUSION LUNG SCAN TECHNIQUE: Ventilation images were obtained in multiple projections using inhaled aerosol Tc-23m DTPA. Perfusion images were obtained in multiple projections after intravenous injection of Tc-38m MAA. RADIOPHARMACEUTICALS:  29.0 mCi Technetium-79m DTPA aerosol inhalation and 3.9 mCi Technetium-83m MAA IV COMPARISON:  Chest radiograph dated 02/28/2016 FINDINGS: Ventilation: There is a somewhat wedge-shaped appearing ventilation defect involving the posterior aspect of the right lung likely in the superior segment of the right lower lobe. Perfusion: There is a wedge-shaped perfusion defect in the right lung likely in the superior segment of the right lower lobe which appears larger than the corresponding ventilation defect. A segmental area mismatched perfusion defect is also noted in the left lower lobe. IMPRESSION: High  probability for pulmonary embolism. These findings were reviewed in person with Dr. Doylene Canard at time of the interpretation on 02/28/2016 at 9:50 pm . Electronically Signed   By: Anner Crete M.D.   On: 02/28/2016 22:01      Scheduled Meds: . aspirin EC  81 mg Oral Daily  . diltiazem  30 mg Oral QID  . levothyroxine  88 mcg Oral QAC breakfast  . magnesium oxide  400 mg Oral Daily  . metoprolol tartrate  12.5 mg Oral BID  . mometasone-formoterol  2 puff Inhalation BID  . pantoprazole  80 mg Oral Daily   Continuous Infusions: . heparin 1,550 Units/hr (03/01/16 0600)     LOS: 2 days    Time spent in minutes: 8 min    Annick Dimaio, MD [facility] Pager: [email or web address] Password [email or web address] 03/01/2016, 3:23 PM

## 2016-03-01 NOTE — Progress Notes (Addendum)
ANTICOAGULATION CONSULT NOTE - Follow Up Consult  Pharmacy Consult for Heparin Indication: PE  No Known Allergies  Patient Measurements: Height: 5\' 6"  (167.6 cm) Weight: 275 lb 9.2 oz (125 kg) IBW/kg (Calculated) : 59.3 Heparin Dosing Weight: 90 kg  Vital Signs: Temp: 98 F (36.7 C) (12/14 0740) Temp Source: Oral (12/14 0740) BP: 132/65 (12/14 0740) Pulse Rate: 94 (12/14 0740)  Labs:  Recent Labs  02/28/16 1632 02/28/16 1902 02/28/16 1910 02/29/16 0146 02/29/16 0147 02/29/16 1121 03/01/16 0158  HGB 13.5  --   --   --  12.8  --  12.8  HCT 40.6  --   --   --  38.6  --  38.8  PLT 214  --   --   --  201  --  211  APTT  --   --  31  --   --   --   --   LABPROT  --   --  14.1  --   --   --   --   INR  --   --  1.09  --   --   --   --   HEPARINUNFRC  --   --   --  0.37  --  0.45 0.38  CREATININE 1.69*  --   --   --  1.83*  --   --   TROPONINI  --  0.27*  --   --   --   --   --     Estimated Creatinine Clearance: 37.6 mL/min (by C-G formula based on SCr of 1.83 mg/dL (H)).  Assessment: 70 y.o. female with highly probable PE on VQ scan to continue on heparin gtt. H/o PE in 2012 (completed 6 months of warfarin). Heparin level therapeutic at 0.38. Hgb 12.8 stable and platelets normal. No overt signs or symptoms of bleeding documented.  Goal of Therapy:  Heparin level 0.3-0.7 units/ml Monitor platelets by anticoagulation protocol: Yes   Plan:  Continue heparin at 1550 units/hr Daily heparin level and CBC Monitor for s/s bleeding  F/u plan to transition to oral anticoagulation   Argie Ramming, PharmD Pharmacy Resident  Pager [phone] 03/01/16 8:26 AM

## 2016-03-01 NOTE — Progress Notes (Signed)
Pharmacy notified that 1400 dose of cardizem given blate,asked to reschedule following doses appropriately.

## 2016-03-02 DIAGNOSIS — I824Y1 Acute embolism and thrombosis of unspecified deep veins of right proximal lower extremity: Secondary | ICD-10-CM

## 2016-03-02 DIAGNOSIS — I82401 Acute embolism and thrombosis of unspecified deep veins of right lower extremity: Secondary | ICD-10-CM

## 2016-03-02 DIAGNOSIS — I50810 Right heart failure, unspecified: Secondary | ICD-10-CM

## 2016-03-02 DIAGNOSIS — I509 Heart failure, unspecified: Secondary | ICD-10-CM

## 2016-03-02 DIAGNOSIS — N184 Chronic kidney disease, stage 4 (severe): Secondary | ICD-10-CM

## 2016-03-02 LAB — PROTIME-INR
INR: 1.05
Prothrombin Time: 13.7 seconds (ref 11.4–15.2)

## 2016-03-02 LAB — BASIC METABOLIC PANEL
Anion gap: 11 (ref 5–15)
BUN: 28 mg/dL — ABNORMAL HIGH (ref 6–20)
CALCIUM: 10 mg/dL (ref 8.9–10.3)
CO2: 24 mmol/L (ref 22–32)
Chloride: 104 mmol/L (ref 101–111)
Creatinine, Ser: 1.83 mg/dL — ABNORMAL HIGH (ref 0.44–1.00)
GFR, EST AFRICAN AMERICAN: 31 mL/min — AB (ref >60)
GFR, EST NON AFRICAN AMERICAN: 26 mL/min — AB (ref >60)
GLUCOSE: 97 mg/dL (ref 65–99)
POTASSIUM: 4.1 mmol/L (ref 3.5–5.1)
Sodium: 139 mmol/L (ref 135–145)

## 2016-03-02 LAB — CBC
HEMATOCRIT: 38.3 % (ref 36.0–46.0)
HEMOGLOBIN: 12.6 g/dL (ref 12.0–15.0)
MCH: 29.7 pg (ref 26.0–34.0)
MCHC: 32.9 g/dL (ref 30.0–36.0)
MCV: 90.3 fL (ref 78.0–100.0)
Platelets: 222 10*3/uL (ref 150–400)
RBC: 4.24 MIL/uL (ref 3.87–5.11)
RDW: 15.2 % (ref 11.5–15.5)
WBC: 12.5 10*3/uL — ABNORMAL HIGH (ref 4.0–10.5)

## 2016-03-02 LAB — TSH: TSH: 2.65 u[IU]/mL (ref 0.350–4.500)

## 2016-03-02 LAB — HEPARIN LEVEL (UNFRACTIONATED): HEPARIN UNFRACTIONATED: 0.33 [IU]/mL (ref 0.30–0.70)

## 2016-03-02 MED ORDER — DILTIAZEM HCL ER 120 MG PO CP12: 120.0000 mg | ORAL_CAPSULE | Freq: Two times a day (BID) | ORAL | 0 refills | Status: DC

## 2016-03-02 MED ORDER — APIXABAN 5 MG PO TABS: 10.0000 mg | ORAL_TABLET | Freq: Two times a day (BID) | ORAL | 0 refills | Status: DC

## 2016-03-02 MED ORDER — APIXABAN 5 MG PO TABS
10.0000 mg | ORAL_TABLET | Freq: Two times a day (BID) | ORAL | Status: DC
  Administered 2016-03-02: 10 mg via ORAL
  Filled 2016-03-02: qty 2

## 2016-03-02 MED ORDER — DILTIAZEM HCL ER 120 MG PO CP12: 120.0000 mg | ORAL_CAPSULE | Freq: Two times a day (BID) | ORAL | 0 refills | Status: AC

## 2016-03-02 MED ORDER — APIXABAN 5 MG PO TABS: 5.0000 mg | ORAL_TABLET | Freq: Two times a day (BID) | ORAL | 0 refills | Status: DC

## 2016-03-02 NOTE — Progress Notes (Signed)
Patient discharged to home with husband. Verbalized understanding of discharge instructions and how to fill prescriptions. VSS, no c/o pain. All belongings returned to patient.

## 2016-03-02 NOTE — Progress Notes (Signed)
SATURATION QUALIFICATIONS: (This note is used to comply with regulatory documentation for home oxygen)  Patient Saturations on Room Air at Rest = 100%  Patient Saturations on Room Air while Ambulating = >93%  Patient Saturations on 0 Liters of oxygen while Ambulating = >93%  Please briefly explain why patient needs home oxygen: NA  Sherie Don, [place], DPT [phone]

## 2016-03-02 NOTE — Discharge Summary (Addendum)
Physician Discharge Summary  Shannon Hammond [id number] DOB: Apr 06, 1945 DOA: 02/28/2016  PCP: Horatio Pel, MD  Admit date: 02/28/2016 Discharge date: 03/02/2016  Admitted From: home  Disposition:  home   Recommendations for Outpatient Follow-up:  1. Bmet in 1-2 wks 2. F/U to decide on resumption of diuretics     Discharge Condition:  stable   CODE STATUS:  Full code   Diet recommendation:  Low sodium, heart heatlhy Consultations:  Cardiology    Discharge Diagnoses:  Principal Problem:   Subacute massive pulmonary embolism (HCC) Active Problems:   Paroxysmal a-fib (HCC)   RVF (right ventricular failure) (HCC)   Morbid obesity (HCC)   Right leg DVT (HCC)   CKD (chronic kidney disease) stage 4, GFR 15-29 ml/min (HCC)    Subjective: She has no complaints of dyspnea, chest pain, palpitations. No other complaints either today.   Brief Summary: Shannon Hammond a 70 y.o.femalewith medical history significant of PE in 2012, DVT in R leg at that time. No longer on any blood thinner other than 81 ASA. Has had DOE over past couple of months. Dr. Einar Gip saw patient in office a month ago, nuclear stress test and echo done at that time which are "normal" according to patient. Today symptoms became acutely worse. Becomes very SOB and has retrosternal chest pain when ever she does anything at all. No prior cardiac problems.  Hospital Course:  Principal Problem:   Recurrent pulmonary embolism/  Subacute massive pulmonary embolism with RV dysfunction - h/o PE in 2012 in relation to right leg DVT - VQ high probability of PE in RUL abd LLL - venous duplex shows recurrent acute R leg DVT  - EKG >> S1Q3T3 - mild elevation in Troponin-  ( less than 0.5) - ECHO show RV dysfunction>> Systolic function was moderately to severely reduced.- Mod Pulm HTN - per Dr Doylene Canard, recent ECHO in Dr Annye Asa was normal - PCCM does not feel she needs EKOS at this point as she  is compensating - Heparin x 3-5 days recommended by PCCM- have discussed starting Eliquis- case management has looked at cost and she can afford it - Dr Doylene Canard has switched her to Eliquis today - ambulatory pulse ox checked - no need for O2 on d/c - recommend hematology f/u as outpt to determine duration of anticoagulation and determine if she has an underlying coagulopathy- as this is her second episode, I would suspect it would be life long  Active Problems:  A-fib with RVR - frequent episodes on admission likely exacerbated by PE and cardiac strain - cont Lopressor (home med) and Cardizem (added by Dr Doylene Canard) - no episodes since 12/13 - follow as out pat  Chest pain - due to SVT with HR in 180s when she got out of bed- resolved as rate improved- see above.   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   HTN - previously on Lopressor - added Cardizem (per Dr Doylene Canard) - recommended by Dr Doylene Canard to stop Triamterene/ HCTZ and Spironolactone which have been on hold in the hospital- BP has been relatively stable - he will f/u in 1 wk to determine if these need to be resumed.    DVT prophylaxis: heparin Code Status: full code Family Communication:  Disposition Plan: follow in SDU Consultants:   PCCM  Cardiology Procedures:  2 D ECHO Study Conclusions  - Left ventricle: The cavity size was normal. There was mild  concentric hypertrophy. Systolic function was normal. The estimated ejection fraction was in the range of 60% to 65%. Wall motion was normal; there were no regional wall motion abnormalities. Doppler parameters are consistent with abnormal left ventricular relaxation (grade 1 diastolic dysfunction). - Mitral valve: There was mild regurgitation. - Left atrium: The atrium was mildly dilated. - Right ventricle: The cavity size was moderately dilated. Wall thickness was normal. Systolic function was moderately  to severely reduced. - Tricuspid valve: There was moderate regurgitation. - Pulmonary arteries: Systolic pressure was moderately increased. PA peak pressure: 51 mm Hg (S).  Impressions:  - Right ventricular dysfunction.   Antimicrobials:     Anti-infectives    None     Discharge Instructions  Discharge Instructions    (HEART FAILURE PATIENTS) Call MD:  Anytime you have any of the following symptoms: 1) 3 pound weight gain in 24 hours or 5 pounds in 1 week 2) shortness of breath, with or without a dry hacking cough 3) swelling in the hands, feet or stomach 4) if you have to sleep on extra pillows at night in order to breathe.    Complete by:  As directed    Diet - low sodium heart healthy    Complete by:  As directed    Increase activity slowly    Complete by:  As directed      Allergies as of 03/02/2016   No Known Allergies     Medication List    STOP taking these medications   aspirin EC 81 MG tablet   ibuprofen 200 MG tablet Commonly known as:  ADVIL,MOTRIN   spironolactone 50 MG tablet Commonly known as:  ALDACTONE   triamterene-hydrochlorothiazide 37.5-25 MG tablet Commonly known as:  MAXZIDE-25     TAKE these medications   apixaban 5 MG Tabs tablet Commonly known as:  ELIQUIS Take 2 tablets (10 mg total) by mouth 2 (two) times daily.   apixaban 5 MG Tabs tablet Commonly known as:  ELIQUIS Take 1 tablet (5 mg total) by mouth 2 (two) times daily.   B-complex with vitamin C tablet Take 1 tablet by mouth daily.   budesonide-formoterol 160-4.5 MCG/ACT inhaler Commonly known as:  SYMBICORT Inhale 2 puffs into the lungs 2 (two) times daily.   diclofenac sodium 1 % Gel Commonly known as:  VOLTAREN Apply 2 g topically 3 (three) times daily.   diltiazem 120 MG 12 hr capsule Commonly known as:  CARDIZEM SR Take 1 capsule (120 mg total) by mouth 2 (two) times daily.   levothyroxine 88 MCG tablet Commonly known as:  SYNTHROID, LEVOTHROID Take  88 mcg by mouth daily before breakfast.   magnesium oxide 400 MG tablet Commonly known as:  MAG-OX Take 400 mg by mouth daily.   meloxicam 15 MG tablet Commonly known as:  MOBIC Take 15 mg by mouth daily.   metoprolol 50 MG tablet Commonly known as:  LOPRESSOR Take 12.5 mg by mouth 2 (two) times daily.   omeprazole 40 MG capsule Commonly known as:  PRILOSEC Take 40 mg by mouth daily.   VENTOLIN HFA 108 (90 Base) MCG/ACT inhaler Generic drug:  albuterol Inhale 2 puffs into the lungs every 4 (four) hours as needed for shortness of breath.   VITAMIN D3 PO Take 1 tablet by mouth daily.      Follow-up Information    Horatio Pel, MD. Schedule an appointment as soon as possible for a visit in 1 month(s).   Specialty:  Internal Medicine Contact  information: [address] [place] [address] [phone]        Birdie Riddle, MD. Schedule an appointment as soon as possible for a visit in 1 week(s).   Specialty:  Cardiology Contact information: [place] [address] [phone]          No Known Allergies   Procedures/Studies: 2 D ECHO Study Conclusions  - Left ventricle: The cavity size was normal. There was mild concentric hypertrophy. Systolic function was normal. The estimated ejection fraction was in the range of 60% to 65%. Wall motion was normal; there were no regional wall motion abnormalities. Doppler parameters are consistent with abnormal left ventricular relaxation (grade 1 diastolic dysfunction). - Mitral valve: There was mild regurgitation. - Left atrium: The atrium was mildly dilated. - Right ventricle: The cavity size was moderately dilated. Wall thickness was normal. Systolic function was moderately to severely reduced. - Tricuspid valve: There was moderate regurgitation. - Pulmonary arteries: Systolic pressure was moderately increased. PA peak pressure: 51 mm Hg  (S).  Impressions:  - Right ventricular dysfunction.  Dg Chest 2 View  Result Date: 02/28/2016 CLINICAL DATA:  Left-sided chest pain radiating into neck and jaw. Associated shortness of breath. EXAM: CHEST  2 VIEW COMPARISON:  11/15/2005 FINDINGS: The heart size and mediastinal contours are within normal limits. Stable scarring/ atelectasis at the right lung base. There is no evidence of pulmonary edema, consolidation, pneumothorax, nodule or pleural fluid. The visualized skeletal structures are unremarkable. IMPRESSION: No active cardiopulmonary disease. Electronically Signed   By: Aletta Edouard M.D.   On: 02/28/2016 17:12   Nm Pulmonary Perf And Vent  Result Date: 02/28/2016 CLINICAL DATA:  70 year old female with chest pain. EXAM: NUCLEAR MEDICINE VENTILATION - PERFUSION LUNG SCAN TECHNIQUE: Ventilation images were obtained in multiple projections using inhaled aerosol Tc-65m DTPA. Perfusion images were obtained in multiple projections after intravenous injection of Tc-84m MAA. RADIOPHARMACEUTICALS:  29.0 mCi Technetium-66m DTPA aerosol inhalation and 3.9 mCi Technetium-73m MAA IV COMPARISON:  Chest radiograph dated 02/28/2016 FINDINGS: Ventilation: There is a somewhat wedge-shaped appearing ventilation defect involving the posterior aspect of the right lung likely in the superior segment of the right lower lobe. Perfusion: There is a wedge-shaped perfusion defect in the right lung likely in the superior segment of the right lower lobe which appears larger than the corresponding ventilation defect. A segmental area mismatched perfusion defect is also noted in the left lower lobe. IMPRESSION: High probability for pulmonary embolism. These findings were reviewed in person with Dr. Doylene Canard at time of the interpretation on 02/28/2016 at 9:50 pm . Electronically Signed   By: Anner Crete M.D.   On: 02/28/2016 22:01       Discharge Exam: Vitals:   03/02/16 0752 03/02/16 0800  BP:  139/67   Pulse: 88 77  Resp: 15 (!) 21  Temp:  (!) 96.8 F (36 C)   Vitals:   03/02/16 0300 03/02/16 0400 03/02/16 0752 03/02/16 0800  BP:  (!) 97/54  139/67  Pulse: 71 67 88 77  Resp: 18 12 15  (!) 21  Temp: 97.7 F (36.5 C)   (!) 96.8 F (36 C)  TempSrc: Oral   Axillary  SpO2: 94% 95% 94% 95%  Weight:      Height:        General: Pt is alert, awake, not in acute distress Cardiovascular: RRR, S1/S2 +, no rubs, no gallops Respiratory: CTA bilaterally, no wheezing, no rhonchi Abdominal: Soft, NT, ND, bowel sounds +  Extremities: no edema, no cyanosis    The results of significant diagnostics from this hospitalization (including imaging, microbiology, ancillary and laboratory) are listed below for reference.     Microbiology: Recent Results (from the past 240 hour(s))  MRSA PCR Screening     Status: None   Collection Time: 02/29/16  7:59 AM  Result Value Ref Range Status   MRSA by PCR NEGATIVE NEGATIVE Final    Comment:        The GeneXpert MRSA Assay (FDA approved for NASAL specimens only), is one component of a comprehensive MRSA colonization surveillance program. It is not intended to diagnose MRSA infection nor to guide or monitor treatment for MRSA infections.      Labs: BNP (last 3 results)  Recent Labs  02/28/16 1632  BNP 16.1   Basic Metabolic Panel:  Recent Labs Lab 02/28/16 1632 02/29/16 0147 03/02/16 0915  NA 138 139 139  K 4.6 4.8 4.1  CL 108 105 104  CO2 25 26 24   GLUCOSE 103* 98 97  BUN 27* 27* 28*  CREATININE 1.69* 1.83* 1.83*  CALCIUM 10.0 9.8 10.0   Liver Function Tests: No results for input(s): AST, ALT, ALKPHOS, BILITOT, PROT, ALBUMIN in the last 168 hours. No results for input(s): LIPASE, AMYLASE in the last 168 hours. No results for input(s): AMMONIA in the last 168 hours. CBC:  Recent Labs Lab 02/28/16 1632 02/29/16 0147 03/01/16 0158 03/02/16 0204  WBC 11.8* 11.4* 12.4* 12.5*  HGB 13.5 12.8 12.8 12.6  HCT 40.6 38.6  38.8 38.3  MCV 89.8 90.2 89.8 90.3  PLT 214 201 211 222   Cardiac Enzymes:  Recent Labs Lab 02/28/16 1902  TROPONINI 0.27*   BNP: Invalid input(s): POCBNP CBG: No results for input(s): GLUCAP in the last 168 hours. D-Dimer  Recent Labs  02/28/16 1902  DDIMER 13.20*   Hgb A1c No results for input(s): HGBA1C in the last 72 hours. Lipid Profile No results for input(s): CHOL, HDL, LDLCALC, TRIG, CHOLHDL, LDLDIRECT in the last 72 hours. Thyroid function studies  Recent Labs  03/02/16 0915  TSH 2.650   Anemia work up No results for input(s): VITAMINB12, FOLATE, FERRITIN, TIBC, IRON, RETICCTPCT in the last 72 hours. Urinalysis No results found for: COLORURINE, APPEARANCEUR, [place], [place], [place], [place], [place], [place], PROTEINUR, UROBILINOGEN, NITRITE, LEUKOCYTESUR Sepsis Labs Invalid input(s): PROCALCITONIN,  WBC,  LACTICIDVEN Microbiology Recent Results (from the past 240 hour(s))  MRSA PCR Screening     Status: None   Collection Time: 02/29/16  7:59 AM  Result Value Ref Range Status   MRSA by PCR NEGATIVE NEGATIVE Final    Comment:        The GeneXpert MRSA Assay (FDA approved for NASAL specimens only), is one component of a comprehensive MRSA colonization surveillance program. It is not intended to diagnose MRSA infection nor to guide or monitor treatment for MRSA infections.      Time coordinating discharge: Over 30 minutes  SIGNED:   Debbe Odea, MD  [facility] 03/02/2016, 11:13 AM Pager   If 7PM-7AM, please contact night-coverage [email or web address] Password [email or web address]

## 2016-03-02 NOTE — Progress Notes (Signed)
Ref: Horatio Pel, MD   Subjective:  Feeling better. Prefers newer anticoagulants over warfarin. Discussed with pharmacy, will start Eliquis 10 mg. BID x 1 week then 5 mg. BID. Also discussed diet with decrease protein load and adequate hydration for renal function and activity for cardiovascular benefit.  Objective:  Vital Signs in the last 24 hours: Temp:  [96.8 F (36 C)-97.8 F (36.6 C)] 96.8 F (36 C) (12/15 0800) Pulse Rate:  [67-105] 77 (12/15 0800) Cardiac Rhythm: Normal sinus rhythm (12/15 0700) Resp:  [12-22] 21 (12/15 0800) BP: (97-139)/(45-71) 139/67 (12/15 0800) SpO2:  [88 %-99 %] 95 % (12/15 0800)  Physical Exam: BP Readings from Last 1 Encounters:  03/02/16 139/67     Wt Readings from Last 1 Encounters:  02/29/16 125 kg (275 lb 9.2 oz)    Weight change:  Body mass index is 44.48 kg/m. HEENT: [place]/AT, Eyes-PERL, EOMI, Conjunctiva-Pink, Sclera-Non-icteric Neck: No JVD, No bruit, Trachea midline. Lungs:  Clear, Bilateral. Cardiac:  Regular rhythm, normal S1 and S2, no S3. II/VI systolic murmur. Abdomen:  Soft, non-tender. BS present. Extremities:  Trace edema present. No cyanosis. No clubbing. CNS: AxOx3, Cranial nerves grossly intact, moves all 4 extremities.  Skin: Warm and dry.   Intake/Output from previous day: 12/14 0701 - 12/15 0700 In: 601.7 [P.O.:240; I.V.:361.7] Out: 1325 [Urine:1325]    Lab Results: BMET    Component Value Date/Time   NA 139 02/29/2016 0147   NA 138 02/28/2016 1632   NA 142 11/02/2014 1707   K 4.8 02/29/2016 0147   K 4.6 02/28/2016 1632   K 3.8 11/02/2014 1707   CL 105 02/29/2016 0147   CL 108 02/28/2016 1632   CL 106 11/02/2014 1707   CO2 26 02/29/2016 0147   CO2 25 02/28/2016 1632   CO2 29 11/02/2014 1707   GLUCOSE 98 02/29/2016 0147   GLUCOSE 103 (H) 02/28/2016 1632   GLUCOSE 96 11/02/2014 1707   BUN 27 (H) 02/29/2016 0147   BUN 27 (H) 02/28/2016 1632   BUN 39 (H) 11/02/2014 1707   CREATININE 1.83 (H)  02/29/2016 0147   CREATININE 1.69 (H) 02/28/2016 1632   CREATININE 1.38 (H) 11/02/2014 1707   CALCIUM 9.8 02/29/2016 0147   CALCIUM 10.0 02/28/2016 1632   CALCIUM 9.4 11/02/2014 1707   GFRNONAA 26 (L) 02/29/2016 0147   GFRNONAA 29 (L) 02/28/2016 1632   GFRNONAA 26 (L) 11/24/2012 0945   GFRAA 31 (L) 02/29/2016 0147   GFRAA 34 (L) 02/28/2016 1632   GFRAA 30 (L) 11/24/2012 0945   CBC    Component Value Date/Time   WBC 12.5 (H) 03/02/2016 0204   RBC 4.24 03/02/2016 0204   HGB 12.6 03/02/2016 0204   HCT 38.3 03/02/2016 0204   PLT 222 03/02/2016 0204   MCV 90.3 03/02/2016 0204   MCH 29.7 03/02/2016 0204   MCHC 32.9 03/02/2016 0204   RDW 15.2 03/02/2016 0204   LYMPHSABS 1.9 11/02/2014 1707   MONOABS 0.8 11/02/2014 1707   EOSABS 0.0 11/02/2014 1707   BASOSABS 0.0 11/02/2014 1707   HEPATIC Function Panel No results for input(s): PROT in the last 8760 hours.  Invalid input(s):  ALBUMIN,  AST,  ALT,  ALKPHOS,  BILIDIR,  IBILI HEMOGLOBIN A1C No components found for: HGA1C,  MPG CARDIAC ENZYMES Lab Results  Component Value Date   TROPONINI 0.27 ([place]) 02/28/2016   BNP No results for input(s): PROBNP in the last 8760 hours. TSH No results for input(s): TSH in the last 8760 hours. CHOLESTEROL  No results for input(s): CHOL in the last 8760 hours.  Scheduled Meds: . apixaban  10 mg Oral BID  . aspirin EC  81 mg Oral Daily  . diltiazem  30 mg Oral QID  . levothyroxine  88 mcg Oral QAC breakfast  . magnesium oxide  400 mg Oral Daily  . metoprolol tartrate  12.5 mg Oral BID  . mometasone-formoterol  2 puff Inhalation BID  . pantoprazole  80 mg Oral Daily  . Warfarin - Pharmacist Dosing Inpatient   Does not apply q1800   Continuous Infusions: PRN Meds:.albuterol, metoprolol, nitroGLYCERIN, technetium TC 29M diethylenetriame-pentaacetic acid  Assessment/Plan: Acute PE Acute DVT Obesity CKD, III Hypothyroidism Obesity  Change warfarin to Eliquis high dose x 1 week then  normal dose. F/U in 1 week post discharge. PT consult.   LOS: 3 days    Dixie Dials  MD  03/02/2016, 8:54 AM

## 2016-03-02 NOTE — Evaluation (Signed)
Physical Therapy Evaluation Patient Details Name: Shannon Hammond MRN: [id number] DOB: [birth year] Today's Date: 03/02/2016   History of Present Illness  Pt is a 70 y/o female admitted secondary to SOB and DOE. PMH including but not limited to hx of PE and DVTs in 2012.  Clinical Impression  Pt presented supine in bed with HOB elevated, awake and willing to participate in therapy session. Prior to admission, pt was independent with all functional mobility. Pt currently at supervision level for all mobility. During ambulation on RA, pt's SPO2 maintained >93% and HR increased to 105 bpm and quickly decreased to 88 bpm with sitting rest break. No further acute needs identified at this time. PT signing off.    Follow Up Recommendations No PT follow up    Equipment Recommendations  None recommended by PT    Recommendations for Other Services       Precautions / Restrictions Restrictions Weight Bearing Restrictions: No      Mobility  Bed Mobility Overal bed mobility: Modified Independent             General bed mobility comments: HOB elevated, increased time  Transfers Overall transfer level: Needs assistance Equipment used: None Transfers: Sit to/from Stand Sit to Stand: Supervision         General transfer comment: increased time  Ambulation/Gait Ambulation/Gait assistance: Supervision Ambulation Distance (Feet): 100 Feet Assistive device: None Gait Pattern/deviations: Step-through pattern;Decreased stride length;Wide base of support Gait velocity: decreased Gait velocity interpretation: Below normal speed for age/gender General Gait Details: pt with reports of pain in bilateral feet with ambulation, limiting distance  Stairs            Wheelchair Mobility    Modified Rankin (Stroke Patients Only)       Balance Overall balance assessment: Needs assistance Sitting-balance support: Feet supported;No upper extremity supported Sitting balance-Leahy  Scale: Good     Standing balance support: During functional activity;No upper extremity supported Standing balance-Leahy Scale: Fair                               Pertinent Vitals/Pain Pain Assessment: No/denies pain    Home Living Family/patient expects to be discharged to:: Private residence Living Arrangements: Spouse/significant other;Other relatives (grandson) Available Help at Discharge: Family;Available PRN/intermittently           Home Equipment: None      Prior Function Level of Independence: Independent               Hand Dominance        Extremity/Trunk Assessment   Upper Extremity Assessment Upper Extremity Assessment: Overall WFL for tasks assessed    Lower Extremity Assessment Lower Extremity Assessment: Overall WFL for tasks assessed       Communication   Communication: No difficulties  Cognition Arousal/Alertness: Awake/alert Behavior During Therapy: WFL for tasks assessed/performed Overall Cognitive Status: Within Functional Limits for tasks assessed                      General Comments      Exercises     Assessment/Plan    PT Assessment Patent does not need any further PT services  PT Problem List            PT Treatment Interventions      PT Goals (Current goals can be found in the Care Plan section)  Acute Rehab PT Goals Patient Stated Goal: return home  Frequency     Barriers to discharge        Co-evaluation               End of Session Equipment Utilized During Treatment: Gait belt Activity Tolerance: Patient tolerated treatment well Patient left: in chair;with call bell/phone within reach Nurse Communication: Mobility status         Time: 1201-1215 PT Time Calculation (min) (ACUTE ONLY): 14 min   Charges:   PT Evaluation $PT Eval Moderate Complexity: 1 Procedure     PT G CodesClearnce Sorrel Abdoulaye Drum 03/02/2016, 12:43 PM Sherie Don, [place], DPT [phone]

## 2016-03-02 NOTE — Discharge Instructions (Signed)
Dr Doylene Canard will tell you if and when to resume your BP meds which are on hold.   Please take all your medications with you for your next visit with your Primary MD. Please request your Primary MD to go over all hospital test results at the follow up. Please ask your Primary MD to get all Hospital records sent to his/her office.  If you experience worsening of your admission symptoms, develop shortness of breath, chest pain, suicidal or homicidal thoughts or a life threatening emergency, you must seek medical attention immediately by calling 911 or calling your MD.  Dennis Bast must read the complete instructions/literature along with all the possible adverse reactions/side effects for all the medicines you take including new medications that have been prescribed to you. Take new medicines after you have completely understood and accpet all the possible adverse reactions/side effects.   Do not drive when taking pain medications or sedatives.    Do not take more than prescribed Pain, Sleep and Anxiety Medications  If you have smoked or chewed Tobacco in the last 2 yrs please stop. Stop any regular alcohol and or recreational drug use.  Wear Seat belts while driving.   Information on my medicine - ELIQUIS (apixaban)  This medication education was reviewed with me or my healthcare representative as part of my discharge preparation.    Why was Eliquis prescribed for you? Eliquis was prescribed to treat blood clots that may have been found in the veins of your legs (deep vein thrombosis) or in your lungs (pulmonary embolism) and to reduce the risk of them occurring again.  What do You need to know about Eliquis ? The starting dose is 10 mg (two 5 mg tablets) taken TWICE daily for the FIRST SEVEN (7) DAYS, then on 12/22  the dose is reduced to ONE 5 mg tablet taken TWICE daily.  Eliquis may be taken with or without food.   Try to take the dose about the same time in the morning and in the  evening. If you have difficulty swallowing the tablet whole please discuss with your pharmacist how to take the medication safely.  Take Eliquis exactly as prescribed and DO NOT stop taking Eliquis without talking to the doctor who prescribed the medication.  Stopping may increase your risk of developing a new blood clot.  Refill your prescription before you run out.  After discharge, you should have regular check-up appointments with your healthcare provider that is prescribing your Eliquis.    What do you do if you miss a dose? If a dose of ELIQUIS is not taken at the scheduled time, take it as soon as possible on the same day and twice-daily administration should be resumed. The dose should not be doubled to make up for a missed dose.  Important Safety Information A possible side effect of Eliquis is bleeding. You should call your healthcare provider right away if you experience any of the following: ? Bleeding from an injury or your nose that does not stop. ? Unusual colored urine (red or dark brown) or unusual colored stools (red or black). ? Unusual bruising for unknown reasons. ? A serious fall or if you hit your head (even if there is no bleeding).  Some medicines may interact with Eliquis and might increase your risk of bleeding or clotting while on Eliquis. To help avoid this, consult your healthcare provider or pharmacist prior to using any new prescription or non-prescription medications, including herbals, vitamins, non-steroidal anti-inflammatory drugs (NSAIDs) and  supplements.  This website has more information on Eliquis (apixaban): http://www.eliquis.com/eliquis/home

## 2016-03-08 DIAGNOSIS — I80221 Phlebitis and thrombophlebitis of right popliteal vein: Secondary | ICD-10-CM | POA: Diagnosis not present

## 2016-03-08 DIAGNOSIS — N184 Chronic kidney disease, stage 4 (severe): Secondary | ICD-10-CM | POA: Diagnosis not present

## 2016-03-08 DIAGNOSIS — R072 Precordial pain: Secondary | ICD-10-CM | POA: Diagnosis not present

## 2016-03-08 DIAGNOSIS — I2609 Other pulmonary embolism with acute cor pulmonale: Secondary | ICD-10-CM | POA: Diagnosis not present

## 2016-03-08 DIAGNOSIS — I50811 Acute right heart failure: Secondary | ICD-10-CM | POA: Diagnosis not present

## 2016-03-09 DIAGNOSIS — R0609 Other forms of dyspnea: Secondary | ICD-10-CM | POA: Diagnosis not present

## 2016-03-09 DIAGNOSIS — Z79899 Other long term (current) drug therapy: Secondary | ICD-10-CM | POA: Diagnosis not present

## 2016-03-09 DIAGNOSIS — M170 Bilateral primary osteoarthritis of knee: Secondary | ICD-10-CM | POA: Diagnosis not present

## 2016-03-09 DIAGNOSIS — Z86711 Personal history of pulmonary embolism: Secondary | ICD-10-CM | POA: Diagnosis not present

## 2016-03-09 DIAGNOSIS — Z09 Encounter for follow-up examination after completed treatment for conditions other than malignant neoplasm: Secondary | ICD-10-CM | POA: Diagnosis not present

## 2016-04-03 DIAGNOSIS — J45909 Unspecified asthma, uncomplicated: Secondary | ICD-10-CM | POA: Diagnosis not present

## 2016-04-03 DIAGNOSIS — I10 Essential (primary) hypertension: Secondary | ICD-10-CM | POA: Diagnosis not present

## 2016-04-03 DIAGNOSIS — Z86711 Personal history of pulmonary embolism: Secondary | ICD-10-CM | POA: Diagnosis not present

## 2016-04-03 DIAGNOSIS — N189 Chronic kidney disease, unspecified: Secondary | ICD-10-CM | POA: Diagnosis not present

## 2016-04-06 ENCOUNTER — Other Ambulatory Visit (INDEPENDENT_AMBULATORY_CARE_PROVIDER_SITE_OTHER): Payer: Self-pay | Admitting: Orthopaedic Surgery

## 2016-04-09 DIAGNOSIS — N184 Chronic kidney disease, stage 4 (severe): Secondary | ICD-10-CM | POA: Diagnosis not present

## 2016-04-09 DIAGNOSIS — R072 Precordial pain: Secondary | ICD-10-CM | POA: Diagnosis not present

## 2016-04-09 DIAGNOSIS — I80221 Phlebitis and thrombophlebitis of right popliteal vein: Secondary | ICD-10-CM | POA: Diagnosis not present

## 2016-04-09 DIAGNOSIS — I50811 Acute right heart failure: Secondary | ICD-10-CM | POA: Diagnosis not present

## 2016-04-09 DIAGNOSIS — I2609 Other pulmonary embolism with acute cor pulmonale: Secondary | ICD-10-CM | POA: Diagnosis not present

## 2016-04-18 DIAGNOSIS — R5383 Other fatigue: Secondary | ICD-10-CM | POA: Diagnosis not present

## 2016-04-18 DIAGNOSIS — R040 Epistaxis: Secondary | ICD-10-CM | POA: Diagnosis not present

## 2016-04-18 DIAGNOSIS — Z7901 Long term (current) use of anticoagulants: Secondary | ICD-10-CM | POA: Diagnosis not present

## 2016-04-30 ENCOUNTER — Ambulatory Visit (INDEPENDENT_AMBULATORY_CARE_PROVIDER_SITE_OTHER): Payer: Commercial Managed Care - HMO | Admitting: Physician Assistant

## 2016-06-07 DIAGNOSIS — N184 Chronic kidney disease, stage 4 (severe): Secondary | ICD-10-CM | POA: Diagnosis not present

## 2016-06-07 DIAGNOSIS — I2609 Other pulmonary embolism with acute cor pulmonale: Secondary | ICD-10-CM | POA: Diagnosis not present

## 2016-06-07 DIAGNOSIS — I50811 Acute right heart failure: Secondary | ICD-10-CM | POA: Diagnosis not present

## 2016-06-07 DIAGNOSIS — R040 Epistaxis: Secondary | ICD-10-CM | POA: Diagnosis not present

## 2016-06-07 DIAGNOSIS — I80221 Phlebitis and thrombophlebitis of right popliteal vein: Secondary | ICD-10-CM | POA: Diagnosis not present

## 2016-06-08 DIAGNOSIS — R040 Epistaxis: Secondary | ICD-10-CM | POA: Diagnosis not present

## 2016-06-11 ENCOUNTER — Ambulatory Visit (INDEPENDENT_AMBULATORY_CARE_PROVIDER_SITE_OTHER): Payer: Commercial Managed Care - HMO | Admitting: Physician Assistant

## 2016-06-11 ENCOUNTER — Other Ambulatory Visit (INDEPENDENT_AMBULATORY_CARE_PROVIDER_SITE_OTHER): Payer: Self-pay | Admitting: Orthopaedic Surgery

## 2016-06-14 ENCOUNTER — Ambulatory Visit (INDEPENDENT_AMBULATORY_CARE_PROVIDER_SITE_OTHER): Payer: Commercial Managed Care - HMO | Admitting: Physician Assistant

## 2016-06-14 DIAGNOSIS — M1712 Unilateral primary osteoarthritis, left knee: Secondary | ICD-10-CM

## 2016-06-14 DIAGNOSIS — M1711 Unilateral primary osteoarthritis, right knee: Secondary | ICD-10-CM

## 2016-06-14 DIAGNOSIS — M170 Bilateral primary osteoarthritis of knee: Secondary | ICD-10-CM

## 2016-06-14 MED ORDER — METHYLPREDNISOLONE ACETATE 40 MG/ML IJ SUSP
40.0000 mg | INTRAMUSCULAR | Status: AC | PRN
  Administered 2016-06-14: 40 mg via INTRA_ARTICULAR

## 2016-06-14 MED ORDER — LIDOCAINE HCL 1 % IJ SOLN
3.0000 mL | INTRAMUSCULAR | Status: AC | PRN
  Administered 2016-06-14: 3 mL

## 2016-06-14 NOTE — Progress Notes (Signed)
Office Visit Note   Patient: Shannon Hammond           Date of Birth: [birth year]           MRN: [id number] Visit Date: 06/14/2016              Requested by: Deland Pretty, MD [address] [place], [place] [address] PCP: Horatio Pel, MD   Assessment & Plan: Visit Diagnoses:  1. Bilateral primary osteoarthritis of knee     Plan: We'll see her back on an as-needed basis for injections in her knees. Due to the fact that she got no relief with a Monovisc injections would recommend cortisone injections. She understands she can only have these every 3 months Discuss quad strengthening Follow-Up Instructions: Return if symptoms worsen or fail to improve.   Orders:  No orders of the defined types were placed in this encounter.  No orders of the defined types were placed in this encounter.     Procedures: Large Joint Inj Date/Time: 06/14/2016 4:35 PM Performed by: Pete Pelt Authorized by: Pete Pelt   Consent Given by:  Patient Indications:  Pain Location:  Knee Site:  L knee Needle Size:  25 G Approach:  Anterolateral Ultrasound Guidance: No   Fluoroscopic Guidance: No   Medications:  40 mg methylPREDNISolone acetate 40 MG/ML; 3 mL lidocaine 1 % Aspiration Attempted: No   Patient tolerance:  Patient tolerated the procedure well with no immediate complications Large Joint Inj Date/Time: 06/14/2016 4:36 PM Performed by: Pete Pelt Authorized by: Pete Pelt   Consent Given by:  Patient Indications:  Pain Location:  Knee Site:  R knee Needle Size:  22 G Approach:  Anterolateral Ultrasound Guidance: No   Fluoroscopic Guidance: No   Medications:  40 mg methylPREDNISolone acetate 40 MG/ML; 3 mL lidocaine 1 % Aspiration Attempted: No   Patient tolerance:  Patient tolerated the procedure well with no immediate complications     Clinical Data: No additional findings.   Subjective: No chief complaint on  file.   HPI Shannon Hammond returns today follow-up bilateral knee osteoarthritis. She states that the mild disc injection she got 02/27/2016 did not give her any real relief. Unfortunately the next day she ended up at the hospital due to bilateral pulmonary embolisms. She is currently on Eliquis side due to her pulmonary embolisms  Review of Systems   Objective: Vital Signs: There were no vitals taken for this visit.  Physical Exam  Constitutional: She is oriented to person, place, and time. She appears well-developed and well-nourished. No distress.  Neurological: She is alert and oriented to person, place, and time.  Skin: Skin is warm and dry.     Ortho Exam Lateral knees overall good range of motion of both knees. No effusion no abnormal warmth no erythema and no signs of infection Specialty Comments:  No specialty comments available.  Imaging: No results found.   PMFS History: Patient Active Problem List   Diagnosis Date Noted  . RVF (right ventricular failure) ([place]) 03/02/2016  . Morbid obesity ([place]) 03/02/2016  . Right leg DVT ([place]) 03/02/2016  . CKD (chronic kidney disease) stage 4, GFR 15-29 ml/min (HCC) 03/02/2016  . Subacute massive pulmonary embolism ([place]) 02/29/2016  . Paroxysmal a-fib ([place]) 02/29/2016  . Chest pain 11/03/2014  . Dyspnea 11/02/2014   Past Medical History:  Diagnosis Date  . Anginal pain ([place])   . Arthritis    BACK AND KNEES  .  DVT (deep venous thrombosis) ([place]) 2012   RIGHT LEG  . GERD (gastroesophageal reflux disease)   . Heart murmur   . Hemorrhoids   . History of kidney stones   . History of nocturia   . Hypothyroidism   . PE (pulmonary embolism)    HX PE 2012 - TX'D IN [facility]- HOSPITALIZED X 1 WEEK.   NO LONGER ON BLOOD THINNER OTHER THAN 81 MG ASPIRIN  . Seizures ([place]) 2007   ONLY ONCE - THOUGHT TO BE STRESS INDUCED - PT WAS DEALING WITH HER MOTHER'S DEATH  . Shortness of breath    PT TOLD BY HER MEDICAL DOCTOR - SOME  LUNG CHANGES DUE TO 2ND HAND SMOKE - SHE WAS GIVEN INHALER TO USE AS NEEDED.    Family History  Problem Relation Age of Onset  . Heart attack Father   . Clotting disorder Neg Hx     Past Surgical History:  Procedure Laterality Date  . BREAST SURGERY  1993   CYST REMOVED LEFT BREAST  . CHOLECYSTECTOMY  LATE 1990'S  . CYSTOSCOPY WITH RETROGRADE PYELOGRAM, URETEROSCOPY AND STENT PLACEMENT Right 11/27/2012   Procedure: CYSTOSCOPY WITH RETROGRADE PYELOGRAM, URETEROSCOPY, stone basketry AND STENT PLACEMENT;  Surgeon: Franchot Gallo, MD;  Location: [facility];  Service: Urology;  Laterality: Right;  . HOLMIUM LASER APPLICATION Right 10/07/5748   Procedure: HOLMIUM LASER APPLICATION;  Surgeon: Franchot Gallo, MD;  Location: [facility];  Service: Urology;  Laterality: Right;  . LITHOTRIPSY     BOTH KIDNEYS   Social History   Occupational History  . Not on file.   Social History Main Topics  . Smoking status: Never Smoker  . Smokeless tobacco: Never Used  . Alcohol use No  . Drug use: No  . Sexual activity: Not on file

## 2016-06-26 DIAGNOSIS — H25812 Combined forms of age-related cataract, left eye: Secondary | ICD-10-CM | POA: Diagnosis not present

## 2016-06-26 DIAGNOSIS — H2511 Age-related nuclear cataract, right eye: Secondary | ICD-10-CM | POA: Diagnosis not present

## 2016-06-26 DIAGNOSIS — H10413 Chronic giant papillary conjunctivitis, bilateral: Secondary | ICD-10-CM | POA: Diagnosis not present

## 2016-07-24 DIAGNOSIS — I80221 Phlebitis and thrombophlebitis of right popliteal vein: Secondary | ICD-10-CM | POA: Diagnosis not present

## 2016-07-24 DIAGNOSIS — M25562 Pain in left knee: Secondary | ICD-10-CM | POA: Diagnosis not present

## 2016-07-24 DIAGNOSIS — N184 Chronic kidney disease, stage 4 (severe): Secondary | ICD-10-CM | POA: Diagnosis not present

## 2016-07-24 DIAGNOSIS — M25561 Pain in right knee: Secondary | ICD-10-CM | POA: Diagnosis not present

## 2016-08-15 ENCOUNTER — Other Ambulatory Visit (INDEPENDENT_AMBULATORY_CARE_PROVIDER_SITE_OTHER): Payer: Self-pay | Admitting: Orthopaedic Surgery

## 2016-09-13 DIAGNOSIS — R600 Localized edema: Secondary | ICD-10-CM | POA: Diagnosis not present

## 2016-09-13 DIAGNOSIS — R0602 Shortness of breath: Secondary | ICD-10-CM | POA: Diagnosis not present

## 2016-10-03 DIAGNOSIS — R600 Localized edema: Secondary | ICD-10-CM | POA: Diagnosis not present

## 2016-10-03 DIAGNOSIS — Z86718 Personal history of other venous thrombosis and embolism: Secondary | ICD-10-CM | POA: Diagnosis not present

## 2016-10-09 DIAGNOSIS — L03115 Cellulitis of right lower limb: Secondary | ICD-10-CM | POA: Diagnosis not present

## 2016-10-09 DIAGNOSIS — Z86718 Personal history of other venous thrombosis and embolism: Secondary | ICD-10-CM | POA: Diagnosis not present

## 2016-10-24 DIAGNOSIS — I425 Other restrictive cardiomyopathy: Secondary | ICD-10-CM | POA: Diagnosis not present

## 2016-10-24 DIAGNOSIS — I340 Nonrheumatic mitral (valve) insufficiency: Secondary | ICD-10-CM | POA: Diagnosis not present

## 2016-10-24 DIAGNOSIS — I361 Nonrheumatic tricuspid (valve) insufficiency: Secondary | ICD-10-CM | POA: Diagnosis not present

## 2016-10-24 DIAGNOSIS — I5022 Chronic systolic (congestive) heart failure: Secondary | ICD-10-CM | POA: Diagnosis not present

## 2016-11-15 DIAGNOSIS — I10 Essential (primary) hypertension: Secondary | ICD-10-CM | POA: Diagnosis not present

## 2016-11-15 DIAGNOSIS — E039 Hypothyroidism, unspecified: Secondary | ICD-10-CM | POA: Diagnosis not present

## 2016-11-15 DIAGNOSIS — N390 Urinary tract infection, site not specified: Secondary | ICD-10-CM | POA: Diagnosis not present

## 2016-11-15 DIAGNOSIS — K219 Gastro-esophageal reflux disease without esophagitis: Secondary | ICD-10-CM | POA: Diagnosis not present

## 2016-11-15 DIAGNOSIS — E7800 Pure hypercholesterolemia, unspecified: Secondary | ICD-10-CM | POA: Diagnosis not present

## 2016-11-15 DIAGNOSIS — Z0000 Encounter for general adult medical examination without abnormal findings: Secondary | ICD-10-CM | POA: Diagnosis not present

## 2016-11-15 DIAGNOSIS — E559 Vitamin D deficiency, unspecified: Secondary | ICD-10-CM | POA: Diagnosis not present

## 2016-11-16 DIAGNOSIS — R739 Hyperglycemia, unspecified: Secondary | ICD-10-CM | POA: Diagnosis not present

## 2016-11-16 DIAGNOSIS — R749 Abnormal serum enzyme level, unspecified: Secondary | ICD-10-CM | POA: Diagnosis not present

## 2016-11-22 ENCOUNTER — Other Ambulatory Visit: Payer: Self-pay | Admitting: Internal Medicine

## 2016-11-22 DIAGNOSIS — N183 Chronic kidney disease, stage 3 (moderate): Secondary | ICD-10-CM | POA: Diagnosis not present

## 2016-11-22 DIAGNOSIS — E039 Hypothyroidism, unspecified: Secondary | ICD-10-CM | POA: Diagnosis not present

## 2016-11-22 DIAGNOSIS — R748 Abnormal levels of other serum enzymes: Secondary | ICD-10-CM

## 2016-11-22 DIAGNOSIS — Z0001 Encounter for general adult medical examination with abnormal findings: Secondary | ICD-10-CM | POA: Diagnosis not present

## 2016-11-22 DIAGNOSIS — K5901 Slow transit constipation: Secondary | ICD-10-CM | POA: Diagnosis not present

## 2016-12-03 ENCOUNTER — Ambulatory Visit
Admission: RE | Admit: 2016-12-03 | Discharge: 2016-12-03 | Disposition: A | Discharge status: Home / Self Care | Payer: Commercial Managed Care - HMO | Source: Ambulatory Visit | Attending: Internal Medicine | Admitting: Internal Medicine

## 2016-12-03 DIAGNOSIS — R748 Abnormal levels of other serum enzymes: Secondary | ICD-10-CM

## 2016-12-17 DIAGNOSIS — N183 Chronic kidney disease, stage 3 (moderate): Secondary | ICD-10-CM | POA: Diagnosis not present

## 2016-12-17 DIAGNOSIS — I129 Hypertensive chronic kidney disease with stage 1 through stage 4 chronic kidney disease, or unspecified chronic kidney disease: Secondary | ICD-10-CM | POA: Diagnosis not present

## 2016-12-17 DIAGNOSIS — Z6839 Body mass index (BMI) 39.0-39.9, adult: Secondary | ICD-10-CM | POA: Diagnosis not present

## 2016-12-17 DIAGNOSIS — Z87442 Personal history of urinary calculi: Secondary | ICD-10-CM | POA: Diagnosis not present

## 2016-12-17 DIAGNOSIS — R600 Localized edema: Secondary | ICD-10-CM | POA: Diagnosis not present

## 2016-12-17 DIAGNOSIS — R399 Unspecified symptoms and signs involving the genitourinary system: Secondary | ICD-10-CM | POA: Diagnosis not present

## 2016-12-17 DIAGNOSIS — R32 Unspecified urinary incontinence: Secondary | ICD-10-CM | POA: Diagnosis not present

## 2016-12-25 ENCOUNTER — Other Ambulatory Visit: Payer: Self-pay | Admitting: Nephrology

## 2016-12-25 DIAGNOSIS — N183 Chronic kidney disease, stage 3 unspecified: Secondary | ICD-10-CM

## 2016-12-25 DIAGNOSIS — R32 Unspecified urinary incontinence: Secondary | ICD-10-CM

## 2016-12-25 DIAGNOSIS — I129 Hypertensive chronic kidney disease with stage 1 through stage 4 chronic kidney disease, or unspecified chronic kidney disease: Secondary | ICD-10-CM

## 2016-12-25 DIAGNOSIS — R600 Localized edema: Secondary | ICD-10-CM

## 2016-12-26 ENCOUNTER — Ambulatory Visit (HOSPITAL_COMMUNITY)
Admission: EM | Admit: 2016-12-26 | Discharge: 2016-12-26 | Disposition: A | Discharge status: Home / Self Care | Payer: Commercial Managed Care - HMO | Attending: Family Medicine | Admitting: Family Medicine

## 2016-12-26 ENCOUNTER — Encounter (HOSPITAL_COMMUNITY): Payer: Self-pay | Admitting: Emergency Medicine

## 2016-12-26 DIAGNOSIS — M79674 Pain in right toe(s): Secondary | ICD-10-CM

## 2016-12-26 DIAGNOSIS — M109 Gout, unspecified: Secondary | ICD-10-CM

## 2016-12-26 MED ORDER — PREDNISONE 10 MG (21) PO TBPK: ORAL_TABLET | ORAL | 0 refills | Status: DC

## 2016-12-26 NOTE — ED Triage Notes (Signed)
Pt reports right foot pain that started over the weekend.  Pt has redness and swelling to the medial side of her foot and right great toe.

## 2016-12-26 NOTE — ED Provider Notes (Signed)
[facility]    CSN: [id number] Arrival date & time: 12/26/16  1000     History   Chief Complaint Chief Complaint  Patient presents with  . Foot Pain    right    HPI Shannon Hammond is a 71 y.o. female.   71 year old female presents with right foot and great toe pain that started about 4 days ago. Has continued to get worse with more redness and swelling from great toe to medial aspect of foot. Unable to bend toe due to swelling and pain. Having difficulty bearing weight due to pain. No distinct injury. Has history of DVT in right leg in 2017 as well as recent cellulitis of the right leg and is currently on Cipro for the skin infection. 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 On multiple chronic medications for management. Unable to take NSAIDs due to kidney disease. Having issues with recurrent kidney stones and has renal U/S scheduled for tomorrow. Has tried applying Voltaren gel to right toe/foot and has taken Tylenol with no relief.   The history is provided by the patient.    Past Medical History:  Diagnosis Date  . Anginal pain ([place])   . Arthritis    BACK AND KNEES  . DVT (deep venous thrombosis) ([place]) 2012   RIGHT LEG  . GERD (gastroesophageal reflux disease)   . Heart murmur   . Hemorrhoids   . History of kidney stones   . History of nocturia   . Hypothyroidism   . PE (pulmonary embolism)    HX PE 2012 - TX'D IN [place]- HOSPITALIZED X 1 WEEK.   NO LONGER ON BLOOD THINNER OTHER THAN 81 MG ASPIRIN  . Seizures ([place]) 2007   ONLY ONCE - THOUGHT TO BE STRESS INDUCED - PT WAS DEALING WITH HER MOTHER'S DEATH  . Shortness of breath    PT TOLD BY HER MEDICAL DOCTOR - SOME LUNG CHANGES DUE TO 2ND HAND SMOKE - SHE WAS GIVEN INHALER TO USE AS NEEDED.    Patient Active Problem List   Diagnosis Date Noted  . RVF (right ventricular failure)  ([place]) 03/02/2016  . Morbid obesity ([place]) 03/02/2016  . Right leg DVT ([place]) 03/02/2016  . CKD (chronic kidney disease) stage 4, GFR 15-29 ml/min (HCC) 03/02/2016  . Subacute massive pulmonary embolism ([place]) 02/29/2016  . Paroxysmal A-fib ([place]) 02/29/2016  . Chest pain 11/03/2014  . Dyspnea 11/02/2014    Past Surgical History:  Procedure Laterality Date  . BREAST SURGERY  1993   CYST REMOVED LEFT BREAST  . CHOLECYSTECTOMY  LATE 1990'S  . CYSTOSCOPY WITH RETROGRADE PYELOGRAM, URETEROSCOPY AND STENT PLACEMENT Right 11/27/2012   Procedure: CYSTOSCOPY WITH RETROGRADE PYELOGRAM, URETEROSCOPY, stone basketry AND STENT PLACEMENT;  Surgeon: Franchot Gallo, MD;  [facility];  Service: Urology;  Laterality: Right;  . HOLMIUM LASER APPLICATION Right 1/91/4782   Procedure: HOLMIUM LASER APPLICATION;  Surgeon: Franchot Gallo, MD;  [facility];  Service: Urology;  Laterality: Right;  . LITHOTRIPSY     BOTH KIDNEYS    OB History    No data available       Home Medications    Prior to Admission medications   Medication Sig Start Date End Date Taking? Authorizing Provider  apixaban (ELIQUIS) 5 MG TABS tablet Take 2 tablets (10 mg total) by mouth 2 (two) times daily. 03/02/16  Yes Debbe Odea, MD  B Complex-C (B-COMPLEX WITH VITAMIN C) tablet Take 1 tablet by mouth daily.   Yes [provider]  budesonide-formoterol (SYMBICORT) 160-4.5 MCG/ACT inhaler Inhale 2 puffs into the lungs 2 (two) times daily.   Yes [provider]  Cholecalciferol (VITAMIN D3 PO) Take 1 tablet by mouth daily.   Yes [provider]  ciprofloxacin (CIPRO) 500 MG tablet Take 500 mg by mouth 2 (two) times daily.   Yes [provider]  diclofenac sodium (VOLTAREN) 1 % GEL Apply 2 g topically 3 (three) times daily.   Yes [provider]  diltiazem (CARDIZEM SR) 120 MG 12 hr capsule Take 1 capsule (120 mg total) by mouth 2 (two) times daily. 03/02/16  Yes Debbe Odea, MD  furosemide (LASIX) 20 MG tablet Take 20 mg by mouth daily.   Yes [provider]  levothyroxine (SYNTHROID, LEVOTHROID) 88 MCG tablet Take 88 mcg by mouth daily before breakfast.   Yes [provider]  magnesium oxide (MAG-OX) 400 MG tablet Take 400 mg by mouth daily.   Yes [provider]  metoprolol (LOPRESSOR) 50 MG tablet Take 12.5 mg by mouth 2 (two) times daily.    Yes [provider]  omeprazole (PRILOSEC) 40 MG capsule Take 40 mg by mouth daily.   Yes [provider]  VENTOLIN HFA 108 (90 BASE) MCG/ACT inhaler Inhale 2 puffs into the lungs every 4 (four) hours as needed for shortness of breath.  09/21/14  Yes [provider]  predniSONE (STERAPRED UNI-PAK 21 TAB) 10 MG (21) TBPK tablet Take 6 tablets by mouth on first day then decrease by 1 tablet each day until finished on day 6. 12/26/16   Ellis Mehaffey, Nicholes Stairs, NP    Family History Family History  Problem Relation Age of Onset  . Heart attack Father   . Clotting disorder Neg Hx     Social History Social History  Substance Use Topics  . Smoking status: Never Smoker  . Smokeless tobacco: Never Used  . Alcohol use No     Allergies   Patient has no known allergies.   Review of Systems Review of Systems  Constitutional: Positive for activity change and fatigue. Negative for appetite change, chills and fever.  Cardiovascular: Positive for leg swelling.  Gastrointestinal: Negative for nausea and vomiting.  Genitourinary: Positive for difficulty urinating, flank pain and frequency. Negative for dysuria.  Musculoskeletal: Positive for arthralgias, back pain, gait problem, joint swelling and myalgias.  Skin: Positive for color change. Negative for rash and wound.  Neurological: Negative for weakness, light-headedness, numbness and headaches.  Hematological: Negative for adenopathy. Bruises/bleeds easily.  Psychiatric/Behavioral: Negative.      Physical Exam Triage  Vital Signs ED Triage Vitals  Enc Vitals Group     BP 12/26/16 1016 133/71     Pulse Rate 12/26/16 1016 71     Resp 12/26/16 1016 18     Temp 12/26/16 1016 98.6 F (37 C)     Temp Source 12/26/16 1016 Oral     SpO2 12/26/16 1016 95 %     Weight --      Height --      Head Circumference --      Peak Flow --      Pain Score 12/26/16 1024 10     Pain Loc --      Pain Edu? --      Excl. in [place]? --    No data found.   Updated Vital Signs BP  133/71 (BP Location: Left Arm)   Pulse 71   Temp 98.6 F (37 C) (Oral)   Resp 18   SpO2 95%   Visual Acuity Right Eye Distance:   Left Eye Distance:   Bilateral Distance:    Right Eye Near:   Left Eye Near:    Bilateral Near:     Physical Exam  Constitutional: She is oriented to person, place, and time. She appears well-developed and well-nourished. No distress.  Patient sitting comfortably in wheel chair.   HENT:  Head: Normocephalic and atraumatic.  Eyes: Conjunctivae and EOM are normal.  Neck: Normal range of motion.  Cardiovascular: Normal rate.   Pulmonary/Chest: Effort normal.  Musculoskeletal: She exhibits edema and tenderness.       Right foot: There is decreased range of motion, tenderness, bony tenderness and swelling. There is normal capillary refill, no deformity and no laceration.       Feet:  Right great toe red, swollen and tender, especially at distal joint and distal aspect of metatarsal. Redness extends mid-medical aspect of metatarsal bones. Can passively move toe but with pain. Toe nails discolored, raised and deformed. Good dorsal pedal pulses and normal capillary refill. No distinct neuro deficits noted.   Neurological: She is alert and oriented to person, place, and time.  Skin: Skin is warm. Capillary refill takes less than 2 seconds. There is erythema.  Psychiatric: She has a normal mood and affect. Her behavior is normal. Judgment and thought content normal.     UC Treatments / Results  Labs (all  labs ordered are listed, but only abnormal results are displayed) Labs Reviewed - No data to display  EKG  EKG Interpretation None       Radiology No results found.  Procedures Procedures (including critical care time)  Medications Ordered in UC Medications - No data to display   Initial Impression / Assessment and Plan / UC Course  I have reviewed the triage vital signs and the nursing notes.  Pertinent labs & imaging results that were available during my care of the patient were reviewed by me and considered in my medical decision making (see chart for details).    Reviewed case with Dr. Mannie Stabile. Discussed with patient that she most likely has gout. Colchicine contraindicated with poor kidney function. NSAIDs also not recommended. May trial Prednisone 10mg  6 day dose pack as directed. Continue to take Tylenol 1000mg  every 8 hours as needed for pain. Recommend eat foods that are low in purine (information provided). Follow-up with her PCP in 4 to 5 days for recheck or go to ER if pain worsens or does not improve within 48 hours.   Final Clinical Impressions(s) / UC Diagnoses   Final diagnoses:  Pain of right great toe  Acute gout involving toe of right foot, unspecified cause    New Prescriptions Discharge Medication List as of 12/26/2016 11:24 AM    START taking these medications   Details  predniSONE (STERAPRED UNI-PAK 21 TAB) 10 MG (21) TBPK tablet Take 6 tablets by mouth on first day then decrease by 1 tablet each day until finished on day 6., Normal         Controlled Substance Prescriptions [place] Controlled Substance Registry consulted? Not Applicable   Katy Apo, NP 12/26/16 2006

## 2016-12-26 NOTE — Discharge Instructions (Signed)
Recommend start Prednisone 10mg  - take 6 tablets today then decrease by 1 tablet each day until finished on day 6. May take Tylenol as needed for pain. Recommend eat foods that are low in purine (see attached list). Recommend follow-up with your PCP in 5 days for recheck or go to ER if pain worsens or does not improve within 48 hours.

## 2016-12-27 ENCOUNTER — Ambulatory Visit
Admission: RE | Admit: 2016-12-27 | Discharge: 2016-12-27 | Disposition: A | Discharge status: Home / Self Care | Payer: Commercial Managed Care - HMO | Source: Ambulatory Visit | Attending: Nephrology | Admitting: Nephrology

## 2016-12-27 DIAGNOSIS — N183 Chronic kidney disease, stage 3 unspecified: Secondary | ICD-10-CM

## 2016-12-27 DIAGNOSIS — R600 Localized edema: Secondary | ICD-10-CM

## 2016-12-27 DIAGNOSIS — R32 Unspecified urinary incontinence: Secondary | ICD-10-CM

## 2016-12-27 DIAGNOSIS — I129 Hypertensive chronic kidney disease with stage 1 through stage 4 chronic kidney disease, or unspecified chronic kidney disease: Secondary | ICD-10-CM

## 2017-02-20 DIAGNOSIS — I80221 Phlebitis and thrombophlebitis of right popliteal vein: Secondary | ICD-10-CM | POA: Diagnosis not present

## 2017-02-20 DIAGNOSIS — R072 Precordial pain: Secondary | ICD-10-CM | POA: Diagnosis not present

## 2017-02-20 DIAGNOSIS — N184 Chronic kidney disease, stage 4 (severe): Secondary | ICD-10-CM | POA: Diagnosis not present

## 2017-02-20 DIAGNOSIS — R0602 Shortness of breath: Secondary | ICD-10-CM | POA: Diagnosis not present

## 2017-03-25 DIAGNOSIS — Z01419 Encounter for gynecological examination (general) (routine) without abnormal findings: Secondary | ICD-10-CM | POA: Diagnosis not present

## 2017-03-25 DIAGNOSIS — I10 Essential (primary) hypertension: Secondary | ICD-10-CM | POA: Diagnosis not present

## 2017-03-25 DIAGNOSIS — Z23 Encounter for immunization: Secondary | ICD-10-CM | POA: Diagnosis not present

## 2017-03-25 DIAGNOSIS — Z1231 Encounter for screening mammogram for malignant neoplasm of breast: Secondary | ICD-10-CM | POA: Diagnosis not present

## 2017-03-25 DIAGNOSIS — Z1212 Encounter for screening for malignant neoplasm of rectum: Secondary | ICD-10-CM | POA: Diagnosis not present

## 2017-04-04 DIAGNOSIS — Z6839 Body mass index (BMI) 39.0-39.9, adult: Secondary | ICD-10-CM | POA: Diagnosis not present

## 2017-04-04 DIAGNOSIS — Z87442 Personal history of urinary calculi: Secondary | ICD-10-CM | POA: Diagnosis not present

## 2017-04-04 DIAGNOSIS — R600 Localized edema: Secondary | ICD-10-CM | POA: Diagnosis not present

## 2017-04-04 DIAGNOSIS — N183 Chronic kidney disease, stage 3 (moderate): Secondary | ICD-10-CM | POA: Diagnosis not present

## 2017-04-04 DIAGNOSIS — N390 Urinary tract infection, site not specified: Secondary | ICD-10-CM | POA: Diagnosis not present

## 2017-04-04 DIAGNOSIS — I129 Hypertensive chronic kidney disease with stage 1 through stage 4 chronic kidney disease, or unspecified chronic kidney disease: Secondary | ICD-10-CM | POA: Diagnosis not present

## 2017-04-04 DIAGNOSIS — R32 Unspecified urinary incontinence: Secondary | ICD-10-CM | POA: Diagnosis not present

## 2017-04-04 DIAGNOSIS — D631 Anemia in chronic kidney disease: Secondary | ICD-10-CM | POA: Diagnosis not present

## 2017-05-30 DIAGNOSIS — R0602 Shortness of breath: Secondary | ICD-10-CM | POA: Diagnosis not present

## 2017-05-30 DIAGNOSIS — I80221 Phlebitis and thrombophlebitis of right popliteal vein: Secondary | ICD-10-CM | POA: Diagnosis not present

## 2017-05-30 DIAGNOSIS — N184 Chronic kidney disease, stage 4 (severe): Secondary | ICD-10-CM | POA: Diagnosis not present

## 2017-05-30 DIAGNOSIS — R072 Precordial pain: Secondary | ICD-10-CM | POA: Diagnosis not present

## 2017-09-03 DIAGNOSIS — R0602 Shortness of breath: Secondary | ICD-10-CM | POA: Diagnosis not present

## 2017-09-03 DIAGNOSIS — R072 Precordial pain: Secondary | ICD-10-CM | POA: Diagnosis not present

## 2017-09-03 DIAGNOSIS — I425 Other restrictive cardiomyopathy: Secondary | ICD-10-CM | POA: Diagnosis not present

## 2017-10-01 DIAGNOSIS — H10413 Chronic giant papillary conjunctivitis, bilateral: Secondary | ICD-10-CM | POA: Diagnosis not present

## 2017-10-01 DIAGNOSIS — H2511 Age-related nuclear cataract, right eye: Secondary | ICD-10-CM | POA: Diagnosis not present

## 2017-12-05 DIAGNOSIS — I5022 Chronic systolic (congestive) heart failure: Secondary | ICD-10-CM | POA: Diagnosis not present

## 2017-12-05 DIAGNOSIS — N184 Chronic kidney disease, stage 4 (severe): Secondary | ICD-10-CM | POA: Diagnosis not present

## 2017-12-05 DIAGNOSIS — E034 Atrophy of thyroid (acquired): Secondary | ICD-10-CM | POA: Diagnosis not present

## 2017-12-05 DIAGNOSIS — I10 Essential (primary) hypertension: Secondary | ICD-10-CM | POA: Diagnosis not present

## 2017-12-09 DIAGNOSIS — E039 Hypothyroidism, unspecified: Secondary | ICD-10-CM | POA: Diagnosis not present

## 2017-12-09 DIAGNOSIS — N390 Urinary tract infection, site not specified: Secondary | ICD-10-CM | POA: Diagnosis not present

## 2017-12-09 DIAGNOSIS — N183 Chronic kidney disease, stage 3 (moderate): Secondary | ICD-10-CM | POA: Diagnosis not present

## 2017-12-09 DIAGNOSIS — E559 Vitamin D deficiency, unspecified: Secondary | ICD-10-CM | POA: Diagnosis not present

## 2017-12-09 DIAGNOSIS — I10 Essential (primary) hypertension: Secondary | ICD-10-CM | POA: Diagnosis not present

## 2017-12-09 DIAGNOSIS — K219 Gastro-esophageal reflux disease without esophagitis: Secondary | ICD-10-CM | POA: Diagnosis not present

## 2017-12-10 DIAGNOSIS — R945 Abnormal results of liver function studies: Secondary | ICD-10-CM | POA: Diagnosis not present

## 2017-12-10 DIAGNOSIS — Z79899 Other long term (current) drug therapy: Secondary | ICD-10-CM | POA: Diagnosis not present

## 2017-12-19 DIAGNOSIS — K219 Gastro-esophageal reflux disease without esophagitis: Secondary | ICD-10-CM | POA: Diagnosis not present

## 2017-12-19 DIAGNOSIS — E039 Hypothyroidism, unspecified: Secondary | ICD-10-CM | POA: Diagnosis not present

## 2017-12-19 DIAGNOSIS — I10 Essential (primary) hypertension: Secondary | ICD-10-CM | POA: Diagnosis not present

## 2017-12-19 DIAGNOSIS — Z23 Encounter for immunization: Secondary | ICD-10-CM | POA: Diagnosis not present

## 2017-12-19 DIAGNOSIS — Z0000 Encounter for general adult medical examination without abnormal findings: Secondary | ICD-10-CM | POA: Diagnosis not present

## 2017-12-19 DIAGNOSIS — N390 Urinary tract infection, site not specified: Secondary | ICD-10-CM | POA: Diagnosis not present

## 2017-12-20 DIAGNOSIS — N183 Chronic kidney disease, stage 3 (moderate): Secondary | ICD-10-CM | POA: Diagnosis not present

## 2017-12-20 DIAGNOSIS — Z87442 Personal history of urinary calculi: Secondary | ICD-10-CM | POA: Diagnosis not present

## 2017-12-20 DIAGNOSIS — Z6839 Body mass index (BMI) 39.0-39.9, adult: Secondary | ICD-10-CM | POA: Diagnosis not present

## 2017-12-20 DIAGNOSIS — I129 Hypertensive chronic kidney disease with stage 1 through stage 4 chronic kidney disease, or unspecified chronic kidney disease: Secondary | ICD-10-CM | POA: Diagnosis not present

## 2017-12-20 DIAGNOSIS — R32 Unspecified urinary incontinence: Secondary | ICD-10-CM | POA: Diagnosis not present

## 2017-12-20 DIAGNOSIS — N390 Urinary tract infection, site not specified: Secondary | ICD-10-CM | POA: Diagnosis not present

## 2017-12-20 DIAGNOSIS — R600 Localized edema: Secondary | ICD-10-CM | POA: Diagnosis not present

## 2018-04-03 DIAGNOSIS — Z01419 Encounter for gynecological examination (general) (routine) without abnormal findings: Secondary | ICD-10-CM | POA: Diagnosis not present

## 2018-04-03 DIAGNOSIS — Z1212 Encounter for screening for malignant neoplasm of rectum: Secondary | ICD-10-CM | POA: Diagnosis not present

## 2018-04-21 DIAGNOSIS — Z1231 Encounter for screening mammogram for malignant neoplasm of breast: Secondary | ICD-10-CM | POA: Diagnosis not present

## 2018-05-06 DIAGNOSIS — I2609 Other pulmonary embolism with acute cor pulmonale: Secondary | ICD-10-CM | POA: Diagnosis not present

## 2018-05-06 DIAGNOSIS — N184 Chronic kidney disease, stage 4 (severe): Secondary | ICD-10-CM | POA: Diagnosis not present

## 2018-05-06 DIAGNOSIS — I50811 Acute right heart failure: Secondary | ICD-10-CM | POA: Diagnosis not present

## 2018-05-06 DIAGNOSIS — I80221 Phlebitis and thrombophlebitis of right popliteal vein: Secondary | ICD-10-CM | POA: Diagnosis not present

## 2018-05-19 DIAGNOSIS — Z86010 Personal history of colonic polyps: Secondary | ICD-10-CM | POA: Diagnosis not present

## 2018-05-19 DIAGNOSIS — Z8371 Family history of colonic polyps: Secondary | ICD-10-CM | POA: Diagnosis not present

## 2018-05-19 DIAGNOSIS — R1033 Periumbilical pain: Secondary | ICD-10-CM | POA: Diagnosis not present

## 2018-05-22 ENCOUNTER — Other Ambulatory Visit: Payer: Self-pay | Admitting: Gastroenterology

## 2018-05-22 DIAGNOSIS — Z86010 Personal history of colonic polyps: Secondary | ICD-10-CM

## 2018-05-22 DIAGNOSIS — Z8371 Family history of colonic polyps: Secondary | ICD-10-CM

## 2018-06-09 ENCOUNTER — Ambulatory Visit
Admission: RE | Admit: 2018-06-09 | Discharge: 2018-06-09 | Disposition: A | Discharge status: Home / Self Care | Payer: Commercial Managed Care - HMO | Source: Ambulatory Visit | Attending: Gastroenterology | Admitting: Gastroenterology

## 2018-06-09 DIAGNOSIS — Z8371 Family history of colonic polyps: Secondary | ICD-10-CM

## 2018-06-09 DIAGNOSIS — Z86010 Personal history of colonic polyps: Secondary | ICD-10-CM

## 2018-06-10 ENCOUNTER — Inpatient Hospital Stay: Admission: RE | Admit: 2018-06-10 | Payer: Commercial Managed Care - HMO | Source: Ambulatory Visit

## 2018-09-10 DIAGNOSIS — R0602 Shortness of breath: Secondary | ICD-10-CM | POA: Diagnosis not present

## 2018-09-10 DIAGNOSIS — N184 Chronic kidney disease, stage 4 (severe): Secondary | ICD-10-CM | POA: Diagnosis not present

## 2018-09-10 DIAGNOSIS — I10 Essential (primary) hypertension: Secondary | ICD-10-CM | POA: Diagnosis not present

## 2018-11-12 DIAGNOSIS — I340 Nonrheumatic mitral (valve) insufficiency: Secondary | ICD-10-CM | POA: Diagnosis not present

## 2018-11-12 DIAGNOSIS — I420 Dilated cardiomyopathy: Secondary | ICD-10-CM | POA: Diagnosis not present

## 2018-11-12 DIAGNOSIS — I425 Other restrictive cardiomyopathy: Secondary | ICD-10-CM | POA: Diagnosis not present

## 2018-11-12 DIAGNOSIS — I361 Nonrheumatic tricuspid (valve) insufficiency: Secondary | ICD-10-CM | POA: Diagnosis not present

## 2018-12-16 DIAGNOSIS — H10413 Chronic giant papillary conjunctivitis, bilateral: Secondary | ICD-10-CM | POA: Diagnosis not present

## 2018-12-16 DIAGNOSIS — H04123 Dry eye syndrome of bilateral lacrimal glands: Secondary | ICD-10-CM | POA: Diagnosis not present

## 2018-12-16 DIAGNOSIS — H2513 Age-related nuclear cataract, bilateral: Secondary | ICD-10-CM | POA: Diagnosis not present

## 2018-12-18 DIAGNOSIS — E559 Vitamin D deficiency, unspecified: Secondary | ICD-10-CM | POA: Diagnosis not present

## 2018-12-18 DIAGNOSIS — N390 Urinary tract infection, site not specified: Secondary | ICD-10-CM | POA: Diagnosis not present

## 2018-12-18 DIAGNOSIS — E039 Hypothyroidism, unspecified: Secondary | ICD-10-CM | POA: Diagnosis not present

## 2018-12-18 DIAGNOSIS — E7800 Pure hypercholesterolemia, unspecified: Secondary | ICD-10-CM | POA: Diagnosis not present

## 2018-12-18 DIAGNOSIS — I10 Essential (primary) hypertension: Secondary | ICD-10-CM | POA: Diagnosis not present

## 2018-12-23 DIAGNOSIS — E039 Hypothyroidism, unspecified: Secondary | ICD-10-CM | POA: Diagnosis not present

## 2018-12-23 DIAGNOSIS — N390 Urinary tract infection, site not specified: Secondary | ICD-10-CM | POA: Diagnosis not present

## 2018-12-23 DIAGNOSIS — Z23 Encounter for immunization: Secondary | ICD-10-CM | POA: Diagnosis not present

## 2018-12-23 DIAGNOSIS — I10 Essential (primary) hypertension: Secondary | ICD-10-CM | POA: Diagnosis not present

## 2018-12-23 DIAGNOSIS — N183 Chronic kidney disease, stage 3 unspecified: Secondary | ICD-10-CM | POA: Diagnosis not present

## 2018-12-23 DIAGNOSIS — Z0000 Encounter for general adult medical examination without abnormal findings: Secondary | ICD-10-CM | POA: Diagnosis not present

## 2018-12-23 DIAGNOSIS — K219 Gastro-esophageal reflux disease without esophagitis: Secondary | ICD-10-CM | POA: Diagnosis not present

## 2018-12-23 DIAGNOSIS — J45909 Unspecified asthma, uncomplicated: Secondary | ICD-10-CM | POA: Diagnosis not present

## 2019-03-16 DIAGNOSIS — N184 Chronic kidney disease, stage 4 (severe): Secondary | ICD-10-CM | POA: Diagnosis not present

## 2019-03-16 DIAGNOSIS — I10 Essential (primary) hypertension: Secondary | ICD-10-CM | POA: Diagnosis not present

## 2019-03-16 DIAGNOSIS — Z86711 Personal history of pulmonary embolism: Secondary | ICD-10-CM | POA: Diagnosis not present

## 2019-03-16 DIAGNOSIS — R072 Precordial pain: Secondary | ICD-10-CM | POA: Diagnosis not present

## 2019-03-16 DIAGNOSIS — R0602 Shortness of breath: Secondary | ICD-10-CM | POA: Diagnosis not present

## 2019-04-16 DIAGNOSIS — R0602 Shortness of breath: Secondary | ICD-10-CM | POA: Diagnosis not present

## 2019-04-16 DIAGNOSIS — N182 Chronic kidney disease, stage 2 (mild): Secondary | ICD-10-CM | POA: Diagnosis not present

## 2019-04-16 DIAGNOSIS — I10 Essential (primary) hypertension: Secondary | ICD-10-CM | POA: Diagnosis not present

## 2019-06-08 DIAGNOSIS — Z1231 Encounter for screening mammogram for malignant neoplasm of breast: Secondary | ICD-10-CM | POA: Diagnosis not present

## 2019-08-13 DIAGNOSIS — J42 Unspecified chronic bronchitis: Secondary | ICD-10-CM | POA: Diagnosis not present

## 2019-08-13 DIAGNOSIS — I10 Essential (primary) hypertension: Secondary | ICD-10-CM | POA: Diagnosis not present

## 2019-08-13 DIAGNOSIS — R0602 Shortness of breath: Secondary | ICD-10-CM | POA: Diagnosis not present

## 2019-08-13 DIAGNOSIS — N182 Chronic kidney disease, stage 2 (mild): Secondary | ICD-10-CM | POA: Diagnosis not present

## 2019-11-13 DIAGNOSIS — I361 Nonrheumatic tricuspid (valve) insufficiency: Secondary | ICD-10-CM | POA: Diagnosis not present

## 2019-11-13 DIAGNOSIS — I340 Nonrheumatic mitral (valve) insufficiency: Secondary | ICD-10-CM | POA: Diagnosis not present

## 2019-11-13 DIAGNOSIS — I5022 Chronic systolic (congestive) heart failure: Secondary | ICD-10-CM | POA: Diagnosis not present

## 2019-11-13 DIAGNOSIS — I2510 Atherosclerotic heart disease of native coronary artery without angina pectoris: Secondary | ICD-10-CM | POA: Diagnosis not present

## 2019-12-17 DIAGNOSIS — H04123 Dry eye syndrome of bilateral lacrimal glands: Secondary | ICD-10-CM | POA: Diagnosis not present

## 2019-12-17 DIAGNOSIS — H2513 Age-related nuclear cataract, bilateral: Secondary | ICD-10-CM | POA: Diagnosis not present

## 2019-12-17 DIAGNOSIS — H10413 Chronic giant papillary conjunctivitis, bilateral: Secondary | ICD-10-CM | POA: Diagnosis not present

## 2019-12-24 DIAGNOSIS — E559 Vitamin D deficiency, unspecified: Secondary | ICD-10-CM | POA: Diagnosis not present

## 2019-12-24 DIAGNOSIS — N390 Urinary tract infection, site not specified: Secondary | ICD-10-CM | POA: Diagnosis not present

## 2019-12-24 DIAGNOSIS — E7800 Pure hypercholesterolemia, unspecified: Secondary | ICD-10-CM | POA: Diagnosis not present

## 2019-12-24 DIAGNOSIS — I10 Essential (primary) hypertension: Secondary | ICD-10-CM | POA: Diagnosis not present

## 2019-12-24 DIAGNOSIS — Z0000 Encounter for general adult medical examination without abnormal findings: Secondary | ICD-10-CM | POA: Diagnosis not present

## 2019-12-24 DIAGNOSIS — E039 Hypothyroidism, unspecified: Secondary | ICD-10-CM | POA: Diagnosis not present

## 2019-12-31 DIAGNOSIS — Z0000 Encounter for general adult medical examination without abnormal findings: Secondary | ICD-10-CM | POA: Diagnosis not present

## 2019-12-31 DIAGNOSIS — Z6841 Body Mass Index (BMI) 40.0 and over, adult: Secondary | ICD-10-CM | POA: Diagnosis not present

## 2019-12-31 DIAGNOSIS — N390 Urinary tract infection, site not specified: Secondary | ICD-10-CM | POA: Diagnosis not present

## 2019-12-31 DIAGNOSIS — E039 Hypothyroidism, unspecified: Secondary | ICD-10-CM | POA: Diagnosis not present

## 2019-12-31 DIAGNOSIS — N183 Chronic kidney disease, stage 3 unspecified: Secondary | ICD-10-CM | POA: Diagnosis not present

## 2019-12-31 DIAGNOSIS — I10 Essential (primary) hypertension: Secondary | ICD-10-CM | POA: Diagnosis not present

## 2019-12-31 DIAGNOSIS — E559 Vitamin D deficiency, unspecified: Secondary | ICD-10-CM | POA: Diagnosis not present

## 2019-12-31 DIAGNOSIS — K219 Gastro-esophageal reflux disease without esophagitis: Secondary | ICD-10-CM | POA: Diagnosis not present

## 2020-01-21 DIAGNOSIS — H52223 Regular astigmatism, bilateral: Secondary | ICD-10-CM | POA: Diagnosis not present

## 2020-01-21 DIAGNOSIS — H524 Presbyopia: Secondary | ICD-10-CM | POA: Diagnosis not present

## 2020-02-18 DIAGNOSIS — N1832 Chronic kidney disease, stage 3b: Secondary | ICD-10-CM | POA: Diagnosis not present

## 2020-02-18 DIAGNOSIS — R600 Localized edema: Secondary | ICD-10-CM | POA: Diagnosis not present

## 2020-02-18 DIAGNOSIS — R749 Abnormal serum enzyme level, unspecified: Secondary | ICD-10-CM | POA: Diagnosis not present

## 2020-02-18 DIAGNOSIS — N390 Urinary tract infection, site not specified: Secondary | ICD-10-CM | POA: Diagnosis not present

## 2020-02-18 DIAGNOSIS — N393 Stress incontinence (female) (male): Secondary | ICD-10-CM | POA: Diagnosis not present

## 2020-03-07 DIAGNOSIS — I10 Essential (primary) hypertension: Secondary | ICD-10-CM | POA: Diagnosis not present

## 2020-03-07 DIAGNOSIS — N1832 Chronic kidney disease, stage 3b: Secondary | ICD-10-CM | POA: Diagnosis not present

## 2020-03-07 DIAGNOSIS — E7800 Pure hypercholesterolemia, unspecified: Secondary | ICD-10-CM | POA: Diagnosis not present

## 2020-03-07 DIAGNOSIS — N393 Stress incontinence (female) (male): Secondary | ICD-10-CM | POA: Diagnosis not present

## 2020-04-25 DIAGNOSIS — I2510 Atherosclerotic heart disease of native coronary artery without angina pectoris: Secondary | ICD-10-CM | POA: Diagnosis not present

## 2020-04-25 DIAGNOSIS — I10 Essential (primary) hypertension: Secondary | ICD-10-CM | POA: Diagnosis not present

## 2020-04-25 DIAGNOSIS — N182 Chronic kidney disease, stage 2 (mild): Secondary | ICD-10-CM | POA: Diagnosis not present

## 2020-04-25 DIAGNOSIS — R0602 Shortness of breath: Secondary | ICD-10-CM | POA: Diagnosis not present

## 2020-05-27 DIAGNOSIS — N201 Calculus of ureter: Secondary | ICD-10-CM | POA: Diagnosis not present

## 2020-05-27 DIAGNOSIS — R350 Frequency of micturition: Secondary | ICD-10-CM | POA: Diagnosis not present

## 2020-05-27 DIAGNOSIS — R351 Nocturia: Secondary | ICD-10-CM | POA: Diagnosis not present

## 2020-05-27 DIAGNOSIS — N3946 Mixed incontinence: Secondary | ICD-10-CM | POA: Diagnosis not present

## 2020-06-10 DIAGNOSIS — N201 Calculus of ureter: Secondary | ICD-10-CM | POA: Diagnosis not present

## 2020-06-23 ENCOUNTER — Encounter: Payer: Self-pay | Admitting: Physician Assistant

## 2020-06-23 ENCOUNTER — Ambulatory Visit (INDEPENDENT_AMBULATORY_CARE_PROVIDER_SITE_OTHER): Payer: Medicare HMO

## 2020-06-23 ENCOUNTER — Ambulatory Visit: Payer: Commercial Managed Care - HMO | Admitting: Physician Assistant

## 2020-06-23 VITALS — Ht 66.0 in | Wt 281.4 lb

## 2020-06-23 DIAGNOSIS — M1712 Unilateral primary osteoarthritis, left knee: Secondary | ICD-10-CM

## 2020-06-23 MED ORDER — METHYLPREDNISOLONE ACETATE 40 MG/ML IJ SUSP
40.0000 mg | INTRAMUSCULAR | Status: AC | PRN
Start: 2020-06-23 — End: 2020-06-23
  Administered 2020-06-23: 40 mg via INTRA_ARTICULAR

## 2020-06-23 MED ORDER — LIDOCAINE HCL 1 % IJ SOLN
3.0000 mL | INTRAMUSCULAR | Status: AC | PRN
  Administered 2020-06-23: 3 mL

## 2020-06-23 NOTE — Progress Notes (Signed)
Office Visit Note   Patient: Shannon Hammond           Date of Birth: Dec 16, 1945           MRN: [id number] Visit Date: 06/23/2020              Requested by: Deland Pretty, MD [address] [place],  [place] [address] PCP: Deland Pretty, MD   Assessment & Plan: Visit Diagnoses:  1. Primary osteoarthritis of left knee     Plan: She understands to wait at least 3 months between injections.  Follow-up with [place] as needed.  Quad strengthening exercises were reviewed with the patient.  Follow-Up Instructions: No follow-ups on file.   Orders:  Orders Placed This Encounter  Procedures  . Large Joint Inj  . XR Knee 1-2 Views Left   No orders of the defined types were placed in this encounter.     Procedures: Large Joint Inj on 06/23/2020 4:22 PM Indications: pain Details: 22 G 1.5 in needle, anterolateral approach  Arthrogram: No  Medications: 3 mL lidocaine 1 %; 40 mg methylPREDNISolone acetate 40 MG/ML Outcome: tolerated well, no immediate complications Procedure, treatment alternatives, risks and benefits explained, specific risks discussed. Consent was given by the patient. Immediately prior to procedure a time out was called to verify the correct patient, procedure, equipment, support staff and site/side marked as required. Patient was prepped and draped in the usual sterile fashion.       Clinical Data: No additional findings.   Subjective: Chief Complaint  Patient presents with  . Left Knee - Pain    HPI Shannon Hammond returns today after not being seen in our office since 2018.  She is having left knee pain.  She has known osteoarthritis both knees.  She is on Eliquis due to the fact that she has had DVTs and PEs on 2 separate occasions.  Now on chronic anticoagulation.  She has had no new injury to either knee.  Left knee is bothering her significantly at this point in giving way on her.  She is wearing a knee sleeve that helps some.  She has tried  supplemental injections in the past which really have not helped.  She is nondiabetic. Review of Systems Denies any fevers or chills.  Objective: Vital Signs: Ht '5\' 6"'$  (1.676 m)   Wt 281 lb 6.4 oz (127.6 kg)   BMI 45.42 kg/m   Physical Exam Constitutional:      Appearance: She is not ill-appearing or diaphoretic.  Neurological:     Mental Status: She is alert and oriented to person, place, and time.  Psychiatric:        Mood and Affect: Mood normal.     Ortho Exam Left knee good range of motion.  Tenderness along medial joint line.  No instability valgus varus stressing no abnormal warmth erythema or effusion.  Positive crepitus with passive range of motion. Specialty Comments:  No specialty comments available.  Imaging: XR Knee 1-2 Views Left  Result Date: 06/23/2020 AP and lateral Left knee : No acute fracture . Bone on bone medial joint line. Severe patella femoral arthritis . Knee well located.                  PMFS History: Patient Active Problem List   Diagnosis Date Noted  . RVF (right ventricular failure) ([place]) 03/02/2016  . Morbid obesity ([place]) 03/02/2016  . Right leg DVT ([place]) 03/02/2016  . CKD (chronic kidney disease)  stage 4, GFR 15-29 ml/min (HCC) 03/02/2016  . Subacute massive pulmonary embolism ([place]) 02/29/2016  . Paroxysmal A-fib ([place]) 02/29/2016  . Chest pain 11/03/2014  . Dyspnea 11/02/2014   Past Medical History:  Diagnosis Date  . Anginal pain ([place])   . Arthritis    BACK AND KNEES  . DVT (deep venous thrombosis) ([place]) 2012   RIGHT LEG  . GERD (gastroesophageal reflux disease)   . Heart murmur   . Hemorrhoids   . History of kidney stones   . History of nocturia   . Hypothyroidism   . PE (pulmonary embolism)    HX PE 2012 - TX'D IN [place]- HOSPITALIZED X 1 WEEK.   NO LONGER ON BLOOD THINNER OTHER THAN 81 MG ASPIRIN  . Seizures ([place]) 2007   ONLY ONCE - THOUGHT TO BE STRESS INDUCED - PT WAS DEALING WITH HER MOTHER'S DEATH  . Shortness  of breath    PT TOLD BY HER MEDICAL DOCTOR - SOME LUNG CHANGES DUE TO 2ND HAND SMOKE - SHE WAS GIVEN INHALER TO USE AS NEEDED.    Family History  Problem Relation Age of Onset  . Heart attack Father   . Clotting disorder Neg Hx     Past Surgical History:  Procedure Laterality Date  . BREAST SURGERY  1993   CYST REMOVED LEFT BREAST  . CHOLECYSTECTOMY  LATE 1990'S  . CYSTOSCOPY WITH RETROGRADE PYELOGRAM, URETEROSCOPY AND STENT PLACEMENT Right 11/27/2012   Procedure: CYSTOSCOPY WITH RETROGRADE PYELOGRAM, URETEROSCOPY, stone basketry AND STENT PLACEMENT;  Surgeon: Franchot Gallo, MD;  [facility];  Service: Urology;  Laterality: Right;  . HOLMIUM LASER APPLICATION Right [id number]   Procedure: HOLMIUM LASER APPLICATION;  Surgeon: Franchot Gallo, MD;  [facility];  Service: Urology;  Laterality: Right;  . LITHOTRIPSY     BOTH KIDNEYS   Social History   Occupational History  . Not on file  Tobacco Use  . Smoking status: Never Smoker  . Smokeless tobacco: Never Used  Substance and Sexual Activity  . Alcohol use: No  . Drug use: No  . Sexual activity: Not on file

## 2020-06-28 DIAGNOSIS — N3946 Mixed incontinence: Secondary | ICD-10-CM | POA: Diagnosis not present

## 2020-06-28 DIAGNOSIS — R351 Nocturia: Secondary | ICD-10-CM | POA: Diagnosis not present

## 2020-07-14 DIAGNOSIS — N3944 Nocturnal enuresis: Secondary | ICD-10-CM | POA: Diagnosis not present

## 2020-07-14 DIAGNOSIS — N3946 Mixed incontinence: Secondary | ICD-10-CM | POA: Diagnosis not present

## 2020-07-16 DIAGNOSIS — E7800 Pure hypercholesterolemia, unspecified: Secondary | ICD-10-CM | POA: Diagnosis not present

## 2020-07-16 DIAGNOSIS — N1832 Chronic kidney disease, stage 3b: Secondary | ICD-10-CM | POA: Diagnosis not present

## 2020-07-16 DIAGNOSIS — I519 Heart disease, unspecified: Secondary | ICD-10-CM | POA: Diagnosis not present

## 2020-07-16 DIAGNOSIS — I130 Hypertensive heart and chronic kidney disease with heart failure and stage 1 through stage 4 chronic kidney disease, or unspecified chronic kidney disease: Secondary | ICD-10-CM | POA: Diagnosis not present

## 2020-09-07 DIAGNOSIS — N3946 Mixed incontinence: Secondary | ICD-10-CM | POA: Diagnosis not present

## 2020-09-15 DIAGNOSIS — E7800 Pure hypercholesterolemia, unspecified: Secondary | ICD-10-CM | POA: Diagnosis not present

## 2020-09-15 DIAGNOSIS — I519 Heart disease, unspecified: Secondary | ICD-10-CM | POA: Diagnosis not present

## 2020-09-15 DIAGNOSIS — I130 Hypertensive heart and chronic kidney disease with heart failure and stage 1 through stage 4 chronic kidney disease, or unspecified chronic kidney disease: Secondary | ICD-10-CM | POA: Diagnosis not present

## 2020-09-15 DIAGNOSIS — N1832 Chronic kidney disease, stage 3b: Secondary | ICD-10-CM | POA: Diagnosis not present

## 2020-10-16 DIAGNOSIS — N1832 Chronic kidney disease, stage 3b: Secondary | ICD-10-CM | POA: Diagnosis not present

## 2020-10-16 DIAGNOSIS — I130 Hypertensive heart and chronic kidney disease with heart failure and stage 1 through stage 4 chronic kidney disease, or unspecified chronic kidney disease: Secondary | ICD-10-CM | POA: Diagnosis not present

## 2020-10-16 DIAGNOSIS — I519 Heart disease, unspecified: Secondary | ICD-10-CM | POA: Diagnosis not present

## 2020-10-16 DIAGNOSIS — E7800 Pure hypercholesterolemia, unspecified: Secondary | ICD-10-CM | POA: Diagnosis not present

## 2020-10-27 ENCOUNTER — Inpatient Hospital Stay (HOSPITAL_COMMUNITY)
Admission: EM | Admit: 2020-10-27 | Discharge: 2020-10-30 | DRG: 392 | Disposition: A | Discharge status: Home / Self Care | Payer: Medicare HMO | Attending: Internal Medicine | Admitting: Internal Medicine

## 2020-10-27 ENCOUNTER — Emergency Department (HOSPITAL_COMMUNITY): Payer: Medicare HMO

## 2020-10-27 ENCOUNTER — Encounter (HOSPITAL_COMMUNITY): Payer: Self-pay

## 2020-10-27 ENCOUNTER — Other Ambulatory Visit: Payer: Self-pay

## 2020-10-27 DIAGNOSIS — I129 Hypertensive chronic kidney disease with stage 1 through stage 4 chronic kidney disease, or unspecified chronic kidney disease: Secondary | ICD-10-CM | POA: Diagnosis present

## 2020-10-27 DIAGNOSIS — E039 Hypothyroidism, unspecified: Secondary | ICD-10-CM | POA: Diagnosis not present

## 2020-10-27 DIAGNOSIS — Z79899 Other long term (current) drug therapy: Secondary | ICD-10-CM

## 2020-10-27 DIAGNOSIS — E669 Obesity, unspecified: Secondary | ICD-10-CM | POA: Diagnosis not present

## 2020-10-27 DIAGNOSIS — N179 Acute kidney failure, unspecified: Secondary | ICD-10-CM | POA: Diagnosis not present

## 2020-10-27 DIAGNOSIS — Z6841 Body Mass Index (BMI) 40.0 and over, adult: Secondary | ICD-10-CM | POA: Diagnosis not present

## 2020-10-27 DIAGNOSIS — K219 Gastro-esophageal reflux disease without esophagitis: Secondary | ICD-10-CM | POA: Diagnosis present

## 2020-10-27 DIAGNOSIS — N184 Chronic kidney disease, stage 4 (severe): Secondary | ICD-10-CM | POA: Diagnosis not present

## 2020-10-27 DIAGNOSIS — Z79890 Hormone replacement therapy (postmenopausal): Secondary | ICD-10-CM

## 2020-10-27 DIAGNOSIS — J449 Chronic obstructive pulmonary disease, unspecified: Secondary | ICD-10-CM | POA: Diagnosis not present

## 2020-10-27 DIAGNOSIS — Z86711 Personal history of pulmonary embolism: Secondary | ICD-10-CM

## 2020-10-27 DIAGNOSIS — Z86718 Personal history of other venous thrombosis and embolism: Secondary | ICD-10-CM

## 2020-10-27 DIAGNOSIS — I480 Paroxysmal atrial fibrillation: Secondary | ICD-10-CM | POA: Diagnosis not present

## 2020-10-27 DIAGNOSIS — K529 Noninfective gastroenteritis and colitis, unspecified: Secondary | ICD-10-CM | POA: Diagnosis not present

## 2020-10-27 DIAGNOSIS — R197 Diarrhea, unspecified: Secondary | ICD-10-CM | POA: Diagnosis not present

## 2020-10-27 DIAGNOSIS — Z7722 Contact with and (suspected) exposure to environmental tobacco smoke (acute) (chronic): Secondary | ICD-10-CM | POA: Diagnosis present

## 2020-10-27 DIAGNOSIS — K567 Ileus, unspecified: Secondary | ICD-10-CM | POA: Diagnosis present

## 2020-10-27 DIAGNOSIS — I10 Essential (primary) hypertension: Secondary | ICD-10-CM | POA: Diagnosis not present

## 2020-10-27 DIAGNOSIS — R1013 Epigastric pain: Secondary | ICD-10-CM | POA: Diagnosis not present

## 2020-10-27 DIAGNOSIS — Z8249 Family history of ischemic heart disease and other diseases of the circulatory system: Secondary | ICD-10-CM | POA: Diagnosis not present

## 2020-10-27 DIAGNOSIS — R112 Nausea with vomiting, unspecified: Secondary | ICD-10-CM | POA: Diagnosis not present

## 2020-10-27 DIAGNOSIS — Z7951 Long term (current) use of inhaled steroids: Secondary | ICD-10-CM | POA: Diagnosis not present

## 2020-10-27 DIAGNOSIS — Z888 Allergy status to other drugs, medicaments and biological substances status: Secondary | ICD-10-CM | POA: Diagnosis not present

## 2020-10-27 DIAGNOSIS — I2699 Other pulmonary embolism without acute cor pulmonale: Secondary | ICD-10-CM | POA: Diagnosis not present

## 2020-10-27 DIAGNOSIS — A084 Viral intestinal infection, unspecified: Principal | ICD-10-CM | POA: Diagnosis present

## 2020-10-27 DIAGNOSIS — R109 Unspecified abdominal pain: Secondary | ICD-10-CM | POA: Diagnosis present

## 2020-10-27 DIAGNOSIS — Z20822 Contact with and (suspected) exposure to covid-19: Secondary | ICD-10-CM | POA: Diagnosis not present

## 2020-10-27 DIAGNOSIS — Z7901 Long term (current) use of anticoagulants: Secondary | ICD-10-CM

## 2020-10-27 DIAGNOSIS — R1084 Generalized abdominal pain: Secondary | ICD-10-CM | POA: Diagnosis not present

## 2020-10-27 HISTORY — DX: Essential (primary) hypertension: I10

## 2020-10-27 LAB — CBC
HCT: 44.2 % (ref 36.0–46.0)
Hemoglobin: 14.5 g/dL (ref 12.0–15.0)
MCH: 29.4 pg (ref 26.0–34.0)
MCHC: 32.8 g/dL (ref 30.0–36.0)
MCV: 89.7 fL (ref 80.0–100.0)
Platelets: 277 10*3/uL (ref 150–400)
RBC: 4.93 MIL/uL (ref 3.87–5.11)
RDW: 15.8 % — ABNORMAL HIGH (ref 11.5–15.5)
WBC: 9.1 10*3/uL (ref 4.0–10.5)
nRBC: 0 % (ref 0.0–0.2)

## 2020-10-27 LAB — COMPREHENSIVE METABOLIC PANEL
ALT: 41 U/L (ref 0–44)
AST: 35 U/L (ref 15–41)
Albumin: 3.7 g/dL (ref 3.5–5.0)
Alkaline Phosphatase: 173 U/L — ABNORMAL HIGH (ref 38–126)
Anion gap: 8 (ref 5–15)
BUN: 16 mg/dL (ref 8–23)
CO2: 24 mmol/L (ref 22–32)
Calcium: 9.4 mg/dL (ref 8.9–10.3)
Chloride: 109 mmol/L (ref 98–111)
Creatinine, Ser: 1.31 mg/dL — ABNORMAL HIGH (ref 0.44–1.00)
GFR, Estimated: 42 mL/min — ABNORMAL LOW (ref >60)
Glucose, Bld: 113 mg/dL — ABNORMAL HIGH (ref 70–99)
Potassium: 3.8 mmol/L (ref 3.5–5.1)
Sodium: 141 mmol/L (ref 135–145)
Total Bilirubin: 0.7 mg/dL (ref 0.3–1.2)
Total Protein: 7.3 g/dL (ref 6.5–8.1)

## 2020-10-27 LAB — URINALYSIS, ROUTINE W REFLEX MICROSCOPIC
Bilirubin Urine: NEGATIVE
Glucose, UA: NEGATIVE mg/dL
Hgb urine dipstick: NEGATIVE
Ketones, ur: NEGATIVE mg/dL
Leukocytes,Ua: NEGATIVE
Nitrite: NEGATIVE
Protein, ur: NEGATIVE mg/dL
Specific Gravity, Urine: >1.046 — ABNORMAL HIGH (ref 1.005–1.030)
pH: 5 (ref 5.0–8.0)

## 2020-10-27 LAB — RESP PANEL BY RT-PCR (FLU A&B, COVID) ARPGX2
Influenza A by PCR: NEGATIVE
Influenza B by PCR: NEGATIVE
SARS Coronavirus 2 by RT PCR: NEGATIVE

## 2020-10-27 LAB — LIPASE, BLOOD: Lipase: 24 U/L (ref 11–51)

## 2020-10-27 LAB — HEPARIN LEVEL (UNFRACTIONATED): Heparin Unfractionated: >1.1 IU/mL — ABNORMAL HIGH (ref 0.30–0.70)

## 2020-10-27 LAB — APTT: aPTT: 29 seconds (ref 24–36)

## 2020-10-27 MED ORDER — ONDANSETRON HCL 4 MG/2ML IJ SOLN
4.0000 mg | Freq: Four times a day (QID) | INTRAMUSCULAR | Status: DC | PRN
  Administered 2020-10-29: 4 mg via INTRAVENOUS
  Filled 2020-10-27: qty 2

## 2020-10-27 MED ORDER — POTASSIUM CHLORIDE IN NACL 20-0.45 MEQ/L-% IV SOLN
INTRAVENOUS | Status: DC
  Filled 2020-10-27 (×2): qty 1000

## 2020-10-27 MED ORDER — LIDOCAINE VISCOUS HCL 2 % MT SOLN
15.0000 mL | Freq: Once | OROMUCOSAL | Status: AC
  Administered 2020-10-27: 15 mL via ORAL
  Filled 2020-10-27: qty 15

## 2020-10-27 MED ORDER — MORPHINE SULFATE (PF) 4 MG/ML IV SOLN
4.0000 mg | Freq: Once | INTRAVENOUS | Status: AC
  Administered 2020-10-27: 4 mg via INTRAVENOUS
  Filled 2020-10-27: qty 1

## 2020-10-27 MED ORDER — HEPARIN (PORCINE) 25000 UT/250ML-% IV SOLN
1700.0000 [IU]/h | INTRAVENOUS | Status: DC
  Administered 2020-10-27: 1500 [IU]/h via INTRAVENOUS
  Administered 2020-10-28 – 2020-10-29 (×2): 1700 [IU]/h via INTRAVENOUS
  Filled 2020-10-27 (×3): qty 250

## 2020-10-27 MED ORDER — MONTELUKAST SODIUM 10 MG PO TABS
10.0000 mg | ORAL_TABLET | Freq: Every day | ORAL | Status: DC
  Administered 2020-10-27 – 2020-10-29 (×3): 10 mg via ORAL
  Filled 2020-10-27 (×3): qty 1

## 2020-10-27 MED ORDER — DILTIAZEM HCL ER COATED BEADS 120 MG PO CP24
120.0000 mg | ORAL_CAPSULE | Freq: Two times a day (BID) | ORAL | Status: DC
  Administered 2020-10-27 – 2020-10-30 (×6): 120 mg via ORAL
  Filled 2020-10-27 (×7): qty 1

## 2020-10-27 MED ORDER — LEVOTHYROXINE SODIUM 88 MCG PO TABS: 88.0000 ug | ORAL_TABLET | Freq: Every day | ORAL | Status: DC

## 2020-10-27 MED ORDER — ALBUTEROL SULFATE HFA 108 (90 BASE) MCG/ACT IN AERS: 2.0000 | INHALATION_SPRAY | RESPIRATORY_TRACT | Status: DC | PRN

## 2020-10-27 MED ORDER — ALUM & MAG HYDROXIDE-SIMETH 200-200-20 MG/5ML PO SUSP
30.0000 mL | Freq: Once | ORAL | Status: AC
Start: 2020-10-27 — End: 2020-10-27
  Administered 2020-10-27: 30 mL via ORAL
  Filled 2020-10-27: qty 30

## 2020-10-27 MED ORDER — MORPHINE SULFATE (PF) 2 MG/ML IV SOLN
2.0000 mg | INTRAVENOUS | Status: DC | PRN
Start: 2020-10-27 — End: 2020-10-30

## 2020-10-27 MED ORDER — ACETAMINOPHEN 650 MG RE SUPP: 650.0000 mg | Freq: Four times a day (QID) | RECTAL | Status: DC | PRN

## 2020-10-27 MED ORDER — SODIUM CHLORIDE 0.9 % IV BOLUS
500.0000 mL | Freq: Once | INTRAVENOUS | Status: AC
  Administered 2020-10-27: 500 mL via INTRAVENOUS

## 2020-10-27 MED ORDER — MOMETASONE FURO-FORMOTEROL FUM 200-5 MCG/ACT IN AERO
2.0000 | INHALATION_SPRAY | Freq: Two times a day (BID) | RESPIRATORY_TRACT | Status: DC
  Administered 2020-10-27 – 2020-10-30 (×6): 2 via RESPIRATORY_TRACT
  Filled 2020-10-27: qty 8.8

## 2020-10-27 MED ORDER — ONDANSETRON HCL 4 MG/2ML IJ SOLN
4.0000 mg | Freq: Once | INTRAMUSCULAR | Status: AC
  Administered 2020-10-27: 4 mg via INTRAVENOUS
  Filled 2020-10-27: qty 2

## 2020-10-27 MED ORDER — ALBUTEROL SULFATE (2.5 MG/3ML) 0.083% IN NEBU: 2.5000 mg | INHALATION_SOLUTION | RESPIRATORY_TRACT | Status: DC | PRN

## 2020-10-27 MED ORDER — ONDANSETRON HCL 4 MG PO TABS
4.0000 mg | ORAL_TABLET | Freq: Four times a day (QID) | ORAL | Status: DC | PRN
Start: 2020-10-27 — End: 2020-10-29
  Administered 2020-10-27: 4 mg via ORAL
  Filled 2020-10-27: qty 1

## 2020-10-27 MED ORDER — ACETAMINOPHEN 325 MG PO TABS: 650.0000 mg | ORAL_TABLET | Freq: Four times a day (QID) | ORAL | Status: DC | PRN

## 2020-10-27 MED ORDER — LEVOTHYROXINE SODIUM 88 MCG PO TABS
88.0000 ug | ORAL_TABLET | Freq: Every day | ORAL | Status: DC
  Administered 2020-10-28 – 2020-10-30 (×3): 88 ug via ORAL
  Filled 2020-10-27 (×3): qty 1

## 2020-10-27 MED ORDER — METOPROLOL TARTRATE 25 MG PO TABS: 12.5000 mg | ORAL_TABLET | Freq: Two times a day (BID) | ORAL | Status: DC

## 2020-10-27 MED ORDER — IOHEXOL 350 MG/ML SOLN
100.0000 mL | Freq: Once | INTRAVENOUS | Status: AC | PRN
  Administered 2020-10-27: 60 mL via INTRAVENOUS

## 2020-10-27 MED ORDER — PANTOPRAZOLE SODIUM 40 MG IV SOLR
40.0000 mg | Freq: Two times a day (BID) | INTRAVENOUS | Status: DC
  Administered 2020-10-27 – 2020-10-28 (×2): 40 mg via INTRAVENOUS
  Filled 2020-10-27 (×2): qty 40

## 2020-10-27 MED ORDER — METOPROLOL TARTRATE 12.5 MG HALF TABLET
12.5000 mg | ORAL_TABLET | Freq: Two times a day (BID) | ORAL | Status: DC
  Administered 2020-10-27 – 2020-10-30 (×6): 12.5 mg via ORAL
  Filled 2020-10-27 (×6): qty 1

## 2020-10-27 NOTE — Progress Notes (Signed)
ANTICOAGULATION CONSULT NOTE - Initial Consult  Pharmacy Consult for IV heparin (on apixaban PTA) Indication: DVT  Allergies  Allergen Reactions   Myrbetriq [Mirabegron]     Patient Measurements:   Heparin Dosing Weight: 90 kg  Vital Signs: Temp: 99.4 F (37.4 C) (08/11 1350) Temp Source: Rectal (08/11 1350) BP: 122/64 (08/11 1730) Pulse Rate: 73 (08/11 1730)  Labs: Recent Labs    10/27/20 1224 10/27/20 1754  HGB 14.5  --   HCT 44.2  --   PLT 277  --   APTT  --  29  HEPARINUNFRC  --  >1.10*  CREATININE 1.31*  --     CrCl cannot be calculated (Unknown ideal weight.).   Medical History: Past Medical History:  Diagnosis Date   Anginal pain ([place])    Arthritis    BACK AND KNEES   DVT (deep venous thrombosis) ([facility]) 03/19/2010   RIGHT LEG   GERD (gastroesophageal reflux disease)    Heart murmur    Hemorrhoids    History of kidney stones    History of nocturia    Hypertension    Hypothyroidism    PE (pulmonary embolism)    HX PE 2012 - TX'D IN [facility] [place]- HOSPITALIZED X 1 WEEK.   NO LONGER ON BLOOD THINNER OTHER THAN 81 MG ASPIRIN   Seizures ([place]) 03/19/2005   ONLY ONCE - THOUGHT TO BE STRESS INDUCED - PT WAS DEALING WITH HER MOTHER'S DEATH   Shortness of breath    PT TOLD BY HER MEDICAL DOCTOR - SOME LUNG CHANGES DUE TO 2ND HAND SMOKE - SHE WAS GIVEN INHALER TO USE AS NEEDED.    Medications:  Scheduled:   diltiazem  120 mg Oral BID   [START ON 10/28/2020] levothyroxine  88 mcg Oral QAC breakfast   metoprolol tartrate  12.5 mg Oral BID   mometasone-formoterol  2 puff Inhalation BID   montelukast  10 mg Oral QHS   pantoprazole (PROTONIX) IV  40 mg Intravenous Q12H   Infusions:   0.45 % NaCl with KCl 20 mEq / L     PRN: acetaminophen **OR** acetaminophen, albuterol, morphine injection, ondansetron **OR** ondansetron (ZOFRAN) IV  Assessment: 75 yo female with SBO vs enteritis with surgery managing. Patient on apixaban PTA for hx VTE to transition to  IV heparin for time being as inpatient. Baseline labs obtained. Heparin level supratherapeutic as expected due to apixaban and aPTT WNL as expected. Per [facility] note, last dose of apixaban was this AM  Goal of Therapy:  Heparin level 0.3-0.7 units/ml aPTT 66-102 seconds Monitor platelets by anticoagulation protocol: Yes   Plan:  NO IV heparin bolus Start IV heparin at rate of 1500 units/hr Check aPTT 8 hours after start of IV heparin Daily heparin level and CBC  Kara Mead 10/27/2020,7:45 PM

## 2020-10-27 NOTE — ED Notes (Signed)
Patient transported to CT 

## 2020-10-27 NOTE — Consult Note (Signed)
Consult Note  Shannon Hammond 11/19/1945  [id number].    Requesting MD: Coral Ceo PA-C Chief Complaint/Reason for Consult: SBO vs enteritis  HPI:  Patient is a 75 year old female who presented to [facility] with abdominal pain, diarrhea and n/v. Abdominal pain and diarrhea started Monday and nausea and vomiting in the last 1-2 days. She reports abdominal pain is mostly in upper abdomen and burning and spreads across upper abdomen. She also reports increased flatulence. Emesis has been NBNB. Diarrhea has not been bloody or mucus-like. She denies fever or urinary symptoms. She reports feeling cold more often. Her husband is with her and has been asymptomatic but her daughter has similar symptoms. Past abdominal surgery includes laparoscopic cholecystectomy. PMH otherwise significant for IBS, GERD, Hx of DVT/PE on eliquis (LD this AM), HTN, hypothyroidism.   ROS: Review of Systems  Constitutional:  Positive for chills. Negative for fever.  Respiratory:  Negative for shortness of breath and wheezing.   Cardiovascular:  Negative for chest pain and palpitations.  Gastrointestinal:  Positive for abdominal pain, diarrhea, nausea and vomiting. Negative for blood in stool, constipation and melena.  Genitourinary:  Negative for dysuria, frequency and urgency.  All other systems reviewed and are negative.  Family History  Problem Relation Age of Onset   Heart attack Father    Clotting disorder Neg Hx     Past Medical History:  Diagnosis Date   Anginal pain ([place])    Arthritis    BACK AND KNEES   DVT (deep venous thrombosis) (HCC) 03/19/2010   RIGHT LEG   GERD (gastroesophageal reflux disease)    Heart murmur    Hemorrhoids    History of kidney stones    History of nocturia    Hypertension    Hypothyroidism    PE (pulmonary embolism)    HX PE 2012 - TX'D IN [place]- HOSPITALIZED X 1 WEEK.   NO LONGER ON BLOOD THINNER OTHER THAN 81 MG ASPIRIN   Seizures ([place]) 03/19/2005   ONLY  ONCE - THOUGHT TO BE STRESS INDUCED - PT WAS DEALING WITH HER MOTHER'S DEATH   Shortness of breath    PT TOLD BY HER MEDICAL DOCTOR - SOME LUNG CHANGES DUE TO 2ND HAND SMOKE - SHE WAS GIVEN INHALER TO USE AS NEEDED.    Past Surgical History:  Procedure Laterality Date   BREAST SURGERY  1993   CYST REMOVED LEFT BREAST   CHOLECYSTECTOMY  LATE 1990'S   CYSTOSCOPY WITH RETROGRADE PYELOGRAM, URETEROSCOPY AND STENT PLACEMENT Right 11/27/2012   Procedure: CYSTOSCOPY WITH RETROGRADE PYELOGRAM, URETEROSCOPY, stone basketry AND STENT PLACEMENT;  Surgeon: Franchot Gallo, MD;  Location: [facility];  Service: Urology;  Laterality: Right;   HOLMIUM LASER APPLICATION Right [id number]   Procedure: HOLMIUM LASER APPLICATION;  Surgeon: Franchot Gallo, MD;  Location: [facility];  Service: Urology;  Laterality: Right;   LITHOTRIPSY     BOTH KIDNEYS    Social History:  reports that she has never smoked. She has never used smokeless tobacco. She reports that she does not drink alcohol and does not use drugs.  Allergies:  Allergies  Allergen Reactions   Myrbetriq [Mirabegron]     (Not in a hospital admission)   Blood pressure 127/83, pulse 77, temperature 99.4 F (37.4 C), temperature source Rectal, resp. rate 15, SpO2 95 %. Physical Exam:  General: pleasant, WD, morbidly obese female who is laying in bed in NAD HEENT: head is normocephalic, atraumatic.  Sclera are noninjected.  PERRL.  Ears and nose without any masses or lesions.  Mouth is pink and moist Heart: regular, rate, and rhythm.  Palpable radial and pedal pulses bilaterally Lungs: CTAB, no wheezes, rhonchi, or rales noted.  Respiratory effort nonlabored Abd: soft, NT, mild distention, no guarding or peritonitis  MS: all 4 extremities are symmetrical with no cyanosis, clubbing, or edema. Skin: warm and dry with no masses, lesions, or rashes Neuro: Cranial nerves 2-12 grossly intact, sensation is normal throughout Psych: A&Ox3 with an  appropriate affect.   Results for orders placed or performed during the hospital encounter of 10/27/20 (from the past 48 hour(s))  Lipase, blood     Status: None   Collection Time: 10/27/20 12:24 PM  Result Value Ref Range   Lipase 24 11 - 51 U/L    Comment: Performed at [facility], [place] [address] [place], [place] [address]  Comprehensive metabolic panel     Status: Abnormal   Collection Time: 10/27/20 12:24 PM  Result Value Ref Range   Sodium 141 135 - 145 mmol/L   Potassium 3.8 3.5 - 5.1 mmol/L   Chloride 109 98 - 111 mmol/L   CO2 24 22 - 32 mmol/L   Glucose, Bld 113 (H) 70 - 99 mg/dL    Comment: Glucose reference range applies only to samples taken after fasting for at least 8 hours.   BUN 16 8 - 23 mg/dL   Creatinine, Ser 1.31 (H) 0.44 - 1.00 mg/dL   Calcium 9.4 8.9 - 10.3 mg/dL   Total Protein 7.3 6.5 - 8.1 g/dL   Albumin 3.7 3.5 - 5.0 g/dL   AST 35 15 - 41 U/L   ALT 41 0 - 44 U/L   Alkaline Phosphatase 173 (H) 38 - 126 U/L   Total Bilirubin 0.7 0.3 - 1.2 mg/dL   GFR, Estimated 42 (L) >60 mL/min    Comment: (NOTE) Calculated using the CKD-EPI Creatinine Equation (2021)    Anion gap 8 5 - 15    Comment: Performed at [facility], [place] [address], [place], [place] [address]  CBC     Status: Abnormal   Collection Time: 10/27/20 12:24 PM  Result Value Ref Range   WBC 9.1 4.0 - 10.5 K/uL   RBC 4.93 3.87 - 5.11 MIL/uL   Hemoglobin 14.5 12.0 - 15.0 g/dL   HCT 44.2 36.0 - 46.0 %   MCV 89.7 80.0 - 100.0 fL   MCH 29.4 26.0 - 34.0 pg   MCHC 32.8 30.0 - 36.0 g/dL   RDW 15.8 (H) 11.5 - 15.5 %   Platelets 277 150 - 400 K/uL   nRBC 0.0 0.0 - 0.2 %    Comment: Performed at [facility], [place] [address] [place], [place] [address]  Urinalysis, Routine w reflex microscopic Urine, Clean Catch     Status: Abnormal   Collection Time: 10/27/20  3:43 PM  Result Value Ref Range   Color, Urine YELLOW YELLOW   APPearance HAZY (A)  CLEAR   Specific Gravity, Urine >1.046 (H) 1.005 - 1.030   pH 5.0 5.0 - 8.0   Glucose, UA NEGATIVE NEGATIVE mg/dL   Hgb urine dipstick NEGATIVE NEGATIVE   Bilirubin Urine NEGATIVE NEGATIVE   Ketones, ur NEGATIVE NEGATIVE mg/dL   Protein, ur NEGATIVE NEGATIVE mg/dL   Nitrite NEGATIVE NEGATIVE   Leukocytes,Ua NEGATIVE NEGATIVE    Comment: Performed at [place] [address], [place], [place] [address]  CT ABDOMEN PELVIS W CONTRAST  Result Date: 10/27/2020 CLINICAL DATA:  Acute generalized abdominal pain. EXAM: CT ABDOMEN AND PELVIS WITH CONTRAST TECHNIQUE: Multidetector CT imaging of the abdomen and pelvis was performed using the standard protocol following bolus administration of intravenous contrast. CONTRAST:  63m OMNIPAQUE IOHEXOL 350 MG/ML SOLN COMPARISON:  November 13, 2012. FINDINGS: Lower chest: No acute abnormality. Hepatobiliary: No focal liver abnormality is seen. Status post cholecystectomy. No biliary dilatation. Pancreas: Unremarkable. No pancreatic ductal dilatation or surrounding inflammatory changes. Spleen: Normal in size without focal abnormality. Adrenals/Urinary Tract: Adrenal glands appear normal. Bilateral renal cysts are noted. Nonobstructive right nephrolithiasis is noted. No hydronephrosis or renal obstruction is noted. Urinary bladder is unremarkable. Stomach/Bowel: Mild gastric distention is noted. Wall thickening of the distal stomach is noted which may represent inflammation or peptic ulcer disease. No colonic dilatation is noted. Dilated small bowel loops are noted in the right lower quadrant which may represent ileus, but distal small bowel obstruction cannot be excluded. The appendix is unremarkable. Vascular/Lymphatic: Aortic atherosclerosis. No enlarged abdominal or pelvic lymph nodes. Reproductive: Probable calcified uterine fibroid is noted. No adnexal abnormality is noted. Other: Moderate size fat containing periumbilical hernia is noted. No  ascites is noted. Musculoskeletal: No acute or significant osseous findings. IMPRESSION: Dilated small bowel loops are noted in the right lower quadrant which may represent ileus, but distal small bowel obstruction cannot be excluded. Mild gastric distention is noted with wall thickening of the distal stomach suggesting possible inflammation or peptic ulcer disease. Nonobstructive right nephrolithiasis is noted. Moderate size fat containing periumbilical hernia. Aortic Atherosclerosis (ICD10-I70.0). Electronically Signed   By: JMarijo ConceptionM.D.   On: 10/27/2020 15:18      Assessment/Plan SBO vs Enteritis  - CT today with Dilated small bowel loops are noted in the right lower quadrant which may represent ileus, but distal small bowel obstruction cannot be excluded. Mild gastric distention is noted with wall thickening of the distal stomach suggesting possible inflammation or peptic ulcer disease. - patient is HD stable and abdominal exam is fairly benign, she is still having diarrhea and not vomiting currently - OK to hold off on NGT placement unless n/v is worsening  - recommend checking GI pathogen panel since patient has a family member with similar symptoms as well, COVID test pending - recommend supportive care and AM film  - no indication for acute surgical intervention, we will follow   FEN: ok to have ice chips and sips, IVF per primary  VTE: pt normally takes eliquis - would hold this for now but heparin gtt is fine if needed  ID: no current abx  Hx of DVT/PE on eliquis - LD 8/11 AM HTN Hypothyroidism GERD - BID PPI IBS  KNorm Parcel [facility] 10/27/2020, 4:18 PM Please see Amion for pager number during day hours 7:00am-4:30pm

## 2020-10-27 NOTE — ED Triage Notes (Signed)
Pt c/o N/V/D since last Sunday 8/7. Pt c/o abdominal pain all across her abdomen starting last night. Pt has hx of HTN, blood clots.

## 2020-10-27 NOTE — ED Provider Notes (Signed)
[facility] DEPT Provider Note   CSN: [id number] Arrival date & time: 10/27/20  1145     History Chief Complaint  Patient presents with   Abdominal Pain   Emesis   Diarrhea    Shannon Hammond is a 75 y.o. female.  HPI  75 year old female with a history of anginal pain, arthritis, VTE, GERD, heart murmur, hemorrhoids, kidney stones, hypertension, seizures, who presents to the emergency department today for evaluation of abdominal pain that started 1 week ago.  She has had some associated diarrhea for the last week as well and he started having nausea and vomiting yesterday.  There have been no fevers or urinary complaints.  Patient has constant pain that is severe in nature.  Past Medical History:  Diagnosis Date   Anginal pain ([place])    Arthritis    BACK AND KNEES   DVT (deep venous thrombosis) ([facility]) 03/19/2010   RIGHT LEG   GERD (gastroesophageal reflux disease)    Heart murmur    Hemorrhoids    History of kidney stones    History of nocturia    Hypertension    Hypothyroidism    PE (pulmonary embolism)    HX PE 2012 - TX'D IN [place]- HOSPITALIZED X 1 WEEK.   NO LONGER ON BLOOD THINNER OTHER THAN 81 MG ASPIRIN   Seizures ([place]) 03/19/2005   ONLY ONCE - THOUGHT TO BE STRESS INDUCED - PT WAS DEALING WITH HER MOTHER'S DEATH   Shortness of breath    PT TOLD BY HER MEDICAL DOCTOR - SOME LUNG CHANGES DUE TO 2ND HAND SMOKE - SHE WAS GIVEN INHALER TO USE AS NEEDED.    Patient Active Problem List   Diagnosis Date Noted   Abdominal pain 10/27/2020   RVF (right ventricular failure) ([place]) 03/02/2016   Morbid obesity ([place]) 03/02/2016   Right leg DVT ([place]) 03/02/2016   CKD (chronic kidney disease) stage 4, GFR 15-29 ml/min ([facility]) 03/02/2016   Subacute massive pulmonary embolism ([place]) 02/29/2016   Paroxysmal A-fib ([place]) 02/29/2016   Chest pain 11/03/2014   Dyspnea 11/02/2014    Past Surgical History:  Procedure Laterality Date   BREAST  SURGERY  1993   CYST REMOVED LEFT BREAST   CHOLECYSTECTOMY  LATE 1990'S   CYSTOSCOPY WITH RETROGRADE PYELOGRAM, URETEROSCOPY AND STENT PLACEMENT Right 11/27/2012   Procedure: CYSTOSCOPY WITH RETROGRADE PYELOGRAM, URETEROSCOPY, stone basketry AND STENT PLACEMENT;  Surgeon: Franchot Gallo, MD;  Location: [facility];  Service: Urology;  Laterality: Right;   HOLMIUM LASER APPLICATION Right [id number]   Procedure: HOLMIUM LASER APPLICATION;  Surgeon: Franchot Gallo, MD;  Location: [facility];  Service: Urology;  Laterality: Right;   LITHOTRIPSY     BOTH KIDNEYS     OB History   No obstetric history on file.     Family History  Problem Relation Age of Onset   Heart attack Father    Clotting disorder Neg Hx     Social History   Tobacco Use   Smoking status: Never   Smokeless tobacco: Never  Substance Use Topics   Alcohol use: No   Drug use: No    Home Medications Prior to Admission medications   Medication Sig Start Date End Date Taking? Authorizing Provider  acetaminophen (TYLENOL) 325 MG tablet     [provider]  apixaban (ELIQUIS) 5 MG TABS tablet Take 2 tablets (10 mg total) by mouth 2 (two) times daily. 03/02/16   Debbe Odea, MD  B Complex-C (B-COMPLEX WITH  VITAMIN C) tablet Take 1 tablet by mouth daily.    [provider]  budesonide-formoterol (SYMBICORT) 160-4.5 MCG/ACT inhaler Inhale 2 puffs into the lungs 2 (two) times daily.    [provider]  Cholecalciferol (VITAMIN D3 PO) Take 1 tablet by mouth daily.    [provider]  diltiazem (CARDIZEM CD) 120 MG 24 hr capsule  05/05/20   [provider]  diltiazem (CARDIZEM SR) 120 MG 12 hr capsule Take 1 capsule (120 mg total) by mouth 2 (two) times daily. 03/02/16   Debbe Odea, MD  furosemide (LASIX) 20 MG tablet Take 20 mg by mouth daily.    [provider]  levothyroxine (SYNTHROID, LEVOTHROID) 88 MCG tablet Take 88 mcg by mouth daily before breakfast.    [provider]  magnesium oxide (MAG-OX) 400 MG tablet Take 400 mg by mouth daily.    [provider]  metoprolol (LOPRESSOR) 50 MG tablet Take 12.5 mg by mouth 2 (two) times daily.     [provider]  montelukast (SINGULAIR) 10 MG tablet     [provider]  omeprazole (PRILOSEC) 40 MG capsule Take 40 mg by mouth daily.    [provider]  VENTOLIN HFA 108 (90 BASE) MCG/ACT inhaler Inhale 2 puffs into the lungs every 4 (four) hours as needed for shortness of breath.  09/21/14   [provider]    Allergies    Myrbetriq [mirabegron]  Review of Systems   Review of Systems  Constitutional:  Negative for fever.  HENT:  Negative for ear pain and sore throat.   Eyes:  Negative for visual disturbance.  Respiratory:  Negative for cough and shortness of breath.   Cardiovascular:  Negative for chest pain.  Gastrointestinal:  Positive for abdominal pain, diarrhea, nausea and vomiting.  Genitourinary:  Negative for dysuria and hematuria.  Musculoskeletal:  Negative for back pain.  Skin:  Negative for rash.  Neurological:  Negative for headaches.  All other systems reviewed and are negative.  Physical Exam Updated Vital Signs BP 122/64   Pulse 73   Temp 99.4 F (37.4 C) (Rectal)   Resp 13   SpO2 92%   Physical Exam Vitals and nursing note reviewed.  Constitutional:      General: She is not in acute distress.    Appearance: She is well-developed.  HENT:     Head: Normocephalic and atraumatic.  Eyes:     Conjunctiva/sclera: Conjunctivae normal.  Cardiovascular:     Rate and Rhythm: Normal rate and regular rhythm.     Heart sounds: Normal heart sounds. No murmur heard. Pulmonary:     Effort: Pulmonary effort is normal. No respiratory distress.     Breath sounds: Normal breath sounds. No wheezing, rhonchi or rales.  Abdominal:     General: Bowel sounds are normal.     Palpations: Abdomen is soft.     Tenderness: There is abdominal  tenderness in the epigastric area. There is guarding. There is no rebound.  Musculoskeletal:     Cervical back: Neck supple.  Skin:    General: Skin is warm and dry.  Neurological:     Mental Status: She is alert.    ED Results / Procedures / Treatments   Labs (all labs ordered are listed, but only abnormal results are displayed) Labs Reviewed  COMPREHENSIVE METABOLIC PANEL - Abnormal; Notable for the following components:      Result Value   Glucose, Bld 113 (*)  Creatinine, Ser 1.31 (*)    Alkaline Phosphatase 173 (*)    GFR, Estimated 42 (*)    All other components within normal limits  CBC - Abnormal; Notable for the following components:   RDW 15.8 (*)    All other components within normal limits  URINALYSIS, ROUTINE W REFLEX MICROSCOPIC - Abnormal; Notable for the following components:   APPearance HAZY (*)    Specific Gravity, Urine >1.046 (*)    All other components within normal limits  HEPARIN LEVEL (UNFRACTIONATED) - Abnormal; Notable for the following components:   Heparin Unfractionated >1.10 (*)    All other components within normal limits  GASTROINTESTINAL PANEL BY PCR, STOOL (REPLACES STOOL CULTURE)  RESP PANEL BY RT-PCR (FLU A&B, COVID) ARPGX2  LIPASE, BLOOD  APTT  BASIC METABOLIC PANEL  CBC  MAGNESIUM  PHOSPHORUS    EKG None  Radiology CT ABDOMEN PELVIS W CONTRAST  Result Date: 10/27/2020 CLINICAL DATA:  Acute generalized abdominal pain. EXAM: CT ABDOMEN AND PELVIS WITH CONTRAST TECHNIQUE: Multidetector CT imaging of the abdomen and pelvis was performed using the standard protocol following bolus administration of intravenous contrast. CONTRAST:  6m OMNIPAQUE IOHEXOL 350 MG/ML SOLN COMPARISON:  November 13, 2012. FINDINGS: Lower chest: No acute abnormality. Hepatobiliary: No focal liver abnormality is seen. Status post cholecystectomy. No biliary dilatation. Pancreas: Unremarkable. No pancreatic ductal dilatation or surrounding inflammatory changes.  Spleen: Normal in size without focal abnormality. Adrenals/Urinary Tract: Adrenal glands appear normal. Bilateral renal cysts are noted. Nonobstructive right nephrolithiasis is noted. No hydronephrosis or renal obstruction is noted. Urinary bladder is unremarkable. Stomach/Bowel: Mild gastric distention is noted. Wall thickening of the distal stomach is noted which may represent inflammation or peptic ulcer disease. No colonic dilatation is noted. Dilated small bowel loops are noted in the right lower quadrant which may represent ileus, but distal small bowel obstruction cannot be excluded. The appendix is unremarkable. Vascular/Lymphatic: Aortic atherosclerosis. No enlarged abdominal or pelvic lymph nodes. Reproductive: Probable calcified uterine fibroid is noted. No adnexal abnormality is noted. Other: Moderate size fat containing periumbilical hernia is noted. No ascites is noted. Musculoskeletal: No acute or significant osseous findings. IMPRESSION: Dilated small bowel loops are noted in the right lower quadrant which may represent ileus, but distal small bowel obstruction cannot be excluded. Mild gastric distention is noted with wall thickening of the distal stomach suggesting possible inflammation or peptic ulcer disease. Nonobstructive right nephrolithiasis is noted. Moderate size fat containing periumbilical hernia. Aortic Atherosclerosis (ICD10-I70.0). Electronically Signed   By: JMarijo ConceptionM.D.   On: 10/27/2020 15:18    Procedures Procedures   Medications Ordered in ED Medications  pantoprazole (PROTONIX) injection 40 mg (has no administration in time range)  metoprolol tartrate (LOPRESSOR) tablet 12.5 mg (has no administration in time range)  levothyroxine (SYNTHROID) tablet 88 mcg (has no administration in time range)  mometasone-formoterol (DULERA) 200-5 MCG/ACT inhaler 2 puff (has no administration in time range)  montelukast (SINGULAIR) tablet 10 mg (has no administration in time  range)  albuterol (VENTOLIN HFA) 108 (90 Base) MCG/ACT inhaler 2 puff (has no administration in time range)  0.45 % NaCl with KCl 20 mEq / L infusion (has no administration in time range)  acetaminophen (TYLENOL) tablet 650 mg (has no administration in time range)    Or  acetaminophen (TYLENOL) suppository 650 mg (has no administration in time range)  morphine 2 MG/ML injection 2 mg (has no administration in time range)  ondansetron (ZOFRAN) tablet 4 mg (has no administration  in time range)    Or  ondansetron (ZOFRAN) injection 4 mg (has no administration in time range)  diltiazem (CARDIZEM CD) 24 hr capsule 120 mg (has no administration in time range)  sodium chloride 0.9 % bolus 500 mL (0 mLs Intravenous Stopped 10/27/20 1554)  morphine 4 MG/ML injection 4 mg (4 mg Intravenous Given 10/27/20 1338)  ondansetron (ZOFRAN) injection 4 mg (4 mg Intravenous Given 10/27/20 1338)  iohexol (OMNIPAQUE) 350 MG/ML injection 100 mL (60 mLs Intravenous Contrast Given 10/27/20 1428)  morphine 4 MG/ML injection 4 mg (4 mg Intravenous Given 10/27/20 1700)  alum & mag hydroxide-simeth (MAALOX/MYLANTA) 200-200-20 MG/5ML suspension 30 mL (30 mLs Oral Given 10/27/20 1658)    And  lidocaine (XYLOCAINE) 2 % viscous mouth solution 15 mL (15 mLs Oral Given 10/27/20 1658)    ED Course  I have reviewed the triage vital signs and the nursing notes.  Pertinent labs & imaging results that were available during my care of the patient were reviewed by me and considered in my medical decision making (see chart for details).    MDM Rules/Calculators/A&P                          75 year old female presenting the emergency department today for evaluation of abdominal pain and diarrhea for the last week.  She also is developed some nausea and vomiting today.  Reviewed/interpreted labs CBC is without leukocytosis or anemia CMP with mildly elevated creatinine Lipase neg UA neg COVID pending on arrival GI pathogen panel  pending  Reviewed/interpreted imaging CT abd/pelvis - Dilated small bowel loops are noted in the right lower quadrant which may represent ileus, but distal small bowel obstruction cannot be excluded. Mild gastric distention is noted with wall thickening of the distal stomach suggesting possible inflammation or peptic ulcer disease. Nonobstructive right nephrolithiasis is noted. Moderate size fat containing periumbilical hernia. Aortic Atherosclerosis  3:38 PM CONSULT With Claiborne Billings, [place] with general surgery.  She states that surgery will come evaluate the patient and though she feels that this is most likely related to enteritis rather than a small bowel obstruction.  General surgery evaluated the patient.  They do not feel the patient presentation CT scan is consistent with a small bowel obstruction and do feel that this is most likely related to enteritis.  They do not recommend an NG tube at this time but they will follow with the patient while she is admitted to the hospital.  4:55 PM CONSULT With Dr. Sloan Leiter who accepts patient for admission   Final Clinical Impression(s) / ED Diagnoses Final diagnoses:  Enteritis    Rx / DC Orders ED Discharge Orders     None        Bishop Dublin 10/27/20 1859    Teressa Lower, MD 10/29/20 0030

## 2020-10-27 NOTE — H&P (Signed)
History and Physical    Shannon Hammond [id number] DOB: 06-Dec-1945 DOA: 10/27/2020  PCP: Deland Pretty, MD  Patient coming from: Home  I have personally briefly reviewed patient's old medical records available.   Chief Complaint: Abdominal pain and distention  HPI: Shannon Hammond is a 75 y.o. 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  According to the patient she started with diffuse abdominal pain and had multiple episodes of watery diarrhea since Monday.  Pain is mostly in the upper abdomen that is burning in quality, spreads across the upper abdomen.  She also has increased flatulence. Feels cold.  Stool is mostly watery no blood no mucus on it.  No fever.  No urinary symptoms.   Reportedly her daughter also had similar diarrhea that improved.   Had some improvement with Imodium 2 days ago but again recurred. She had 4 small liquidy stool today morning before arriving to the emergency room. Pain improved with morphine injection. Taking sips of water. Daughter has similar symptoms.  They live together. ED Course: At the emergency room afebrile.  Blood pressure is stable.  Creatinine 1.3.  CT scan consistent with distended small bowel loops at the right lower quadrant, distended stomach.  Patient with ongoing diarrhea.  Surgery evaluated the patient in the emergency room and treating as ileus with follow-up.  Hospitalist team consulted for admission to the hospital with surgical follow-up.  Review of Systems: all systems are reviewed and pertinent positive as per HPI otherwise rest are negative.    Past Medical History:  Diagnosis Date   Anginal pain ([place])    Arthritis    BACK AND KNEES   DVT (deep venous  thrombosis) (HCC) 03/19/2010   RIGHT LEG   GERD (gastroesophageal reflux disease)    Heart murmur    Hemorrhoids    History of kidney stones    History of nocturia    Hypertension    Hypothyroidism    PE (pulmonary embolism)    HX PE 2012 - TX'D IN [facility]- HOSPITALIZED X 1 WEEK.   NO LONGER ON BLOOD THINNER OTHER THAN 81 MG ASPIRIN   Seizures ([place]) 03/19/2005   ONLY ONCE - THOUGHT TO BE STRESS INDUCED - PT WAS DEALING WITH HER MOTHER'S DEATH   Shortness of breath    PT TOLD BY HER MEDICAL DOCTOR - SOME LUNG CHANGES DUE TO 2ND HAND SMOKE - SHE WAS GIVEN INHALER TO USE AS NEEDED.    Past Surgical History:  Procedure Laterality Date   BREAST SURGERY  1993   CYST REMOVED LEFT BREAST   CHOLECYSTECTOMY  LATE 1990'S   CYSTOSCOPY WITH RETROGRADE PYELOGRAM, URETEROSCOPY AND STENT PLACEMENT Right 11/27/2012   Procedure: CYSTOSCOPY WITH RETROGRADE PYELOGRAM, URETEROSCOPY, stone basketry AND STENT PLACEMENT;  Surgeon: Franchot Gallo, MD;  Location: [facility];  Service: Urology;  Laterality: Right;   HOLMIUM LASER APPLICATION Right [id number]   Procedure: HOLMIUM LASER APPLICATION;  Surgeon: Franchot Gallo, MD;  Location: [facility];  Service: Urology;  Laterality: Right;   LITHOTRIPSY     BOTH KIDNEYS    Social history   reports that she has never smoked. She has never used smokeless tobacco. She reports that she does not drink alcohol and does not use drugs.  Allergies  Allergen Reactions   Myrbetriq [Mirabegron]     Family History  Problem Relation Age of Onset   Heart attack Father    Clotting disorder Neg Hx      Prior to Admission medications   Medication Sig Start Date End Date Taking? Authorizing Provider  acetaminophen (TYLENOL) 325 MG tablet     [provider]  apixaban (ELIQUIS) 5 MG TABS tablet Take 2 tablets (10 mg total) by mouth 2 (two) times daily. 03/02/16   Debbe Odea, MD  B Complex-C (B-COMPLEX WITH VITAMIN C) tablet Take 1 tablet by mouth  daily.    [provider]  budesonide-formoterol (SYMBICORT) 160-4.5 MCG/ACT inhaler Inhale 2 puffs into the lungs 2 (two) times daily.    [provider]  Cholecalciferol (VITAMIN D3 PO) Take 1 tablet by mouth daily.    [provider]  diltiazem (CARDIZEM CD) 120 MG 24 hr capsule  05/05/20   [provider]  diltiazem (CARDIZEM SR) 120 MG 12 hr capsule Take 1 capsule (120 mg total) by mouth 2 (two) times daily. 03/02/16   Debbe Odea, MD  furosemide (LASIX) 20 MG tablet Take 20 mg by mouth daily.    [provider]  levothyroxine (SYNTHROID, LEVOTHROID) 88 MCG tablet Take 88 mcg by mouth daily before breakfast.    [provider]  magnesium oxide (MAG-OX) 400 MG tablet Take 400 mg by mouth daily.    [provider]  metoprolol (LOPRESSOR) 50 MG tablet Take 12.5 mg by mouth 2 (two) times daily.     [provider]  montelukast (SINGULAIR) 10 MG tablet     [provider]  omeprazole (PRILOSEC) 40 MG capsule Take 40 mg by mouth daily.    [provider]  VENTOLIN HFA 108 (90 BASE) MCG/ACT inhaler Inhale 2 puffs into the lungs every 4 (four) hours as needed for shortness of breath.  09/21/14   [provider]    Physical Exam: Vitals:   10/27/20 1521 10/27/20 1530 10/27/20 1630 10/27/20 1730  BP: 127/83 128/66 115/86 122/64  Pulse: 77 72 83 73  Resp: '15 14 15 13  '$ Temp:      TempSrc:      SpO2: 95% 94% 92% 92%    Constitutional: NAD, calm, comfortable.  Morbidly obese.  Not in any distress.  Able to have good conversation. Vitals:   10/27/20 1521 10/27/20 1530 10/27/20 1630 10/27/20 1730  BP: 127/83 128/66 115/86 122/64  Pulse: 77 72 83 73  Resp: '15 14 15 13  '$ Temp:      TempSrc:      SpO2: 95% 94% 92% 92%   Eyes: PERRL, lids and conjunctivae normal ENMT: Mucous membranes are moist. Posterior pharynx clear of any exudate or lesions.Normal dentition.  Neck: normal, supple, no masses, no  thyromegaly Respiratory: clear to auscultation bilaterally, no wheezing, no crackles. Normal respiratory effort. No accessory muscle use.  Cardiovascular: Regular rate and rhythm, no murmurs / rubs / gallops. No extremity edema. 2+ pedal pulses. No carotid bruits.  Abdomen: Mild tenderness in the epigastrium.  No rigidity or guarding.  Bowel sounds present. Obese and pendulous. Musculoskeletal: no clubbing / cyanosis. No joint deformity upper and lower extremities. Good ROM, no contractures. Normal muscle tone.  Skin: no rashes, lesions, ulcers. No induration Neurologic: CN 2-12 grossly intact. Sensation intact, DTR normal. Strength 5/5 in all 4.  Psychiatric: Normal judgment and insight. Alert and oriented x 3. Normal mood.     Labs on  Admission: I have personally reviewed following labs and imaging studies  CBC: Recent Labs  Lab 10/27/20 1224  WBC 9.1  HGB 14.5  HCT 44.2  MCV 89.7  PLT 99991111   Basic Metabolic Panel: Recent Labs  Lab 10/27/20 1224  NA 141  K 3.8  CL 109  CO2 24  GLUCOSE 113*  BUN 16  CREATININE 1.31*  CALCIUM 9.4   GFR: CrCl cannot be calculated (Unknown ideal weight.). Liver Function Tests: Recent Labs  Lab 10/27/20 1224  AST 35  ALT 41  ALKPHOS 173*  BILITOT 0.7  PROT 7.3  ALBUMIN 3.7   Recent Labs  Lab 10/27/20 1224  LIPASE 24   No results for input(s): AMMONIA in the last 168 hours. Coagulation Profile: No results for input(s): INR, PROTIME in the last 168 hours. Cardiac Enzymes: No results for input(s): CKTOTAL, CKMB, CKMBINDEX, TROPONINI in the last 168 hours. BNP (last 3 results) No results for input(s): PROBNP in the last 8760 hours. HbA1C: No results for input(s): HGBA1C in the last 72 hours. CBG: No results for input(s): GLUCAP in the last 168 hours. Lipid Profile: No results for input(s): CHOL, HDL, LDLCALC, TRIG, CHOLHDL, LDLDIRECT in the last 72 hours. Thyroid Function Tests: No results for input(s): TSH, T4TOTAL,  FREET4, T3FREE, THYROIDAB in the last 72 hours. Anemia Panel: No results for input(s): VITAMINB12, FOLATE, FERRITIN, TIBC, IRON, RETICCTPCT in the last 72 hours. Urine analysis:    Component Value Date/Time   COLORURINE YELLOW 10/27/2020 1543   APPEARANCEUR HAZY (A) 10/27/2020 1543   LABSPEC >1.046 (H) 10/27/2020 1543   PHURINE 5.0 10/27/2020 1543   GLUCOSEU NEGATIVE 10/27/2020 1543   HGBUR NEGATIVE 10/27/2020 1543   BILIRUBINUR NEGATIVE 10/27/2020 1543   KETONESUR NEGATIVE 10/27/2020 1543   PROTEINUR NEGATIVE 10/27/2020 1543   NITRITE NEGATIVE 10/27/2020 1543   LEUKOCYTESUR NEGATIVE 10/27/2020 1543    Radiological Exams on Admission: CT ABDOMEN PELVIS W CONTRAST  Result Date: 10/27/2020 CLINICAL DATA:  Acute generalized abdominal pain. EXAM: CT ABDOMEN AND PELVIS WITH CONTRAST TECHNIQUE: Multidetector CT imaging of the abdomen and pelvis was performed using the standard protocol following bolus administration of intravenous contrast. CONTRAST:  17m OMNIPAQUE IOHEXOL 350 MG/ML SOLN COMPARISON:  November 13, 2012. FINDINGS: Lower chest: No acute abnormality. Hepatobiliary: No focal liver abnormality is seen. Status post cholecystectomy. No biliary dilatation. Pancreas: Unremarkable. No pancreatic ductal dilatation or surrounding inflammatory changes. Spleen: Normal in size without focal abnormality. Adrenals/Urinary Tract: Adrenal glands appear normal. Bilateral renal cysts are noted. Nonobstructive right nephrolithiasis is noted. No hydronephrosis or renal obstruction is noted. Urinary bladder is unremarkable. Stomach/Bowel: Mild gastric distention is noted. Wall thickening of the distal stomach is noted which may represent inflammation or peptic ulcer disease. No colonic dilatation is noted. Dilated small bowel loops are noted in the right lower quadrant which may represent ileus, but distal small bowel obstruction cannot be excluded. The appendix is unremarkable. Vascular/Lymphatic: Aortic  atherosclerosis. No enlarged abdominal or pelvic lymph nodes. Reproductive: Probable calcified uterine fibroid is noted. No adnexal abnormality is noted. Other: Moderate size fat containing periumbilical hernia is noted. No ascites is noted. Musculoskeletal: No acute or significant osseous findings. IMPRESSION: Dilated small bowel loops are noted in the right lower quadrant which may represent ileus, but distal small bowel obstruction cannot be excluded. Mild gastric distention is noted with wall thickening of the distal stomach suggesting possible inflammation or peptic ulcer disease. Nonobstructive right nephrolithiasis is noted. Moderate size fat containing periumbilical hernia. Aortic Atherosclerosis (  ICD10-I70.0). Electronically Signed   By: Marijo Conception M.D.   On: 10/27/2020 15:18    EKG: Independently reviewed.  Normal sinus rhythm.  Low voltage EKG.  No acute changes.  Assessment/Plan Principal Problem:   Abdominal pain Active Problems:   Subacute massive pulmonary embolism (HCC)   Paroxysmal A-fib (HCC)   CKD (chronic kidney disease) stage 4, GFR 15-29 ml/min (HCC)     1.  Nausea vomiting diarrhea with partial small bowel obstruction: Agree with monitoring in the hospital. N.p.o. except ice chips and sips.  Adequate pain medications.  Mobility.  Continue IV fluids with potassium supplements.  Recheck electrolytes including magnesium phosphorus in the morning labs. Serial abdominal exam.  Currently NG tube decompression not indicated as per surgery, will insert if more distention and pain. GI pathogen panel ordered.  COVID-19 pending. This is likely enteritis and most likely viral enteritis.  Anticipate spontaneous improvement.  2.  Recurrent pulmonary embolism with history of submassive PE: Hold Eliquis in anticipation of any procedures.  Patient has history of submassive PE, she is very high risk for pulmonary embolism, will anticoagulate with heparin.  Last dose of Eliquis 8/11  morning.  3.  Acute kidney injury: No recent comparison available.  IV fluid resuscitation and recheck.  4.  Paroxysmal A. fib: Currently sinus rhythm.  Rate controlled on Cardizem and metoprolol.  Eliquis on hold and will start on heparin as above.  5.  GERD: On PPI.  Changing to IV.  6.  Hypothyroidism: Replaced with Synthroid.  Continue.  7.  COPD: Stable.  Steroid inhalers and nebulizer as needed.    DVT prophylaxis: Heparin infusion Code Status: Full code Family Communication: None Disposition Plan: Home Consults called: Surgery Admission status: Observation.  MedSurg bed.   Barb Merino MD [facility] Pager [phone]

## 2020-10-27 NOTE — ED Provider Notes (Signed)
Emergency Medicine Provider Triage Evaluation Note  Shannon Hammond , a 75 y.o. female  was evaluated in triage.  Pt complains of diarrhea, abd pain and nv that started 1 week ago.  Review of Systems  Positive: Abd pain, nvd Negative: fever  Physical Exam  BP 134/89   Pulse 99   Temp (!) 97.5 F (36.4 C) (Oral)   Resp 18   SpO2 95%  Gen:   Awake, no distress   Resp:  Normal effort  MSK:   Moves extremities without difficulty  Other:  Epigastric TTP with guarding  Medical Decision Making  Medically screening exam initiated at 12:08 PM.  Appropriate orders placed.  Shannon Hammond was informed that the remainder of the evaluation will be completed by another provider, this initial triage assessment does not replace that evaluation, and the importance of remaining in the ED until their evaluation is complete.     Bishop Dublin 10/27/20 1209    Teressa Lower, MD 10/27/20 [phone]

## 2020-10-28 ENCOUNTER — Observation Stay (HOSPITAL_COMMUNITY): Payer: Medicare HMO

## 2020-10-28 DIAGNOSIS — Z79899 Other long term (current) drug therapy: Secondary | ICD-10-CM | POA: Diagnosis not present

## 2020-10-28 DIAGNOSIS — K219 Gastro-esophageal reflux disease without esophagitis: Secondary | ICD-10-CM | POA: Diagnosis present

## 2020-10-28 DIAGNOSIS — E669 Obesity, unspecified: Secondary | ICD-10-CM | POA: Diagnosis present

## 2020-10-28 DIAGNOSIS — I129 Hypertensive chronic kidney disease with stage 1 through stage 4 chronic kidney disease, or unspecified chronic kidney disease: Secondary | ICD-10-CM | POA: Diagnosis present

## 2020-10-28 DIAGNOSIS — I2699 Other pulmonary embolism without acute cor pulmonale: Secondary | ICD-10-CM | POA: Diagnosis not present

## 2020-10-28 DIAGNOSIS — Z7722 Contact with and (suspected) exposure to environmental tobacco smoke (acute) (chronic): Secondary | ICD-10-CM | POA: Diagnosis present

## 2020-10-28 DIAGNOSIS — N184 Chronic kidney disease, stage 4 (severe): Secondary | ICD-10-CM | POA: Diagnosis present

## 2020-10-28 DIAGNOSIS — Z8249 Family history of ischemic heart disease and other diseases of the circulatory system: Secondary | ICD-10-CM | POA: Diagnosis not present

## 2020-10-28 DIAGNOSIS — Z86718 Personal history of other venous thrombosis and embolism: Secondary | ICD-10-CM | POA: Diagnosis not present

## 2020-10-28 DIAGNOSIS — Z6841 Body Mass Index (BMI) 40.0 and over, adult: Secondary | ICD-10-CM | POA: Diagnosis not present

## 2020-10-28 DIAGNOSIS — Z20822 Contact with and (suspected) exposure to covid-19: Secondary | ICD-10-CM | POA: Diagnosis present

## 2020-10-28 DIAGNOSIS — K529 Noninfective gastroenteritis and colitis, unspecified: Secondary | ICD-10-CM | POA: Diagnosis not present

## 2020-10-28 DIAGNOSIS — A084 Viral intestinal infection, unspecified: Secondary | ICD-10-CM | POA: Diagnosis present

## 2020-10-28 DIAGNOSIS — Z79890 Hormone replacement therapy (postmenopausal): Secondary | ICD-10-CM | POA: Diagnosis not present

## 2020-10-28 DIAGNOSIS — Z888 Allergy status to other drugs, medicaments and biological substances status: Secondary | ICD-10-CM | POA: Diagnosis not present

## 2020-10-28 DIAGNOSIS — K567 Ileus, unspecified: Secondary | ICD-10-CM | POA: Diagnosis present

## 2020-10-28 DIAGNOSIS — E039 Hypothyroidism, unspecified: Secondary | ICD-10-CM | POA: Diagnosis present

## 2020-10-28 DIAGNOSIS — J449 Chronic obstructive pulmonary disease, unspecified: Secondary | ICD-10-CM | POA: Diagnosis present

## 2020-10-28 DIAGNOSIS — Z86711 Personal history of pulmonary embolism: Secondary | ICD-10-CM | POA: Diagnosis not present

## 2020-10-28 DIAGNOSIS — N179 Acute kidney failure, unspecified: Secondary | ICD-10-CM | POA: Diagnosis present

## 2020-10-28 DIAGNOSIS — R1013 Epigastric pain: Secondary | ICD-10-CM | POA: Diagnosis not present

## 2020-10-28 DIAGNOSIS — Z7901 Long term (current) use of anticoagulants: Secondary | ICD-10-CM | POA: Diagnosis not present

## 2020-10-28 DIAGNOSIS — I480 Paroxysmal atrial fibrillation: Secondary | ICD-10-CM | POA: Diagnosis present

## 2020-10-28 DIAGNOSIS — Z7951 Long term (current) use of inhaled steroids: Secondary | ICD-10-CM | POA: Diagnosis not present

## 2020-10-28 LAB — BASIC METABOLIC PANEL
Anion gap: 8 (ref 5–15)
BUN: 15 mg/dL (ref 8–23)
CO2: 24 mmol/L (ref 22–32)
Calcium: 8.8 mg/dL — ABNORMAL LOW (ref 8.9–10.3)
Chloride: 109 mmol/L (ref 98–111)
Creatinine, Ser: 1.3 mg/dL — ABNORMAL HIGH (ref 0.44–1.00)
GFR, Estimated: 42 mL/min — ABNORMAL LOW (ref >60)
Glucose, Bld: 105 mg/dL — ABNORMAL HIGH (ref 70–99)
Potassium: 3.6 mmol/L (ref 3.5–5.1)
Sodium: 141 mmol/L (ref 135–145)

## 2020-10-28 LAB — CBC
HCT: 40 % (ref 36.0–46.0)
Hemoglobin: 12.8 g/dL (ref 12.0–15.0)
MCH: 29.1 pg (ref 26.0–34.0)
MCHC: 32 g/dL (ref 30.0–36.0)
MCV: 90.9 fL (ref 80.0–100.0)
Platelets: 216 10*3/uL (ref 150–400)
RBC: 4.4 MIL/uL (ref 3.87–5.11)
RDW: 16 % — ABNORMAL HIGH (ref 11.5–15.5)
WBC: 9.1 10*3/uL (ref 4.0–10.5)
nRBC: 0 % (ref 0.0–0.2)

## 2020-10-28 LAB — APTT
aPTT: 57 seconds — ABNORMAL HIGH (ref 24–36)
aPTT: 83 seconds — ABNORMAL HIGH (ref 24–36)

## 2020-10-28 LAB — HEPARIN LEVEL (UNFRACTIONATED): Heparin Unfractionated: >1.1 IU/mL — ABNORMAL HIGH (ref 0.30–0.70)

## 2020-10-28 LAB — PHOSPHORUS: Phosphorus: 3.1 mg/dL (ref 2.5–4.6)

## 2020-10-28 LAB — MAGNESIUM: Magnesium: 1.5 mg/dL — ABNORMAL LOW (ref 1.7–2.4)

## 2020-10-28 MED ORDER — BOOST / RESOURCE BREEZE PO LIQD CUSTOM
1.0000 | Freq: Two times a day (BID) | ORAL | Status: DC
  Administered 2020-10-28 – 2020-10-30 (×2): 1 via ORAL

## 2020-10-28 MED ORDER — PANTOPRAZOLE SODIUM 40 MG PO TBEC
40.0000 mg | DELAYED_RELEASE_TABLET | Freq: Two times a day (BID) | ORAL | Status: DC
  Administered 2020-10-28 – 2020-10-29 (×2): 40 mg via ORAL
  Filled 2020-10-28 (×2): qty 1

## 2020-10-28 MED ORDER — ADULT MULTIVITAMIN W/MINERALS CH
1.0000 | ORAL_TABLET | Freq: Every day | ORAL | Status: DC
  Administered 2020-10-28 – 2020-10-30 (×3): 1 via ORAL
  Filled 2020-10-28 (×3): qty 1

## 2020-10-28 MED ORDER — MAGNESIUM SULFATE 2 GM/50ML IV SOLN
2.0000 g | Freq: Once | INTRAVENOUS | Status: AC
  Administered 2020-10-28: 2 g via INTRAVENOUS
  Filled 2020-10-28: qty 50

## 2020-10-28 MED ORDER — SIMETHICONE 80 MG PO CHEW: 80.0000 mg | CHEWABLE_TABLET | Freq: Four times a day (QID) | ORAL | Status: DC | PRN

## 2020-10-28 MED ORDER — PROSOURCE PLUS PO LIQD
30.0000 mL | Freq: Two times a day (BID) | ORAL | Status: DC
  Filled 2020-10-28 (×2): qty 30

## 2020-10-28 NOTE — Progress Notes (Signed)
   Subjective/Chief Complaint: Had one episode of emesis last night Now feeling better No BM overnight   Objective: Vital signs in last 24 hours: Temp:  [97.5 F (36.4 C)-99.4 F (37.4 C)] 98.4 F (36.9 C) (08/12 0406) Pulse Rate:  [57-108] 84 (08/12 0406) Resp:  [10-18] 18 (08/12 0406) BP: (115-165)/(55-99) 132/59 (08/12 0406) SpO2:  [88 %-99 %] 95 % (08/12 0757) Last BM Date: 10/27/20  Intake/Output from previous day: 08/11 0701 - 08/12 0700 In: 1001.9 [I.V.:1001.9] Out: 650 [Urine:150; Emesis/NG output:500] Intake/Output this shift: No intake/output data recorded.  Exam: Awake and alert Abdomen obese, soft, non-tender  Lab Results:  Recent Labs    10/27/20 1224 10/28/20 0441  WBC 9.1 9.1  HGB 14.5 12.8  HCT 44.2 40.0  PLT 277 216   BMET Recent Labs    10/27/20 1224 10/28/20 0441  NA 141 141  K 3.8 3.6  CL 109 109  CO2 24 24  GLUCOSE 113* 105*  BUN 16 15  CREATININE 1.31* 1.30*  CALCIUM 9.4 8.8*   PT/INR No results for input(s): LABPROT, INR in the last 72 hours. ABG No results for input(s): PHART, HCO3 in the last 72 hours.  Invalid input(s): PCO2, PO2  Studies/Results: CT ABDOMEN PELVIS W CONTRAST  Result Date: 10/27/2020 CLINICAL DATA:  Acute generalized abdominal pain. EXAM: CT ABDOMEN AND PELVIS WITH CONTRAST TECHNIQUE: Multidetector CT imaging of the abdomen and pelvis was performed using the standard protocol following bolus administration of intravenous contrast. CONTRAST:  44m OMNIPAQUE IOHEXOL 350 MG/ML SOLN COMPARISON:  November 13, 2012. FINDINGS: Lower chest: No acute abnormality. Hepatobiliary: No focal liver abnormality is seen. Status post cholecystectomy. No biliary dilatation. Pancreas: Unremarkable. No pancreatic ductal dilatation or surrounding inflammatory changes. Spleen: Normal in size without focal abnormality. Adrenals/Urinary Tract: Adrenal glands appear normal. Bilateral renal cysts are noted. Nonobstructive right  nephrolithiasis is noted. No hydronephrosis or renal obstruction is noted. Urinary bladder is unremarkable. Stomach/Bowel: Mild gastric distention is noted. Wall thickening of the distal stomach is noted which may represent inflammation or peptic ulcer disease. No colonic dilatation is noted. Dilated small bowel loops are noted in the right lower quadrant which may represent ileus, but distal small bowel obstruction cannot be excluded. The appendix is unremarkable. Vascular/Lymphatic: Aortic atherosclerosis. No enlarged abdominal or pelvic lymph nodes. Reproductive: Probable calcified uterine fibroid is noted. No adnexal abnormality is noted. Other: Moderate size fat containing periumbilical hernia is noted. No ascites is noted. Musculoskeletal: No acute or significant osseous findings. IMPRESSION: Dilated small bowel loops are noted in the right lower quadrant which may represent ileus, but distal small bowel obstruction cannot be excluded. Mild gastric distention is noted with wall thickening of the distal stomach suggesting possible inflammation or peptic ulcer disease. Nonobstructive right nephrolithiasis is noted. Moderate size fat containing periumbilical hernia. Aortic Atherosclerosis (ICD10-I70.0). Electronically Signed   By: JMarijo ConceptionM.D.   On: 10/27/2020 15:18    Anti-infectives: Anti-infectives (From admission, onward)    None       Assessment/Plan: Enteritis vs SBO  I still favor this to be enteritis given only surgery was a lap chole and her daughter now having similar symptoms  Xray shows dilated loops of small bowel but gas is in the colon Will allow clear liquids today If emesis re-develops, will recommend NG tube placement  LOS: 0 days    DCoralie Keens8/02/2021

## 2020-10-28 NOTE — Progress Notes (Signed)
ANTICOAGULATION CONSULT NOTE - follow up  Pharmacy Consult for IV heparin (on apixaban PTA) Indication: DVT  Allergies  Allergen Reactions   Myrbetriq [Mirabegron]     Patient Measurements:   Heparin Dosing Weight: 90 kg  Vital Signs: Temp: 98.4 F (36.9 C) (08/12 0406) Temp Source: Oral (08/12 0406) BP: 132/59 (08/12 0406) Pulse Rate: 84 (08/12 0406)  Labs: Recent Labs    10/27/20 1224 10/27/20 1754 10/28/20 0441  HGB 14.5  --  12.8  HCT 44.2  --  40.0  PLT 277  --  216  APTT  --  29 57*  HEPARINUNFRC  --  >1.10* >1.10*  CREATININE 1.31*  --   --      CrCl cannot be calculated (Unknown ideal weight.).   Medical History: Past Medical History:  Diagnosis Date   Anginal pain ([place])    Arthritis    BACK AND KNEES   DVT (deep venous thrombosis) (HCC) 03/19/2010   RIGHT LEG   GERD (gastroesophageal reflux disease)    Heart murmur    Hemorrhoids    History of kidney stones    History of nocturia    Hypertension    Hypothyroidism    PE (pulmonary embolism)    HX PE 2012 - TX'D IN [place]- HOSPITALIZED X 1 WEEK.   NO LONGER ON BLOOD THINNER OTHER THAN 81 MG ASPIRIN   Seizures ([place]) 03/19/2005   ONLY ONCE - THOUGHT TO BE STRESS INDUCED - PT WAS DEALING WITH HER MOTHER'S DEATH   Shortness of breath    PT TOLD BY HER MEDICAL DOCTOR - SOME LUNG CHANGES DUE TO 2ND HAND SMOKE - SHE WAS GIVEN INHALER TO USE AS NEEDED.    Medications:  Scheduled:   diltiazem  120 mg Oral BID   levothyroxine  88 mcg Oral Q0600   metoprolol tartrate  12.5 mg Oral BID   mometasone-formoterol  2 puff Inhalation BID   montelukast  10 mg Oral QHS   pantoprazole (PROTONIX) IV  40 mg Intravenous Q12H   Infusions:   0.45 % NaCl with KCl 20 mEq / L 100 mL/hr at 10/27/20 2117   heparin 1,500 Units/hr (10/27/20 2119)   PRN: acetaminophen **OR** acetaminophen, albuterol, morphine injection, ondansetron **OR** ondansetron (ZOFRAN) IV  Assessment: 75 yo female with SBO vs enteritis  with surgery managing. Patient on apixaban PTA for hx VTE to transition to IV heparin for time being as inpatient. Baseline labs obtained. Heparin level supratherapeutic as expected due to apixaban and aPTT WNL as expected. Per [facility] note, last dose of apixaban was 8/11 am  10/28/2020 aPTT 57 subtherapeutic on 1500 units/hr Heparin level elevated as expected with apixaban still on board CBC WNL No bleeding or line interruptions per RN  Goal of Therapy:  Heparin level 0.3-0.7 units/ml aPTT 66-102 seconds Monitor platelets by anticoagulation protocol: Yes   Plan:  Increase heparin to 1700 units/hr Check aPTT in 8 hours Daily heparin level and CBC  Dolly Rias RPh 10/28/2020, 5:24 AM

## 2020-10-28 NOTE — Progress Notes (Signed)
Initial Nutrition Assessment  DOCUMENTATION CODES:   Obesity unspecified  INTERVENTION:  - will order Boost Breeze BID, each supplement provides 250 kcal and 9 grams of protein. - will order 30 ml Prosource Plus BID, each supplement provides 100 kcal and 15 grams protein.  - will order 1 tablet multivitamin with minerals/day. - diet advancement as medically feasible. - weigh patient today.    NUTRITION DIAGNOSIS:   Inadequate oral intake related to acute illness, nausea, vomiting, diarrhea as evidenced by per patient/family report.  GOAL:   Patient will meet greater than or equal to 90% of their needs  MONITOR:   PO intake, Supplement acceptance, Diet advancement, Labs, Weight trends, I & O's  REASON FOR ASSESSMENT:   Malnutrition Screening Tool  ASSESSMENT:   75 y.o. 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 She presented to the ED due to N/V/D and abdominal pain and abdominal distention x5 days.  Patient has not been weighed since 06/23/20. Diet advanced to CLD today at [facility]. She reports having an [place] ice, broth, jello, and soda today. She has not been experiencing N/V today and last episode of vomiting was yesterday.   N/V/D and abdominal pain began on Sunday night or Monday morning. She had several episodes of large volume water stools on that day. She then felt better on Tuesday and Wednesday and ate dinner on Wednesday. On Thursday ~1 AM she awoke doubled over with pain and nausea and symptoms have been persistent since that time.   She had a good appetite with no recent changes prior to this acute illness.     Labs reviewed; creatinine: 1.3 mg/dl, Mg: 1.5 mg/dl, GFR: 42 ml/min.  Medications reviewed; 88 mcg oral synthroid/day, 2 g IV Mg sulfate x1 run 8/12, 40 mg oral protonix BID.      NUTRITION - FOCUSED PHYSICAL EXAM:  Completed; no muscle or fat depletions, mild pitting edema to all extremities.   Diet Order:   Diet Order             Diet clear liquid Room service appropriate? Yes; Fluid consistency: Thin  Diet effective now                   EDUCATION NEEDS:   No education needs have been identified at this time  Skin:  Skin Assessment: Reviewed RN Assessment  Last BM:  8/11  Height:   Ht Readings from Last 1 Encounters:  06/23/20 '5\' 6"'$  (1.676 m)    Weight:   Wt Readings from Last 1 Encounters:  06/23/20 127.6 kg     Estimated Nutritional Needs:  Kcal:  1800-2000 kcal Protein:  85-100 grams Fluid:  >/= 2.3 L/day       Jarome Matin, MS, RD, LDN, CNSC Inpatient Clinical Dietitian RD pager # available in [facility]  After hours/weekend pager # available in [facility]

## 2020-10-28 NOTE — Progress Notes (Signed)
ANTICOAGULATION CONSULT NOTE - follow up  Pharmacy Consult for IV heparin (on apixaban PTA) Indication: DVT  Allergies  Allergen Reactions   Myrbetriq [Mirabegron]     Patient Measurements:   Heparin Dosing Weight: 90 kg  Vital Signs: Temp: 97.8 F (36.6 C) (08/12 1028) Temp Source: Oral (08/12 1028) BP: 104/77 (08/12 1028) Pulse Rate: 62 (08/12 1028)  Labs: Recent Labs    10/27/20 1224 10/27/20 1754 10/28/20 0441 10/28/20 1340  HGB 14.5  --  12.8  --   HCT 44.2  --  40.0  --   PLT 277  --  216  --   APTT  --  29 57* 83*  HEPARINUNFRC  --  >1.10* >1.10*  --   CREATININE 1.31*  --  1.30*  --     CrCl cannot be calculated (Unknown ideal weight.).  Assessment: 75 yo female with SBO vs enteritis with surgery managing. Patient on apixaban PTA for hx of recurrent PE to transition to IV heparin for time being as inpatient. Baseline labs obtained. Heparin level supratherapeutic as expected due to apixaban and aPTT WNL as expected.   Last dose of apixaban was 8/11 at 7a, therefore, heparin levels are falsely elevated so will monitor using aPTT for now.  10/28/2020 aPTT 83 therapeutic on 1700 units/hr Heparin level elevated as expected with apixaban still on board CBC WNL Per RN, minor bleeding at IV site, no intervention needed at this time, will monitor  Goal of Therapy:  Heparin level 0.3-0.7 units/ml aPTT 66-102 seconds Monitor platelets by anticoagulation protocol: Yes   Plan:  Continue heparin at 1700 units/hr Check aPTT in 8 hours to verify therapeutic Daily heparin level, PTT and CBC  Peggyann Juba, PharmD, BCPS Pharmacy: [phone] 10/28/2020, 2:23 PM

## 2020-10-28 NOTE — Progress Notes (Signed)
Progress Note    Shannon Hammond  [id number] DOB: 05/30/1945  DOA: 10/27/2020 PCP: Deland Pretty, MD    Brief Narrative:     Medical records reviewed and are as summarized below:  Shannon Hammond is an 75 y.o. 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  2 other family members sick as well.    Assessment/Plan:   Principal Problem:   Abdominal pain Active Problems:   Subacute massive pulmonary embolism (HCC)   Paroxysmal A-fib (HCC)   CKD (chronic kidney disease) stage 4, GFR 15-29 ml/min (HCC)   Enteritis   Nausea vomiting diarrhea -- prob viral as family is also sick GI pathogen panel ordered.  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   Acute kidney injury:  -No recent comparison available.  IV fluid resuscitation and recheck.   Paroxysmal A. fib: Currently sinus rhythm.  Rate controlled on Cardizem and metoprolol.   -Eliquis on hold  -heparin for now   GERD:  -On PPI   Hypothyroidism:  - Synthroid.  Continue.   COPD: Stable.  Steroid inhalers and nebulizer as needed.    hypomagnesemia -replete    Family Communication/Anticipated D/C date and plan/Code Status   DVT prophylaxis: heparin Code Status: Full Code.   Disposition Plan: Status is: Inpatient  Remains inpatient appropriate because:Inpatient level of care appropriate due to severity of illness  Dispo: The patient is from: Home              Anticipated d/c is to: Home               Patient currently is not medically stable to d/c.   Difficult to place patient No         Medical Consultants:   General surgery    Subjective:   Passing gas this AM  Objective:    Vitals:   10/28/20 0406 10/28/20 0754 10/28/20 0757 10/28/20 1028  BP: (!) 132/59   104/77  Pulse: 84   62  Resp: 18   20  Temp: 98.4 F (36.9 C)   97.8 F (36.6 C)  TempSrc: Oral   Oral  SpO2: 94% 95% 95% 95%    Intake/Output Summary (Last 24 hours) at 10/28/2020 1226 Last data filed at 10/28/2020 0600 Gross per 24 hour  Intake 1001.92 ml  Output 650 ml  Net 351.92 ml   There were no vitals filed for this visit.  Exam: Sitting on side of bed, NAD Rrr No increased work of breathing  Data Reviewed:   I have personally reviewed following labs and imaging studies:  Labs: Labs show the following:   Basic Metabolic Panel: Recent Labs  Lab 10/27/20 1224 10/28/20 0441  NA 141 141  K 3.8 3.6  CL 109 109  CO2 24 24  GLUCOSE 113* 105*  BUN 16 15  CREATININE 1.31* 1.30*  CALCIUM 9.4 8.8*  MG  --  1.5*  PHOS  --  3.1   GFR CrCl cannot be calculated (Unknown ideal weight.).  Liver Function Tests: Recent Labs  Lab 10/27/20 1224  AST 35  ALT 41  ALKPHOS 173*  BILITOT 0.7  PROT 7.3  ALBUMIN 3.7   Recent Labs  Lab 10/27/20 1224  LIPASE 24   No results for input(s): AMMONIA in the last 168 hours. Coagulation profile No results for input(s): INR, PROTIME in the last 168 hours.  CBC: Recent Labs  Lab 10/27/20 1224 10/28/20 0441  WBC 9.1 9.1  HGB 14.5 12.8  HCT 44.2 40.0  MCV 89.7 90.9  PLT 277 216   Cardiac Enzymes: No results for input(s): CKTOTAL, CKMB, CKMBINDEX, TROPONINI in the last 168 hours. BNP (last 3 results) No results for input(s): PROBNP in the last 8760 hours. CBG: No results for input(s): GLUCAP in the last 168 hours. D-Dimer: No results for input(s): DDIMER in the last 72 hours. Hgb A1c: No results for input(s): HGBA1C in the  last 72 hours. Lipid Profile: No results for input(s): CHOL, HDL, LDLCALC, TRIG, CHOLHDL, LDLDIRECT in the last 72 hours. Thyroid function studies: No results for input(s): TSH, T4TOTAL, T3FREE, THYROIDAB in the last 72 hours.  Invalid input(s): FREET3 Anemia work up: No results for input(s): VITAMINB12, FOLATE, FERRITIN, TIBC, IRON, RETICCTPCT in the last 72 hours. Sepsis Labs: Recent Labs  Lab 10/27/20 1224 10/28/20 0441  WBC 9.1 9.1    Microbiology Recent Results (from the past 240 hour(s))  Resp Panel by RT-PCR (Flu A&B, Covid) Nasopharyngeal Swab     Status: None   Collection Time: 10/27/20  5:04 PM   Specimen: Nasopharyngeal Swab; Nasopharyngeal(NP) swabs in vial transport medium  Result Value Ref Range Status   SARS Coronavirus 2 by RT PCR NEGATIVE NEGATIVE Final    Comment: (NOTE) SARS-CoV-2 target nucleic acids are NOT DETECTED.  The SARS-CoV-2 RNA is generally detectable in upper respiratory specimens during the acute phase of infection. The lowest concentration of SARS-CoV-2 viral copies this assay can detect is 138 copies/mL. A negative result does not preclude SARS-Cov-2 infection and should not be used as the sole basis for treatment or other patient management decisions. A negative result may occur with  improper specimen collection/handling, submission of specimen other than nasopharyngeal swab, presence of viral mutation(s) within the areas targeted by this assay, and inadequate number of viral copies(<138 copies/mL). A negative result must be combined with clinical observations, patient history, and epidemiological information. The expected result is Negative.  Fact Sheet for Patients:  EntrepreneurPulse.com.au  Fact Sheet for Healthcare Providers:  IncredibleEmployment.be  This test is no t yet approved or cleared by the [place] FDA and  has been authorized for detection and/or diagnosis of SARS-CoV-2 by FDA  under an Emergency Use Authorization (EUA). This EUA will remain  in effect (meaning this test can be used) for the duration of the COVID-19 declaration under Section 564(b)(1) of the Act, 21 U.S.C.section 360bbb-3(b)(1), unless the authorization is terminated  or revoked sooner.       Influenza A by PCR NEGATIVE NEGATIVE Final   Influenza B by PCR NEGATIVE NEGATIVE Final    Comment: (NOTE) The Xpert Xpress SARS-CoV-2/FLU/RSV plus assay is intended as an aid in the diagnosis of influenza from Nasopharyngeal swab specimens and should not be used as a sole basis for treatment. Nasal washings and aspirates are unacceptable for Xpert Xpress SARS-CoV-2/FLU/RSV testing.  Fact Sheet for Patients: EntrepreneurPulse.com.au  Fact Sheet for Healthcare Providers: IncredibleEmployment.be  This test is not yet approved or cleared by the [place] and has been authorized  for detection and/or diagnosis of SARS-CoV-2 by FDA under an Emergency Use Authorization (EUA). This EUA will remain in effect (meaning this test can be used) for the duration of the COVID-19 declaration under Section 564(b)(1) of the Act, 21 U.S.C. section 360bbb-3(b)(1), unless the authorization is terminated or revoked.  Performed at [facility], [place] [address], [place], [place] [address]     Procedures and diagnostic studies:  CT ABDOMEN PELVIS W CONTRAST  Result Date: 10/27/2020 CLINICAL DATA:  Acute generalized abdominal pain. EXAM: CT ABDOMEN AND PELVIS WITH CONTRAST TECHNIQUE: Multidetector CT imaging of the abdomen and pelvis was performed using the standard protocol following bolus administration of intravenous contrast. CONTRAST:  59m OMNIPAQUE IOHEXOL 350 MG/ML SOLN COMPARISON:  November 13, 2012. FINDINGS: Lower chest: No acute abnormality. Hepatobiliary: No focal liver abnormality is seen. Status post cholecystectomy. No biliary dilatation. Pancreas:  Unremarkable. No pancreatic ductal dilatation or surrounding inflammatory changes. Spleen: Normal in size without focal abnormality. Adrenals/Urinary Tract: Adrenal glands appear normal. Bilateral renal cysts are noted. Nonobstructive right nephrolithiasis is noted. No hydronephrosis or renal obstruction is noted. Urinary bladder is unremarkable. Stomach/Bowel: Mild gastric distention is noted. Wall thickening of the distal stomach is noted which may represent inflammation or peptic ulcer disease. No colonic dilatation is noted. Dilated small bowel loops are noted in the right lower quadrant which may represent ileus, but distal small bowel obstruction cannot be excluded. The appendix is unremarkable. Vascular/Lymphatic: Aortic atherosclerosis. No enlarged abdominal or pelvic lymph nodes. Reproductive: Probable calcified uterine fibroid is noted. No adnexal abnormality is noted. Other: Moderate size fat containing periumbilical hernia is noted. No ascites is noted. Musculoskeletal: No acute or significant osseous findings. IMPRESSION: Dilated small bowel loops are noted in the right lower quadrant which may represent ileus, but distal small bowel obstruction cannot be excluded. Mild gastric distention is noted with wall thickening of the distal stomach suggesting possible inflammation or peptic ulcer disease. Nonobstructive right nephrolithiasis is noted. Moderate size fat containing periumbilical hernia. Aortic Atherosclerosis (ICD10-I70.0). Electronically Signed   By: JMarijo ConceptionM.D.   On: 10/27/2020 15:18   DG Abd Portable 1V  Result Date: 10/28/2020 CLINICAL DATA:  Enteritis. EXAM: PORTABLE ABDOMEN - 1 VIEW COMPARISON:  Abdominopelvic CT 10/27/2020. FINDINGS: Three supine views of the abdomen again demonstrate mild asymmetric small bowel dilatation in the right abdomen. There is some gas within the colon which remains relatively decompressed. No supine evidence of free intraperitoneal air or bowel wall  thickening. Cholecystectomy clips and mild lumbar spine degenerative changes are noted. IMPRESSION: Stable mild asymmetric small bowel dilatation in the right mid abdomen in keeping with enteritis or ileus. Early/low-grade small-bowel obstruction remains a potential consideration. Electronically Signed   By: WRichardean SaleM.D.   On: 10/28/2020 08:11    Medications:    diltiazem  120 mg Oral BID   levothyroxine  88 mcg Oral Q0600   metoprolol tartrate  12.5 mg Oral BID   mometasone-formoterol  2 puff Inhalation BID   montelukast  10 mg Oral QHS   pantoprazole (PROTONIX) IV  40 mg Intravenous Q12H   Continuous Infusions:  0.45 % NaCl with KCl 20 mEq / L 100 mL/hr at 10/27/20 2117   heparin 1,700 Units/hr (10/28/20 0600)     LOS: 0 days   JGeradine Girt [facility]   How to contact the [facility]ics[facility]ending or Consulting provider [place] covering provider during after hours [place] for this patient?  Check the care team in [facility] and look for a) attending/consulting [facility] provider listed and b) the [facility] team listed Log into [email or web address] and use [place]'s universal password to access. If you do not have the password, please contact the hospital operator. Locate the [facility] provider you are looking for under [facility] and page to a number that you can be directly reached. If you still have difficulty reaching the provider, please page the [facility] (Director on Call) for the Hospitalists listed on amion for assistance.  10/28/2020, 12:26 PM

## 2020-10-29 LAB — APTT
aPTT: 101 seconds — ABNORMAL HIGH (ref 24–36)
aPTT: 96 seconds — ABNORMAL HIGH (ref 24–36)

## 2020-10-29 LAB — BASIC METABOLIC PANEL
Anion gap: 9 (ref 5–15)
BUN: 13 mg/dL (ref 8–23)
CO2: 25 mmol/L (ref 22–32)
Calcium: 9.2 mg/dL (ref 8.9–10.3)
Chloride: 106 mmol/L (ref 98–111)
Creatinine, Ser: 1.1 mg/dL — ABNORMAL HIGH (ref 0.44–1.00)
GFR, Estimated: 52 mL/min — ABNORMAL LOW (ref >60)
Glucose, Bld: 114 mg/dL — ABNORMAL HIGH (ref 70–99)
Potassium: 3.6 mmol/L (ref 3.5–5.1)
Sodium: 140 mmol/L (ref 135–145)

## 2020-10-29 LAB — CBC
HCT: 42.2 % (ref 36.0–46.0)
Hemoglobin: 13.6 g/dL (ref 12.0–15.0)
MCH: 29.2 pg (ref 26.0–34.0)
MCHC: 32.2 g/dL (ref 30.0–36.0)
MCV: 90.6 fL (ref 80.0–100.0)
Platelets: 269 10*3/uL (ref 150–400)
RBC: 4.66 MIL/uL (ref 3.87–5.11)
RDW: 15.9 % — ABNORMAL HIGH (ref 11.5–15.5)
WBC: 9.4 10*3/uL (ref 4.0–10.5)
nRBC: 0 % (ref 0.0–0.2)

## 2020-10-29 LAB — HEPARIN LEVEL (UNFRACTIONATED): Heparin Unfractionated: >1.1 IU/mL — ABNORMAL HIGH (ref 0.30–0.70)

## 2020-10-29 LAB — MAGNESIUM: Magnesium: 2.2 mg/dL (ref 1.7–2.4)

## 2020-10-29 MED ORDER — ALUM & MAG HYDROXIDE-SIMETH 200-200-20 MG/5ML PO SUSP
15.0000 mL | Freq: Four times a day (QID) | ORAL | Status: DC | PRN
  Administered 2020-10-29: 15 mL via ORAL
  Filled 2020-10-29: qty 30

## 2020-10-29 MED ORDER — SUCRALFATE 1 G PO TABS
1.0000 g | ORAL_TABLET | Freq: Three times a day (TID) | ORAL | Status: DC
  Administered 2020-10-29 – 2020-10-30 (×3): 1 g via ORAL
  Filled 2020-10-29 (×3): qty 1

## 2020-10-29 MED ORDER — PANTOPRAZOLE SODIUM 40 MG IV SOLR
40.0000 mg | Freq: Two times a day (BID) | INTRAVENOUS | Status: DC
  Administered 2020-10-30: 40 mg via INTRAVENOUS
  Filled 2020-10-29: qty 40

## 2020-10-29 MED ORDER — APIXABAN 5 MG PO TABS
5.0000 mg | ORAL_TABLET | Freq: Two times a day (BID) | ORAL | Status: DC
  Administered 2020-10-29 – 2020-10-30 (×3): 5 mg via ORAL
  Filled 2020-10-29 (×3): qty 1

## 2020-10-29 MED ORDER — ONDANSETRON HCL 4 MG/2ML IJ SOLN
4.0000 mg | Freq: Four times a day (QID) | INTRAMUSCULAR | Status: DC
  Administered 2020-10-29 – 2020-10-30 (×4): 4 mg via INTRAVENOUS
  Filled 2020-10-29 (×4): qty 2

## 2020-10-29 NOTE — Progress Notes (Signed)
Subjective/Chief Complaint: No complaints of abdominal pain Having some "heartburn" Spit up some phlegm last night, but no vomiting episodes Passing flatus - no BM No abdominal distention Her husband and daughter are both at home with similar symptoms   Objective: Vital signs in last 24 hours: Temp:  [97.7 F (36.5 C)-98.4 F (36.9 C)] 97.7 F (36.5 C) (08/13 0500) Pulse Rate:  [61-78] 66 (08/13 0500) Resp:  [16-20] 16 (08/13 0500) BP: (104-150)/(60-81) 133/81 (08/13 0500) SpO2:  [92 %-97 %] 97 % (08/13 0500) Last BM Date: 10/27/20  Intake/Output from previous day: 08/12 0701 - 08/13 0700 In: 1614.2 [P.O.:1180; I.V.:397.2; IV Piggyback:37] Out: 900 [Urine:600; Emesis/NG output:300] Intake/Output this shift: No intake/output data recorded.  Awake and alert Abdomen obese, soft, non-tender  Lab Results:  Recent Labs    10/28/20 0441 10/29/20 0509  WBC 9.1 9.4  HGB 12.8 13.6  HCT 40.0 42.2  PLT 216 269   BMET Recent Labs    10/28/20 0441 10/29/20 0509  NA 141 140  K 3.6 3.6  CL 109 106  CO2 24 25  GLUCOSE 105* 114*  BUN 15 13  CREATININE 1.30* 1.10*  CALCIUM 8.8* 9.2   PT/INR No results for input(s): LABPROT, INR in the last 72 hours. ABG No results for input(s): PHART, HCO3 in the last 72 hours.  Invalid input(s): PCO2, PO2  Studies/Results: CT ABDOMEN PELVIS W CONTRAST  Result Date: 10/27/2020 CLINICAL DATA:  Acute generalized abdominal pain. EXAM: CT ABDOMEN AND PELVIS WITH CONTRAST TECHNIQUE: Multidetector CT imaging of the abdomen and pelvis was performed using the standard protocol following bolus administration of intravenous contrast. CONTRAST:  13m OMNIPAQUE IOHEXOL 350 MG/ML SOLN COMPARISON:  November 13, 2012. FINDINGS: Lower chest: No acute abnormality. Hepatobiliary: No focal liver abnormality is seen. Status post cholecystectomy. No biliary dilatation. Pancreas: Unremarkable. No pancreatic ductal dilatation or surrounding inflammatory  changes. Spleen: Normal in size without focal abnormality. Adrenals/Urinary Tract: Adrenal glands appear normal. Bilateral renal cysts are noted. Nonobstructive right nephrolithiasis is noted. No hydronephrosis or renal obstruction is noted. Urinary bladder is unremarkable. Stomach/Bowel: Mild gastric distention is noted. Wall thickening of the distal stomach is noted which may represent inflammation or peptic ulcer disease. No colonic dilatation is noted. Dilated small bowel loops are noted in the right lower quadrant which may represent ileus, but distal small bowel obstruction cannot be excluded. The appendix is unremarkable. Vascular/Lymphatic: Aortic atherosclerosis. No enlarged abdominal or pelvic lymph nodes. Reproductive: Probable calcified uterine fibroid is noted. No adnexal abnormality is noted. Other: Moderate size fat containing periumbilical hernia is noted. No ascites is noted. Musculoskeletal: No acute or significant osseous findings. IMPRESSION: Dilated small bowel loops are noted in the right lower quadrant which may represent ileus, but distal small bowel obstruction cannot be excluded. Mild gastric distention is noted with wall thickening of the distal stomach suggesting possible inflammation or peptic ulcer disease. Nonobstructive right nephrolithiasis is noted. Moderate size fat containing periumbilical hernia. Aortic Atherosclerosis (ICD10-I70.0). Electronically Signed   By: JMarijo ConceptionM.D.   On: 10/27/2020 15:18   DG Abd Portable 1V  Result Date: 10/28/2020 CLINICAL DATA:  Enteritis. EXAM: PORTABLE ABDOMEN - 1 VIEW COMPARISON:  Abdominopelvic CT 10/27/2020. FINDINGS: Three supine views of the abdomen again demonstrate mild asymmetric small bowel dilatation in the right abdomen. There is some gas within the colon which remains relatively decompressed. No supine evidence of free intraperitoneal air or bowel wall thickening. Cholecystectomy clips and mild lumbar spine degenerative  changes  are noted. IMPRESSION: Stable mild asymmetric small bowel dilatation in the right mid abdomen in keeping with enteritis or ileus. Early/low-grade small-bowel obstruction remains a potential consideration. Electronically Signed   By: Richardean Sale M.D.   On: 10/28/2020 08:11    Anti-infectives: Anti-infectives (From admission, onward)    None       Assessment/Plan: Enteritis resulting in ileus  Clears - advance as tolerated Maalox PRN for heartburn No surgical indications at this time.  LOS: 1 day    Shannon Hammond 10/29/2020

## 2020-10-29 NOTE — Progress Notes (Signed)
ANTICOAGULATION CONSULT NOTE - follow up  Pharmacy Consult for IV heparin (on apixaban PTA) Indication: DVT  Allergies  Allergen Reactions   Myrbetriq [Mirabegron]     Patient Measurements:   Heparin Dosing Weight: 90 kg  Vital Signs: Temp: 98.2 F (36.8 C) (08/12 2200) Temp Source: Oral (08/12 2200) BP: 150/76 (08/12 2200) Pulse Rate: 78 (08/12 2200)  Labs: Recent Labs    10/27/20 1224 10/27/20 1754 10/27/20 1754 10/28/20 0441 10/28/20 1340 10/28/20 2138  HGB 14.5  --   --  12.8  --   --   HCT 44.2  --   --  40.0  --   --   PLT 277  --   --  216  --   --   APTT  --  29   < > 57* 83* 101*  HEPARINUNFRC  --  >1.10*  --  >1.10*  --   --   CREATININE 1.31*  --   --  1.30*  --   --    < > = values in this interval not displayed.    CrCl cannot be calculated (Unknown ideal weight.).  Assessment: 75 yo female with SBO vs enteritis with surgery managing. Patient on apixaban PTA for hx of recurrent PE to transition to IV heparin for time being as inpatient. Baseline labs obtained. Heparin level supratherapeutic as expected due to apixaban and aPTT WNL as expected.   Last dose of apixaban was 8/11 at 7a, therefore, heparin levels are falsely elevated so will monitor using aPTT for now.  10/29/2020 aPTT 101 therapeutic on 1700 units/hr No bleeding noted  Goal of Therapy:  Heparin level 0.3-0.7 units/ml aPTT 66-102 seconds Monitor platelets by anticoagulation protocol: Yes   Plan:  Continue heparin at 1700 units/hr Daily heparin level, PTT and CBC  Dolly Rias RPh 10/29/2020, 12:13 AM

## 2020-10-29 NOTE — Progress Notes (Signed)
ANTICOAGULATION CONSULT NOTE - follow up  Pharmacy Consult for IV heparin (on apixaban PTA) Indication: DVT  Allergies  Allergen Reactions   Myrbetriq [Mirabegron]     Patient Measurements: Height: '5\' 6"'$  (167.6 cm) Weight: 127.6 kg (281 lb 4.9 oz) IBW/kg (Calculated) : 59.3 Heparin Dosing Weight: 90 kg  Vital Signs: Temp: 97.7 F (36.5 C) (08/13 0500) Temp Source: Oral (08/13 0500) BP: 133/81 (08/13 0500) Pulse Rate: 66 (08/13 0500)  Labs: Recent Labs    10/27/20 1224 10/27/20 1224 10/27/20 1754 10/28/20 0441 10/28/20 1340 10/28/20 2138 10/29/20 0509  HGB 14.5  --   --  12.8  --   --  13.6  HCT 44.2  --   --  40.0  --   --  42.2  PLT 277  --   --  216  --   --  269  APTT  --    < > 29 57* 83* 101* 96*  HEPARINUNFRC  --   --  >1.10* >1.10*  --   --  >1.10*  CREATININE 1.31*  --   --  1.30*  --   --  1.10*   < > = values in this interval not displayed.    Estimated Creatinine Clearance: 58.6 mL/min (A) (by C-G formula based on SCr of 1.1 mg/dL (H)).  Assessment: 75 yo female with SBO vs enteritis with surgery managing. Patient on apixaban PTA for hx of recurrent PE to transition to IV heparin for time being as inpatient. Baseline labs obtained. Heparin level supratherapeutic as expected due to apixaban and aPTT WNL as expected.   Last dose of apixaban was 8/11 at 7a, therefore, heparin levels are falsely elevated so will monitor using aPTT for now.  10/29/2020 Pt is being transitioned back to home dose of apixaban  Goal of Therapy:  Heparin level 0.3-0.7 units/ml aPTT 66-102 seconds Monitor platelets by anticoagulation protocol: Yes   Plan:  Stop  heparin and start apixaban 5 mg PO BID  Monitor CBC, Scr  Monitor for signs and symptoms of bleeding     Royetta Asal, PharmD, BCPS 10/29/2020 9:52 AM

## 2020-10-29 NOTE — Progress Notes (Signed)
Patient not given dinner tray last PM Vomited clear liquids this AM.  Feels like her acid reflux is "acting up" Eulogio Bear DO

## 2020-10-29 NOTE — Progress Notes (Signed)
Progress Note    AERICA Hammond  [id number] DOB: 1945/06/04  DOA: 10/27/2020 PCP: Deland Pretty, MD    Brief Narrative:     Medical records reviewed and are as summarized below:  Shannon Hammond is an 75 y.o. 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  2 other family members sick as well.    Assessment/Plan:   Principal Problem:   Abdominal pain Active Problems:   Subacute massive pulmonary embolism (HCC)   Paroxysmal A-fib (HCC)   CKD (chronic kidney disease) stage 4, GFR 15-29 ml/min (HCC)   Enteritis   Nausea vomiting diarrhea -- prob viral as family is also sick GI pathogen panel ordered.  General surgery consult: Xray shows dilated loops of small bowel but gas is in the colon -ambulate -advance diet as tolerated    Recurrent pulmonary embolism with history of submassive PE:  -resume eliquis -no plans for surgery   Acute kidney injury:  -No recent comparison available. -Cr improved   Paroxysmal A. fib: Currently sinus rhythm.  Rate controlled on Cardizem and metoprolol.   -Eliquis resumed   GERD:  -On PPI   Hypothyroidism:  - Synthroid.  Continue.   COPD: Stable.  Steroid inhalers and nebulizer as needed.    hypomagnesemia -replete    Family Communication/Anticipated D/C date and plan/Code Status   DVT prophylaxis: heparin Code Status: Full Code.   Disposition Plan: Status is: Inpatient  Remains inpatient appropriate because:Inpatient level of care appropriate due to severity of illness  Dispo: The patient is from: Home              Anticipated d/c is to: Home              Patient currently is not medically stable to d/c.   Difficult to place patient No          Medical Consultants:   General surgery    Subjective:   Did not get a dinner tray last PM  Objective:    Vitals:   10/28/20 1801 10/28/20 2200 10/29/20 0500 10/29/20 0900  BP: 108/60 (!) 150/76 133/81   Pulse: 61 78 66   Resp: '18 16 16   '$ Temp: 97.8 F (36.6 C) 98.2 F (36.8 C) 97.7 F (36.5 C)   TempSrc: Oral Oral Oral   SpO2: 93% 92% 97%   Weight:    127.6 kg  Height:    '5\' 6"'$  (1.676 m)    Intake/Output Summary (Last 24 hours) at 10/29/2020 1153 Last data filed at 10/29/2020 1000 Gross per 24 hour  Intake 1854.15 ml  Output 1200 ml  Net 654.15 ml   Filed Weights   10/29/20 0900  Weight: 127.6 kg    Exam:  General: Appearance:    Severely obese female in no acute distress  Eyes:    PERRL, conjunctiva/corneas clear, EOM's intact        Abdomen obese  Heart:    Normal heart rate.  MS:   All extremities are intact.    Neurologic:   Awake, alert, oriented x 3. No apparent focal neurological           defect.      Data Reviewed:   I have personally reviewed following labs and  imaging studies:  Labs: Labs show the following:   Basic Metabolic Panel: Recent Labs  Lab 10/27/20 1224 10/28/20 0441 10/29/20 0509  NA 141 141 140  K 3.8 3.6 3.6  CL 109 109 106  CO2 '24 24 25  '$ GLUCOSE 113* 105* 114*  BUN '16 15 13  '$ CREATININE 1.31* 1.30* 1.10*  CALCIUM 9.4 8.8* 9.2  MG  --  1.5* 2.2  PHOS  --  3.1  --    GFR Estimated Creatinine Clearance: 58.6 mL/min (A) (by C-G formula based on SCr of 1.1 mg/dL (H)). Liver Function Tests: Recent Labs  Lab 10/27/20 1224  AST 35  ALT 41  ALKPHOS 173*  BILITOT 0.7  PROT 7.3  ALBUMIN 3.7   Recent Labs  Lab 10/27/20 1224  LIPASE 24   No results for input(s): AMMONIA in the last 168 hours. Coagulation profile No results for input(s): INR, PROTIME in the last 168 hours.  CBC: Recent Labs  Lab 10/27/20 1224 10/28/20 0441 10/29/20 0509  WBC 9.1 9.1 9.4  HGB 14.5 12.8 13.6  HCT 44.2 40.0 42.2  MCV  89.7 90.9 90.6  PLT 277 216 269   Cardiac Enzymes: No results for input(s): CKTOTAL, CKMB, CKMBINDEX, TROPONINI in the last 168 hours. BNP (last 3 results) No results for input(s): PROBNP in the last 8760 hours. CBG: No results for input(s): GLUCAP in the last 168 hours. D-Dimer: No results for input(s): DDIMER in the last 72 hours. Hgb A1c: No results for input(s): HGBA1C in the last 72 hours. Lipid Profile: No results for input(s): CHOL, HDL, LDLCALC, TRIG, CHOLHDL, LDLDIRECT in the last 72 hours. Thyroid function studies: No results for input(s): TSH, T4TOTAL, T3FREE, THYROIDAB in the last 72 hours.  Invalid input(s): FREET3 Anemia work up: No results for input(s): VITAMINB12, FOLATE, FERRITIN, TIBC, IRON, RETICCTPCT in the last 72 hours. Sepsis Labs: Recent Labs  Lab 10/27/20 1224 10/28/20 0441 10/29/20 0509  WBC 9.1 9.1 9.4    Microbiology Recent Results (from the past 240 hour(s))  Resp Panel by RT-PCR (Flu A&B, Covid) Nasopharyngeal Swab     Status: None   Collection Time: 10/27/20  5:04 PM   Specimen: Nasopharyngeal Swab; Nasopharyngeal(NP) swabs in vial transport medium  Result Value Ref Range Status   SARS Coronavirus 2 by RT PCR NEGATIVE NEGATIVE Final    Comment: (NOTE) SARS-CoV-2 target nucleic acids are NOT DETECTED.  The SARS-CoV-2 RNA is generally detectable in upper respiratory specimens during the acute phase of infection. The lowest concentration of SARS-CoV-2 viral copies this assay can detect is 138 copies/mL. A negative result does not preclude SARS-Cov-2 infection and should not be used as the sole basis for treatment or other patient management decisions. A negative result may occur with  improper specimen collection/handling, submission of specimen other than nasopharyngeal swab, presence of viral mutation(s) within the areas targeted by this assay, and inadequate number of viral copies(<138 copies/mL). A negative result must be combined  with clinical observations, patient history, and epidemiological information. The expected result is Negative.  Fact Sheet for Patients:  EntrepreneurPulse.com.au  Fact Sheet for Healthcare Providers:  IncredibleEmployment.be  This test is no t yet approved or cleared by the [place] FDA and  has been authorized for detection and/or diagnosis of SARS-CoV-2 by FDA under an Emergency Use Authorization (EUA). This EUA will remain  in effect (meaning this test can be used) for the duration of the COVID-19 declaration under Section 564(b)(1) of the Act, 21 U.S.C.section 360bbb-3(b)(1), unless  the authorization is terminated  or revoked sooner.       Influenza A by PCR NEGATIVE NEGATIVE Final   Influenza B by PCR NEGATIVE NEGATIVE Final    Comment: (NOTE) The Xpert Xpress SARS-CoV-2/FLU/RSV plus assay is intended as an aid in the diagnosis of influenza from Nasopharyngeal swab specimens and should not be used as a sole basis for treatment. Nasal washings and aspirates are unacceptable for Xpert Xpress SARS-CoV-2/FLU/RSV testing.  Fact Sheet for Patients: EntrepreneurPulse.com.au  Fact Sheet for Healthcare Providers: IncredibleEmployment.be  This test is not yet approved or cleared by the [place] FDA and has been authorized for detection and/or diagnosis of SARS-CoV-2 by FDA under an Emergency Use Authorization (EUA). This EUA will remain in effect (meaning this test can be used) for the duration of the COVID-19 declaration under Section 564(b)(1) of the Act, 21 U.S.C. section 360bbb-3(b)(1), unless the authorization is terminated or revoked.  Performed at [facility], [place] [address] [place], [place] [address]     Procedures and diagnostic studies:  CT ABDOMEN PELVIS W CONTRAST  Result Date: 10/27/2020 CLINICAL DATA:  Acute generalized abdominal pain. EXAM: CT ABDOMEN AND  PELVIS WITH CONTRAST TECHNIQUE: Multidetector CT imaging of the abdomen and pelvis was performed using the standard protocol following bolus administration of intravenous contrast. CONTRAST:  41m OMNIPAQUE IOHEXOL 350 MG/ML SOLN COMPARISON:  November 13, 2012. FINDINGS: Lower chest: No acute abnormality. Hepatobiliary: No focal liver abnormality is seen. Status post cholecystectomy. No biliary dilatation. Pancreas: Unremarkable. No pancreatic ductal dilatation or surrounding inflammatory changes. Spleen: Normal in size without focal abnormality. Adrenals/Urinary Tract: Adrenal glands appear normal. Bilateral renal cysts are noted. Nonobstructive right nephrolithiasis is noted. No hydronephrosis or renal obstruction is noted. Urinary bladder is unremarkable. Stomach/Bowel: Mild gastric distention is noted. Wall thickening of the distal stomach is noted which may represent inflammation or peptic ulcer disease. No colonic dilatation is noted. Dilated small bowel loops are noted in the right lower quadrant which may represent ileus, but distal small bowel obstruction cannot be excluded. The appendix is unremarkable. Vascular/Lymphatic: Aortic atherosclerosis. No enlarged abdominal or pelvic lymph nodes. Reproductive: Probable calcified uterine fibroid is noted. No adnexal abnormality is noted. Other: Moderate size fat containing periumbilical hernia is noted. No ascites is noted. Musculoskeletal: No acute or significant osseous findings. IMPRESSION: Dilated small bowel loops are noted in the right lower quadrant which may represent ileus, but distal small bowel obstruction cannot be excluded. Mild gastric distention is noted with wall thickening of the distal stomach suggesting possible inflammation or peptic ulcer disease. Nonobstructive right nephrolithiasis is noted. Moderate size fat containing periumbilical hernia. Aortic Atherosclerosis (ICD10-I70.0). Electronically Signed   By: JMarijo ConceptionM.D.   On:  10/27/2020 15:18   DG Abd Portable 1V  Result Date: 10/28/2020 CLINICAL DATA:  Enteritis. EXAM: PORTABLE ABDOMEN - 1 VIEW COMPARISON:  Abdominopelvic CT 10/27/2020. FINDINGS: Three supine views of the abdomen again demonstrate mild asymmetric small bowel dilatation in the right abdomen. There is some gas within the colon which remains relatively decompressed. No supine evidence of free intraperitoneal air or bowel wall thickening. Cholecystectomy clips and mild lumbar spine degenerative changes are noted. IMPRESSION: Stable mild asymmetric small bowel dilatation in the right mid abdomen in keeping with enteritis or ileus. Early/low-grade small-bowel obstruction remains a potential consideration. Electronically Signed   By: WRichardean SaleM.D.   On: 10/28/2020 08:11    Medications:    (feeding supplement) PROSource Plus  30 mL Oral  BID BM   apixaban  5 mg Oral BID   diltiazem  120 mg Oral BID   feeding supplement  1 Container Oral BID BM   levothyroxine  88 mcg Oral Q0600   metoprolol tartrate  12.5 mg Oral BID   mometasone-formoterol  2 puff Inhalation BID   montelukast  10 mg Oral QHS   multivitamin with minerals  1 tablet Oral Daily   pantoprazole  40 mg Oral BID AC   Continuous Infusions:     LOS: 1 day   Geradine Girt  [facility]   How to contact the [facility] Attending or Consulting provider [place] or covering provider during after hours [place], for this patient?  Check the care team in [facility] and look for a) attending/consulting [facility] provider listed and b) the [facility] team listed Log into [email or web address] and use [place]'s universal password to access. If you do not have the password, please contact the hospital operator. Locate the [facility] provider you are looking for under [facility] and page to a number that you can be directly reached. If you still have difficulty reaching the provider, please page the [facility] (Director on Call) for the Hospitalists listed on amion for  assistance.  10/29/2020, 11:53 AM

## 2020-10-29 NOTE — Progress Notes (Signed)
Episode of nausea and vomiting at  0337.  Approximately 300 cc .  IV Zofran given.

## 2020-10-30 LAB — CBC
HCT: 40.8 % (ref 36.0–46.0)
Hemoglobin: 13.5 g/dL (ref 12.0–15.0)
MCH: 29.8 pg (ref 26.0–34.0)
MCHC: 33.1 g/dL (ref 30.0–36.0)
MCV: 90.1 fL (ref 80.0–100.0)
Platelets: 274 10*3/uL (ref 150–400)
RBC: 4.53 MIL/uL (ref 3.87–5.11)
RDW: 15.5 % (ref 11.5–15.5)
WBC: 10.2 10*3/uL (ref 4.0–10.5)
nRBC: 0 % (ref 0.0–0.2)

## 2020-10-30 MED ORDER — FUROSEMIDE 20 MG PO TABS: 20.0000 mg | ORAL_TABLET | Freq: Every day | ORAL | Status: DC | PRN

## 2020-10-30 MED ORDER — PANTOPRAZOLE SODIUM 40 MG PO TBEC: 40.0000 mg | DELAYED_RELEASE_TABLET | Freq: Two times a day (BID) | ORAL | Status: DC

## 2020-10-30 MED ORDER — APIXABAN 5 MG PO TABS: 5.0000 mg | ORAL_TABLET | Freq: Two times a day (BID) | ORAL | Status: AC

## 2020-10-30 MED ORDER — ONDANSETRON 4 MG PO TBDP: 4.0000 mg | ORAL_TABLET | Freq: Three times a day (TID) | ORAL | 0 refills | Status: AC | PRN

## 2020-10-30 MED ORDER — ALUM & MAG HYDROXIDE-SIMETH 200-200-20 MG/5ML PO SUSP: 15.0000 mL | Freq: Four times a day (QID) | ORAL | 0 refills | Status: DC | PRN

## 2020-10-30 NOTE — Progress Notes (Signed)
Discharge instructions discussed with patient, verbalized agreement and understanding 

## 2020-10-30 NOTE — Progress Notes (Signed)
   Subjective/Chief Complaint: Passing significant flatus; no BM No further nausea or vomiting Complaining of heartburn - improved with Maalox No abdominal pain or distention   Objective: Vital signs in last 24 hours: Temp:  [98 F (36.7 C)-98.2 F (36.8 C)] 98.2 F (36.8 C) (08/14 0555) Pulse Rate:  [67-74] 67 (08/14 0555) Resp:  [18] 18 (08/14 0555) BP: (133-153)/(66-90) 133/66 (08/14 0555) SpO2:  [90 %-94 %] 93 % (08/14 0555) Weight:  [127.6 kg] 127.6 kg (08/13 0900) Last BM Date: 10/27/20  Intake/Output from previous day: 08/13 0701 - 08/14 0700 In: 1120 [P.O.:1120] Out: 1000 [Urine:1000] Intake/Output this shift: No intake/output data recorded. Awake, alert Abd - obese, soft, non-tender  Lab Results:  Recent Labs    10/29/20 0509 10/30/20 0439  WBC 9.4 10.2  HGB 13.6 13.5  HCT 42.2 40.8  PLT 269 274   BMET Recent Labs    10/28/20 0441 10/29/20 0509  NA 141 140  K 3.6 3.6  CL 109 106  CO2 24 25  GLUCOSE 105* 114*  BUN 15 13  CREATININE 1.30* 1.10*  CALCIUM 8.8* 9.2   PT/INR No results for input(s): LABPROT, INR in the last 72 hours. ABG No results for input(s): PHART, HCO3 in the last 72 hours.  Invalid input(s): PCO2, PO2  Studies/Results: No results found.  Anti-infectives: Anti-infectives (From admission, onward)    None       Assessment/Plan: Enteritis resulting in ileus   Clears - advance as tolerated Maalox PRN for heartburn No surgical indications at this time.  LOS: 2 days    Shannon Hammond 10/30/2020

## 2020-10-30 NOTE — Discharge Summary (Signed)
Physician Discharge Summary  Shannon Hammond [id number] DOB: [birth year] DOA: 10/27/2020  PCP: Deland Pretty, MD  Admit date: 10/27/2020 Discharge date: 10/30/2020  Admitted From: home Discharge disposition: home   Recommendations for Outpatient Follow-Up:   Slowly advance diet   Discharge Diagnosis:   Principal Problem:   Abdominal pain Active Problems:   Subacute massive pulmonary embolism (HCC)   Paroxysmal A-fib (HCC)   CKD (chronic kidney disease) stage 4, GFR 15-29 ml/min ([facility])   Enteritis    Discharge Condition: Improved.  Diet recommendation: advance diet  Wound care: None.  Code status: Full.   History of Present Illness:   Shannon Hammond is a 75 y.o. 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  According to the patient she started with diffuse abdominal pain and had multiple episodes of watery diarrhea since Monday.  Pain is mostly in the upper abdomen that is burning in quality, spreads across the upper abdomen.  She also has increased flatulence. Feels cold.  Stool is mostly watery no blood no mucus on it.  No fever.  No urinary symptoms.   Reportedly her daughter also had similar diarrhea that improved.   Had some improvement with Imodium 2 days ago but again recurred. She had 4 small liquidy stool today morning before arriving to the emergency room. Pain improved with morphine injection. Taking sips of water. Daughter has similar symptoms.  They live together. ED Course: At the emergency room afebrile.  Blood pressure is stable.  Creatinine 1.3.  CT scan consistent with distended small bowel loops at the right lower quadrant, distended stomach.  Patient with ongoing diarrhea.   Surgery evaluated the patient in the emergency room and treating as ileus with follow-up.  Hospitalist team consulted for admission to the hospital with surgical follow-up.   Hospital Course by Problem:   Nausea vomiting diarrhea -- prob viral as family is also sick General surgery consult: Xray shows dilated loops of small bowel but gas is in the colon -ambulate -advance diet as tolerated    Recurrent pulmonary embolism with history of submassive PE:  -resume eliquis -no plans for surgery   Acute kidney injury:  -No recent comparison available. -Cr improved with IVF   Paroxysmal A. fib: Currently sinus rhythm.  Rate controlled on Cardizem and metoprolol.   -Eliquis resumed   GERD:  -On PPI   Hypothyroidism:  - Synthroid.  Continue.   COPD: Stable.  Steroid inhalers and nebulizer as needed.    hypomagnesemia -repleted      Medical Consultants:   GS   Discharge Exam:   Vitals:   10/30/20 0555 10/30/20 0855  BP: 133/66   Pulse: 67   Resp: 18   Temp: 98.2 F (36.8 C)   SpO2: 93% 91%   Vitals:   10/29/20 1928 10/29/20 2135 10/30/20 0555 10/30/20 0855  BP:  (!) 153/90 133/66   Pulse:  74 67   Resp:  18 18   Temp:  98 F (36.7 C) 98.2 F (36.8 C)   TempSrc:  Oral Oral   SpO2: 94% 90% 93% 91%  Weight:      Height:        General exam: Appears calm and comfortable.  The results of significant diagnostics from this hospitalization (including imaging, microbiology, ancillary and laboratory) are listed below for reference.     Procedures and Diagnostic Studies:   CT ABDOMEN PELVIS W CONTRAST  Result Date: 10/27/2020 CLINICAL DATA:  Acute generalized abdominal pain. EXAM: CT ABDOMEN AND PELVIS WITH CONTRAST TECHNIQUE: Multidetector CT imaging of the abdomen and pelvis was performed using the standard protocol following bolus administration of intravenous contrast. CONTRAST:  73m OMNIPAQUE IOHEXOL 350 MG/ML SOLN COMPARISON:  November 13, 2012. FINDINGS:  Lower chest: No acute abnormality. Hepatobiliary: No focal liver abnormality is seen. Status post cholecystectomy. No biliary dilatation. Pancreas: Unremarkable. No pancreatic ductal dilatation or surrounding inflammatory changes. Spleen: Normal in size without focal abnormality. Adrenals/Urinary Tract: Adrenal glands appear normal. Bilateral renal cysts are noted. Nonobstructive right nephrolithiasis is noted. No hydronephrosis or renal obstruction is noted. Urinary bladder is unremarkable. Stomach/Bowel: Mild gastric distention is noted. Wall thickening of the distal stomach is noted which may represent inflammation or peptic ulcer disease. No colonic dilatation is noted. Dilated small bowel loops are noted in the right lower quadrant which may represent ileus, but distal small bowel obstruction cannot be excluded. The appendix is unremarkable. Vascular/Lymphatic: Aortic atherosclerosis. No enlarged abdominal or pelvic lymph nodes. Reproductive: Probable calcified uterine fibroid is noted. No adnexal abnormality is noted. Other: Moderate size fat containing periumbilical hernia is noted. No ascites is noted. Musculoskeletal: No acute or significant osseous findings. IMPRESSION: Dilated small bowel loops are noted in the right lower quadrant which may represent ileus, but distal small bowel obstruction cannot be excluded. Mild gastric distention is noted with wall thickening of the distal stomach suggesting possible inflammation or peptic ulcer disease. Nonobstructive right nephrolithiasis is noted. Moderate size fat containing periumbilical hernia. Aortic Atherosclerosis (ICD10-I70.0). Electronically Signed   By: JMarijo ConceptionM.D.   On: 10/27/2020 15:18   DG Abd Portable 1V  Result Date: 10/28/2020 CLINICAL DATA:  Enteritis. EXAM: PORTABLE ABDOMEN - 1 VIEW COMPARISON:  Abdominopelvic CT 10/27/2020. FINDINGS: Three supine views of the abdomen again demonstrate mild asymmetric small bowel dilatation in the  right abdomen. There is some gas within the colon which remains relatively decompressed. No supine evidence of free intraperitoneal air or bowel wall thickening. Cholecystectomy clips and mild lumbar spine degenerative changes are noted. IMPRESSION: Stable mild asymmetric small bowel dilatation in the right mid abdomen in keeping with enteritis or ileus. Early/low-grade small-bowel obstruction remains a potential consideration. Electronically Signed   By: WRichardean SaleM.D.   On: 10/28/2020 08:11     Labs:   Basic Metabolic Panel: Recent Labs  Lab 10/27/20 1224 10/28/20 0441 10/29/20 0509  NA 141 141 140  K 3.8 3.6 3.6  CL 109 109 106  CO2 '24 24 25  '$ GLUCOSE 113* 105* 114*  BUN '16 15 13  '$ CREATININE 1.31* 1.30* 1.10*  CALCIUM 9.4 8.8* 9.2  MG  --  1.5* 2.2  PHOS  --  3.1  --    GFR Estimated Creatinine Clearance: 58.6 mL/min (A) (by C-G formula based on SCr of 1.1 mg/dL (H)). Liver Function Tests: Recent Labs  Lab 10/27/20 1224  AST 35  ALT 41  ALKPHOS 173*  BILITOT 0.7  PROT 7.3  ALBUMIN 3.7   Recent Labs  Lab 10/27/20 1224  LIPASE 24   No results for input(s): AMMONIA in the last 168 hours. Coagulation profile No results for input(s): INR, PROTIME in the last 168 hours.  CBC: Recent Labs  Lab 10/27/20 1224 10/28/20  [id number] 10/29/20 0509 10/30/20 0439  WBC 9.1 9.1 9.4 10.2  HGB 14.5 12.8 13.6 13.5  HCT 44.2 40.0 42.2 40.8  MCV 89.7 90.9 90.6 90.1  PLT 277 216 269 274   Cardiac Enzymes: No results for input(s): CKTOTAL, CKMB, CKMBINDEX, TROPONINI in the last 168 hours. BNP: Invalid input(s): POCBNP CBG: No results for input(s): GLUCAP in the last 168 hours. D-Dimer No results for input(s): DDIMER in the last 72 hours. Hgb A1c No results for input(s): HGBA1C in the last 72 hours. Lipid Profile No results for input(s): CHOL, HDL, LDLCALC, TRIG, CHOLHDL, LDLDIRECT in the last 72 hours. Thyroid function studies No results for input(s): TSH, T4TOTAL,  T3FREE, THYROIDAB in the last 72 hours.  Invalid input(s): FREET3 Anemia work up No results for input(s): VITAMINB12, FOLATE, FERRITIN, TIBC, IRON, RETICCTPCT in the last 72 hours. Microbiology Recent Results (from the past 240 hour(s))  Resp Panel by RT-PCR (Flu A&B, Covid) Nasopharyngeal Swab     Status: None   Collection Time: 10/27/20  5:04 PM   Specimen: Nasopharyngeal Swab; Nasopharyngeal(NP) swabs in vial transport medium  Result Value Ref Range Status   SARS Coronavirus 2 by RT PCR NEGATIVE NEGATIVE Final    Comment: (NOTE) SARS-CoV-2 target nucleic acids are NOT DETECTED.  The SARS-CoV-2 RNA is generally detectable in upper respiratory specimens during the acute phase of infection. The lowest concentration of SARS-CoV-2 viral copies this assay can detect is 138 copies/mL. A negative result does not preclude SARS-Cov-2 infection and should not be used as the sole basis for treatment or other patient management decisions. A negative result may occur with  improper specimen collection/handling, submission of specimen other than nasopharyngeal swab, presence of viral mutation(s) within the areas targeted by this assay, and inadequate number of viral copies(<138 copies/mL). A negative result must be combined with clinical observations, patient history, and epidemiological information. The expected result is Negative.  Fact Sheet for Patients:  EntrepreneurPulse.com.au  Fact Sheet for Healthcare Providers:  IncredibleEmployment.be  This test is no t yet approved or cleared by the [place] FDA and  has been authorized for detection and/or diagnosis of SARS-CoV-2 by FDA under an Emergency Use Authorization (EUA). This EUA will remain  in effect (meaning this test can be used) for the duration of the COVID-19 declaration under Section 564(b)(1) of the Act, 21 U.S.C.section 360bbb-3(b)(1), unless the authorization is terminated  or  revoked sooner.       Influenza A by PCR NEGATIVE NEGATIVE Final   Influenza B by PCR NEGATIVE NEGATIVE Final    Comment: (NOTE) The Xpert Xpress SARS-CoV-2/FLU/RSV plus assay is intended as an aid in the diagnosis of influenza from Nasopharyngeal swab specimens and should not be used as a sole basis for treatment. Nasal washings and aspirates are unacceptable for Xpert Xpress SARS-CoV-2/FLU/RSV testing.  Fact Sheet for Patients: EntrepreneurPulse.com.au  Fact Sheet for Healthcare Providers: IncredibleEmployment.be  This test is not yet approved or cleared by the [place] FDA and has been authorized for detection and/or diagnosis of SARS-CoV-2 by FDA under an Emergency Use Authorization (EUA). This EUA will remain in effect (meaning this test can be used) for the duration of the COVID-19 declaration under Section 564(b)(1) of the Act, 21 U.S.C. section 360bbb-3(b)(1), unless the authorization is terminated or revoked.  Performed at [facility], [place] [address], [place], [place] [address]      Discharge Instructions:   Discharge Instructions     Discharge instructions   Complete by:  As directed    Liquid diet/bland food- advance as tolerated   Increase activity slowly   Complete by: As directed       Allergies as of 10/30/2020       Reactions   Myrbetriq [mirabegron]         Medication List     TAKE these medications    acetaminophen 325 MG tablet Commonly known as: TYLENOL   alum & mag hydroxide-simeth [id number] MG/5ML suspension Commonly known as: MAALOX/MYLANTA Take 15 mLs by mouth every 6 (six) hours as needed for indigestion or heartburn.   apixaban 5 MG Tabs tablet Commonly known as: ELIQUIS Take 1 tablet (5 mg total) by mouth 2 (two) times daily.   B-complex with vitamin C tablet Take 1 tablet by mouth daily.   budesonide-formoterol 160-4.5 MCG/ACT inhaler Commonly known as:  SYMBICORT Inhale 2 puffs into the lungs 2 (two) times daily.   diltiazem 120 MG 12 hr capsule Commonly known as: CARDIZEM SR Take 1 capsule (120 mg total) by mouth 2 (two) times daily.   furosemide 20 MG tablet Commonly known as: LASIX Take 1 tablet (20 mg total) by mouth daily as needed for edema or fluid. What changed:  when to take this reasons to take this   levothyroxine 88 MCG tablet Commonly known as: SYNTHROID Take 88 mcg by mouth daily before breakfast.   magnesium oxide 400 MG tablet Commonly known as: MAG-OX Take 400 mg by mouth daily.   metoprolol tartrate 50 MG tablet Commonly known as: LOPRESSOR Take 12.5 mg by mouth 2 (two) times daily.   montelukast 10 MG tablet Commonly known as: SINGULAIR Take 10 mg by mouth at bedtime.   omeprazole 40 MG capsule Commonly known as: PRILOSEC Take 40 mg by mouth daily.   ondansetron 4 MG disintegrating tablet Commonly known as: Zofran ODT Take 1 tablet (4 mg total) by mouth every 8 (eight) hours as needed for nausea or vomiting.   trimethoprim 100 MG tablet Commonly known as: TRIMPEX Take 100 mg by mouth daily.   Ventolin HFA 108 (90 Base) MCG/ACT inhaler Generic drug: albuterol Inhale 2 puffs into the lungs every 4 (four) hours as needed for shortness of breath.   VITAMIN D3 PO Take 2,000 Units by mouth daily.        Follow-up Information     Deland Pretty, MD Follow up in 1 week(s).   Specialty: Internal Medicine Contact information: [address] [place] [address] [phone]                  Time coordinating discharge: 35 min  Signed:  Geradine Girt DO  [facility] 10/30/2020, 10:24 AM

## 2020-11-07 DIAGNOSIS — I10 Essential (primary) hypertension: Secondary | ICD-10-CM | POA: Diagnosis not present

## 2020-11-07 DIAGNOSIS — M25561 Pain in right knee: Secondary | ICD-10-CM | POA: Diagnosis not present

## 2020-11-07 DIAGNOSIS — M25562 Pain in left knee: Secondary | ICD-10-CM | POA: Diagnosis not present

## 2020-11-07 DIAGNOSIS — R0602 Shortness of breath: Secondary | ICD-10-CM | POA: Diagnosis not present

## 2020-11-07 DIAGNOSIS — I420 Dilated cardiomyopathy: Secondary | ICD-10-CM | POA: Diagnosis not present

## 2020-11-07 DIAGNOSIS — E034 Atrophy of thyroid (acquired): Secondary | ICD-10-CM | POA: Diagnosis not present

## 2020-11-07 DIAGNOSIS — R072 Precordial pain: Secondary | ICD-10-CM | POA: Diagnosis not present

## 2020-11-07 DIAGNOSIS — Z86711 Personal history of pulmonary embolism: Secondary | ICD-10-CM | POA: Diagnosis not present

## 2020-11-24 DIAGNOSIS — R197 Diarrhea, unspecified: Secondary | ICD-10-CM | POA: Diagnosis not present

## 2020-11-24 DIAGNOSIS — K567 Ileus, unspecified: Secondary | ICD-10-CM | POA: Diagnosis not present

## 2020-12-07 DIAGNOSIS — K219 Gastro-esophageal reflux disease without esophagitis: Secondary | ICD-10-CM | POA: Diagnosis not present

## 2020-12-07 DIAGNOSIS — K567 Ileus, unspecified: Secondary | ICD-10-CM | POA: Diagnosis not present

## 2020-12-08 ENCOUNTER — Other Ambulatory Visit: Payer: Self-pay | Admitting: Physician Assistant

## 2020-12-08 DIAGNOSIS — K219 Gastro-esophageal reflux disease without esophagitis: Secondary | ICD-10-CM

## 2020-12-15 ENCOUNTER — Ambulatory Visit
Admission: RE | Admit: 2020-12-15 | Discharge: 2020-12-15 | Disposition: A | Discharge status: Home / Self Care | Payer: Medicare HMO | Source: Ambulatory Visit | Attending: Physician Assistant | Admitting: Physician Assistant

## 2020-12-15 DIAGNOSIS — R1013 Epigastric pain: Secondary | ICD-10-CM | POA: Diagnosis not present

## 2020-12-15 DIAGNOSIS — K449 Diaphragmatic hernia without obstruction or gangrene: Secondary | ICD-10-CM | POA: Diagnosis not present

## 2020-12-15 DIAGNOSIS — K219 Gastro-esophageal reflux disease without esophagitis: Secondary | ICD-10-CM

## 2021-01-09 DIAGNOSIS — E039 Hypothyroidism, unspecified: Secondary | ICD-10-CM | POA: Diagnosis not present

## 2021-01-09 DIAGNOSIS — E7800 Pure hypercholesterolemia, unspecified: Secondary | ICD-10-CM | POA: Diagnosis not present

## 2021-01-09 DIAGNOSIS — I10 Essential (primary) hypertension: Secondary | ICD-10-CM | POA: Diagnosis not present

## 2021-01-12 DIAGNOSIS — J45909 Unspecified asthma, uncomplicated: Secondary | ICD-10-CM | POA: Diagnosis not present

## 2021-01-12 DIAGNOSIS — I519 Heart disease, unspecified: Secondary | ICD-10-CM | POA: Diagnosis not present

## 2021-01-12 DIAGNOSIS — Z0000 Encounter for general adult medical examination without abnormal findings: Secondary | ICD-10-CM | POA: Diagnosis not present

## 2021-01-12 DIAGNOSIS — K219 Gastro-esophageal reflux disease without esophagitis: Secondary | ICD-10-CM | POA: Diagnosis not present

## 2021-01-12 DIAGNOSIS — Z86711 Personal history of pulmonary embolism: Secondary | ICD-10-CM | POA: Diagnosis not present

## 2021-01-12 DIAGNOSIS — N1832 Chronic kidney disease, stage 3b: Secondary | ICD-10-CM | POA: Diagnosis not present

## 2021-01-12 DIAGNOSIS — I10 Essential (primary) hypertension: Secondary | ICD-10-CM | POA: Diagnosis not present

## 2021-01-12 DIAGNOSIS — Z23 Encounter for immunization: Secondary | ICD-10-CM | POA: Diagnosis not present

## 2021-01-12 DIAGNOSIS — E039 Hypothyroidism, unspecified: Secondary | ICD-10-CM | POA: Diagnosis not present

## 2021-01-16 DIAGNOSIS — E7800 Pure hypercholesterolemia, unspecified: Secondary | ICD-10-CM | POA: Diagnosis not present

## 2021-01-16 DIAGNOSIS — I130 Hypertensive heart and chronic kidney disease with heart failure and stage 1 through stage 4 chronic kidney disease, or unspecified chronic kidney disease: Secondary | ICD-10-CM | POA: Diagnosis not present

## 2021-01-16 DIAGNOSIS — N1832 Chronic kidney disease, stage 3b: Secondary | ICD-10-CM | POA: Diagnosis not present

## 2021-01-16 DIAGNOSIS — I519 Heart disease, unspecified: Secondary | ICD-10-CM | POA: Diagnosis not present

## 2021-01-26 DIAGNOSIS — Z1231 Encounter for screening mammogram for malignant neoplasm of breast: Secondary | ICD-10-CM | POA: Diagnosis not present

## 2021-03-06 ENCOUNTER — Ambulatory Visit (INDEPENDENT_AMBULATORY_CARE_PROVIDER_SITE_OTHER): Payer: Medicare HMO | Admitting: Physician Assistant

## 2021-03-06 ENCOUNTER — Encounter: Payer: Self-pay | Admitting: Physician Assistant

## 2021-03-06 DIAGNOSIS — M170 Bilateral primary osteoarthritis of knee: Secondary | ICD-10-CM

## 2021-03-06 DIAGNOSIS — M1712 Unilateral primary osteoarthritis, left knee: Secondary | ICD-10-CM

## 2021-03-06 DIAGNOSIS — M1711 Unilateral primary osteoarthritis, right knee: Secondary | ICD-10-CM | POA: Diagnosis not present

## 2021-03-06 MED ORDER — LIDOCAINE HCL 1 % IJ SOLN
3.0000 mL | INTRAMUSCULAR | Status: AC | PRN
  Administered 2021-03-06: 10:00:00 3 mL

## 2021-03-06 MED ORDER — METHYLPREDNISOLONE ACETATE 40 MG/ML IJ SUSP
40.0000 mg | INTRAMUSCULAR | Status: AC | PRN
  Administered 2021-03-06: 10:00:00 40 mg via INTRA_ARTICULAR

## 2021-03-06 NOTE — Progress Notes (Signed)
Office Visit Note   Patient: Shannon Hammond           Date of Birth: 1945-07-31           MRN: [id number] Visit Date: 03/06/2021              Requested by: Deland Pretty, MD [address] [place],  [place] [address] PCP: Deland Pretty, MD   Assessment & Plan: Visit Diagnoses:  1. Primary osteoarthritis of left knee   2. Primary osteoarthritis of right knee     Plan: She understands to wait at least 3 months between injections.  She will work on Forensic scientist.  Follow-up with [place] as needed.  Questions encouraged and answered  Follow-Up Instructions: Return if symptoms worsen or fail to improve.   Orders:  Orders Placed This Encounter  Procedures   Large Joint Inj: bilateral knee   No orders of the defined types were placed in this encounter.     Procedures: Large Joint Inj: bilateral knee on 03/06/2021 9:41 AM Indications: pain Details: 22 G 1.5 in needle, anterolateral approach  Arthrogram: No  Medications (Right): 3 mL lidocaine 1 %; 40 mg methylPREDNISolone acetate 40 MG/ML Medications (Left): 3 mL lidocaine 1 %; 40 mg methylPREDNISolone acetate 40 MG/ML Outcome: tolerated well, no immediate complications Procedure, treatment alternatives, risks and benefits explained, specific risks discussed. Consent was given by the patient. Immediately prior to procedure a time out was called to verify the correct patient, procedure, equipment, support staff and site/side marked as required. Patient was prepped and draped in the usual sterile fashion.      Clinical Data: No additional findings.   Subjective: Chief Complaint  Patient presents with   Left Knee - Pain, Follow-up    HPI Shannon Hammond comes in today with bilateral knee pain.  She has known osteoarthritis both knees.  She was last seen in April of this year and was given a left knee cortisone injection.  She states the injection helped.  She states that around Thanksgiving the knee pain on  the left became more frequent.  Is gotten worse over the last 2 weeks.  She is having problems getting up and down.  She is having some discomfort in the right knee.  Again she is on chronic anticoagulation due to history of DVT right leg.  Regards to her knees she has tried knee brace on the left tried ice.  Patient is nondiabetic.  Review of Systems Denies any fevers or chills.  No recent vaccines.  Objective: Vital Signs: There were no vitals taken for this visit.  Physical Exam Constitutional:      Appearance: She is not ill-appearing or diaphoretic.  Pulmonary:     Effort: Pulmonary effort is normal.  Neurological:     Mental Status: She is alert.  Psychiatric:        Behavior: Behavior normal.    Ortho Exam Bilateral knees she has full extension flexion 90 degrees.  No abnormal warmth erythema or effusion she has tenderness over the lateral joint line of both knees. Specialty Comments:  No specialty comments available.  Imaging: No results found.   PMFS History: Patient Active Problem List   Diagnosis Date Noted   Enteritis 10/28/2020   Abdominal pain 10/27/2020   RVF (right ventricular failure) ([place]) 03/02/2016   Morbid obesity ([facility]) 03/02/2016   Right leg DVT ([place]) 03/02/2016   CKD (chronic kidney disease) stage 4, GFR 15-29 ml/min (HCC) 03/02/2016   Subacute  massive pulmonary embolism ([place]) 02/29/2016   Paroxysmal A-fib (HCC) 02/29/2016   Chest pain 11/03/2014   Dyspnea 11/02/2014   Past Medical History:  Diagnosis Date   Anginal pain ([place])    Arthritis    BACK AND KNEES   DVT (deep venous thrombosis) (HCC) 03/19/2010   RIGHT LEG   GERD (gastroesophageal reflux disease)    Heart murmur    Hemorrhoids    History of kidney stones    History of nocturia    Hypertension    Hypothyroidism    PE (pulmonary embolism)    HX PE 2012 - TX'D IN [place]- HOSPITALIZED X 1 WEEK.   NO LONGER ON BLOOD THINNER OTHER THAN 81 MG ASPIRIN   Seizures ([place])  03/19/2005   ONLY ONCE - THOUGHT TO BE STRESS INDUCED - PT WAS DEALING WITH HER MOTHER'S DEATH   Shortness of breath    PT TOLD BY HER MEDICAL DOCTOR - SOME LUNG CHANGES DUE TO 2ND HAND SMOKE - SHE WAS GIVEN INHALER TO USE AS NEEDED.    Family History  Problem Relation Age of Onset   Heart attack Father    Clotting disorder Neg Hx     Past Surgical History:  Procedure Laterality Date   BREAST SURGERY  1993   CYST REMOVED LEFT BREAST   CHOLECYSTECTOMY  LATE 1990'S   CYSTOSCOPY WITH RETROGRADE PYELOGRAM, URETEROSCOPY AND STENT PLACEMENT Right 11/27/2012   Procedure: CYSTOSCOPY WITH RETROGRADE PYELOGRAM, URETEROSCOPY, stone basketry AND STENT PLACEMENT;  Surgeon: Franchot Gallo, MD;  Location: [facility];  Service: Urology;  Laterality: Right;   HOLMIUM LASER APPLICATION Right 5/59/7416   Procedure: HOLMIUM LASER APPLICATION;  Surgeon: Franchot Gallo, MD;  Location: [facility];  Service: Urology;  Laterality: Right;   LITHOTRIPSY     BOTH KIDNEYS   Social History   Occupational History   Not on file  Tobacco Use   Smoking status: Never   Smokeless tobacco: Never  Substance and Sexual Activity   Alcohol use: No   Drug use: No   Sexual activity: Not on file

## 2021-03-21 DIAGNOSIS — I420 Dilated cardiomyopathy: Secondary | ICD-10-CM | POA: Diagnosis not present

## 2021-03-21 DIAGNOSIS — E034 Atrophy of thyroid (acquired): Secondary | ICD-10-CM | POA: Diagnosis not present

## 2021-03-21 DIAGNOSIS — M25562 Pain in left knee: Secondary | ICD-10-CM | POA: Diagnosis not present

## 2021-03-21 DIAGNOSIS — M25561 Pain in right knee: Secondary | ICD-10-CM | POA: Diagnosis not present

## 2021-03-21 DIAGNOSIS — Z86711 Personal history of pulmonary embolism: Secondary | ICD-10-CM | POA: Diagnosis not present

## 2021-03-21 DIAGNOSIS — I10 Essential (primary) hypertension: Secondary | ICD-10-CM | POA: Diagnosis not present

## 2021-03-28 DIAGNOSIS — R351 Nocturia: Secondary | ICD-10-CM | POA: Diagnosis not present

## 2021-03-28 DIAGNOSIS — N3946 Mixed incontinence: Secondary | ICD-10-CM | POA: Diagnosis not present

## 2021-04-18 DIAGNOSIS — H10413 Chronic giant papillary conjunctivitis, bilateral: Secondary | ICD-10-CM | POA: Diagnosis not present

## 2021-04-18 DIAGNOSIS — H2513 Age-related nuclear cataract, bilateral: Secondary | ICD-10-CM | POA: Diagnosis not present

## 2021-04-18 DIAGNOSIS — H04123 Dry eye syndrome of bilateral lacrimal glands: Secondary | ICD-10-CM | POA: Diagnosis not present

## 2021-04-26 DIAGNOSIS — K219 Gastro-esophageal reflux disease without esophagitis: Secondary | ICD-10-CM | POA: Diagnosis not present

## 2021-04-26 DIAGNOSIS — R935 Abnormal findings on diagnostic imaging of other abdominal regions, including retroperitoneum: Secondary | ICD-10-CM | POA: Diagnosis not present

## 2021-07-20 DIAGNOSIS — N3944 Nocturnal enuresis: Secondary | ICD-10-CM | POA: Diagnosis not present

## 2021-07-20 DIAGNOSIS — N3946 Mixed incontinence: Secondary | ICD-10-CM | POA: Diagnosis not present

## 2021-07-25 DIAGNOSIS — I420 Dilated cardiomyopathy: Secondary | ICD-10-CM | POA: Diagnosis not present

## 2021-07-25 DIAGNOSIS — R072 Precordial pain: Secondary | ICD-10-CM | POA: Diagnosis not present

## 2021-07-25 DIAGNOSIS — M25562 Pain in left knee: Secondary | ICD-10-CM | POA: Diagnosis not present

## 2021-08-07 ENCOUNTER — Encounter: Payer: Self-pay | Admitting: Physician Assistant

## 2021-08-07 ENCOUNTER — Ambulatory Visit: Payer: Medicare HMO | Admitting: Physician Assistant

## 2021-08-07 DIAGNOSIS — M170 Bilateral primary osteoarthritis of knee: Secondary | ICD-10-CM | POA: Diagnosis not present

## 2021-08-07 DIAGNOSIS — M7061 Trochanteric bursitis, right hip: Secondary | ICD-10-CM | POA: Diagnosis not present

## 2021-08-07 DIAGNOSIS — M1711 Unilateral primary osteoarthritis, right knee: Secondary | ICD-10-CM

## 2021-08-07 DIAGNOSIS — M1712 Unilateral primary osteoarthritis, left knee: Secondary | ICD-10-CM | POA: Diagnosis not present

## 2021-08-07 MED ORDER — LIDOCAINE HCL 1 % IJ SOLN
3.0000 mL | INTRAMUSCULAR | Status: AC | PRN
Start: 2021-08-07 — End: 2021-08-07
  Administered 2021-08-07: 3 mL

## 2021-08-07 MED ORDER — LIDOCAINE HCL 1 % IJ SOLN
3.0000 mL | INTRAMUSCULAR | Status: AC | PRN
  Administered 2021-08-07: 3 mL

## 2021-08-07 MED ORDER — METHYLPREDNISOLONE ACETATE 40 MG/ML IJ SUSP
40.0000 mg | INTRAMUSCULAR | Status: AC | PRN
  Administered 2021-08-07: 40 mg via INTRA_ARTICULAR

## 2021-08-07 NOTE — Progress Notes (Signed)
   Procedure Note  Patient: Shannon Hammond             Date of Birth: 1946-01-22           MRN: [id number]             Visit Date: 08/07/2021   HPI: Ms. Rodman Key comes in today wanting both knees injected.  She was last seen 03/06/2021 and was given injections both knees which helped until about 3 weeks ago when her left knee began bothering her.  She states she has pain when walking.  She denies any injuries or falls.  She does have some pain going into her shins.  Denies any fevers or chills.  She is also having some pain right lateral hip region.  No radicular symptoms down either leg.  Review of systems see HPI otherwise negative  Physical exam: General well-developed well-nourished female in no acute distress.  She is using no assistive device to ambulate.  Bilateral lower extremity she has good range of motion of both hips.  Tenderness over the right hip trochanteric region.  Bilateral knees good range of motion of both knees.  No abnormal warmth erythema or effusion.  No gross instability with varus valgus stressing.  Tenderness over the lateral joint line of both knees.  Calves are supple nontender bilaterally.  Slight edema bilateral lower extremities.  Procedures: Visit Diagnoses:  1. Primary osteoarthritis of left knee   2. Primary osteoarthritis of right knee   3. Trochanteric bursitis, right hip     Large Joint Inj: bilateral knee on 08/07/2021 9:30 AM Indications: pain Details: 22 G 1.5 in needle, anterolateral approach  Arthrogram: No  Medications (Right): 3 mL lidocaine 1 %; 40 mg methylPREDNISolone acetate 40 MG/ML Medications (Left): 3 mL lidocaine 1 %; 40 mg methylPREDNISolone acetate 40 MG/ML Outcome: tolerated well, no immediate complications Procedure, treatment alternatives, risks and benefits explained, specific risks discussed. Consent was given by the patient. Immediately prior to procedure a time out was called to verify the correct patient, procedure,  equipment, support staff and site/side marked as required. Patient was prepped and draped in the usual sterile fashion.    Plan: Discussed with her formal therapy for IT band stretching she defers.  Therefore she shown exercises to work on IT band stretching also gave her exercises for back and core strengthening.  Regards to the knee she will continue work on range of motion and strengthening.  She understands wait at least 3 months between injections.  She did ask about a rollator prescriptions given.  Follow-up as needed.

## 2021-09-15 DIAGNOSIS — Z6841 Body Mass Index (BMI) 40.0 and over, adult: Secondary | ICD-10-CM | POA: Diagnosis not present

## 2021-09-15 DIAGNOSIS — I10 Essential (primary) hypertension: Secondary | ICD-10-CM | POA: Diagnosis not present

## 2021-09-15 DIAGNOSIS — J45909 Unspecified asthma, uncomplicated: Secondary | ICD-10-CM | POA: Diagnosis not present

## 2021-09-15 DIAGNOSIS — N1832 Chronic kidney disease, stage 3b: Secondary | ICD-10-CM | POA: Diagnosis not present

## 2021-10-02 DIAGNOSIS — M25562 Pain in left knee: Secondary | ICD-10-CM | POA: Diagnosis not present

## 2021-10-02 DIAGNOSIS — I420 Dilated cardiomyopathy: Secondary | ICD-10-CM | POA: Diagnosis not present

## 2021-10-02 DIAGNOSIS — R072 Precordial pain: Secondary | ICD-10-CM | POA: Diagnosis not present

## 2021-10-18 DIAGNOSIS — K006 Disturbances in tooth eruption: Secondary | ICD-10-CM | POA: Diagnosis not present

## 2021-10-18 DIAGNOSIS — R69 Illness, unspecified: Secondary | ICD-10-CM | POA: Diagnosis not present

## 2021-11-16 DIAGNOSIS — N1832 Chronic kidney disease, stage 3b: Secondary | ICD-10-CM | POA: Diagnosis not present

## 2021-11-16 DIAGNOSIS — Z6841 Body Mass Index (BMI) 40.0 and over, adult: Secondary | ICD-10-CM | POA: Diagnosis not present

## 2021-11-16 DIAGNOSIS — J45909 Unspecified asthma, uncomplicated: Secondary | ICD-10-CM | POA: Diagnosis not present

## 2021-11-16 DIAGNOSIS — I10 Essential (primary) hypertension: Secondary | ICD-10-CM | POA: Diagnosis not present

## 2021-11-29 ENCOUNTER — Encounter: Payer: Self-pay | Admitting: Orthopaedic Surgery

## 2021-11-29 ENCOUNTER — Ambulatory Visit: Payer: Medicare HMO | Admitting: Orthopaedic Surgery

## 2021-11-29 DIAGNOSIS — M1712 Unilateral primary osteoarthritis, left knee: Secondary | ICD-10-CM | POA: Diagnosis not present

## 2021-11-29 DIAGNOSIS — M1711 Unilateral primary osteoarthritis, right knee: Secondary | ICD-10-CM

## 2021-11-29 DIAGNOSIS — G8929 Other chronic pain: Secondary | ICD-10-CM

## 2021-11-29 DIAGNOSIS — M25562 Pain in left knee: Secondary | ICD-10-CM

## 2021-11-29 DIAGNOSIS — M25561 Pain in right knee: Secondary | ICD-10-CM | POA: Diagnosis not present

## 2021-11-29 MED ORDER — LIDOCAINE HCL 1 % IJ SOLN
3.0000 mL | INTRAMUSCULAR | Status: AC | PRN
Start: 2021-11-29 — End: 2021-11-29
  Administered 2021-11-29: 3 mL

## 2021-11-29 MED ORDER — METHYLPREDNISOLONE ACETATE 40 MG/ML IJ SUSP
40.0000 mg | INTRAMUSCULAR | Status: AC | PRN
  Administered 2021-11-29: 40 mg via INTRA_ARTICULAR

## 2021-11-29 MED ORDER — LIDOCAINE HCL 1 % IJ SOLN
3.0000 mL | INTRAMUSCULAR | Status: AC | PRN
  Administered 2021-11-29: 3 mL

## 2021-11-29 NOTE — Progress Notes (Signed)
The patient is here today requesting steroid injections in both her knees.  I have seen her in the past for this.  She has significant arthritis in both her knees.  One of her bigger complaints today though is bilateral lower extremity swelling.  She is 76 years old.  She is taking a "fluid pill" daily and that is helped some.  She used to wear compressive garments in the past.  She has had a DVT remotely.  He has developed occasionally small wounds on her legs.  Examination of both knees show painful arc of motion of both knees.  The right knee has been very painful recently and it hurts in the back of the knee and some to the posterior lateral aspect of the right knee.  Both legs have pitting edema and just slight evidence of cellulitis.  She understands she would benefit from compression garments for both her lower extremities.  We will give her a prescription for those.  I did place a steroid injection per her request in both knees which she tolerated well.  She knows to wait at least 3 months between injections.  All questions and concerns were answered and addressed.  Procedure Note  Patient: Shannon Hammond             Date of Birth: October 18, 1945           MRN: [id number]             Visit Date: 11/29/2021  Procedures: Visit Diagnoses:  1. Primary osteoarthritis of left knee   2. Primary osteoarthritis of right knee   3. Chronic pain of left knee   4. Chronic pain of right knee     Large Joint Inj: R knee on 11/29/2021 8:41 AM Indications: diagnostic evaluation and pain Details: 22 G 1.5 in needle, superolateral approach  Arthrogram: No  Medications: 3 mL lidocaine 1 %; 40 mg methylPREDNISolone acetate 40 MG/ML Outcome: tolerated well, no immediate complications Procedure, treatment alternatives, risks and benefits explained, specific risks discussed. Consent was given by the patient. Immediately prior to procedure a time out was called to verify the correct patient, procedure,  equipment, support staff and site/side marked as required. Patient was prepped and draped in the usual sterile fashion.    Large Joint Inj: L knee on 11/29/2021 8:41 AM Indications: diagnostic evaluation and pain Details: 22 G 1.5 in needle, superolateral approach  Arthrogram: No  Medications: 3 mL lidocaine 1 %; 40 mg methylPREDNISolone acetate 40 MG/ML Outcome: tolerated well, no immediate complications Procedure, treatment alternatives, risks and benefits explained, specific risks discussed. Consent was given by the patient. Immediately prior to procedure a time out was called to verify the correct patient, procedure, equipment, support staff and site/side marked as required. Patient was prepped and draped in the usual sterile fashion.

## 2021-11-30 DIAGNOSIS — I519 Heart disease, unspecified: Secondary | ICD-10-CM | POA: Diagnosis not present

## 2021-11-30 DIAGNOSIS — N1832 Chronic kidney disease, stage 3b: Secondary | ICD-10-CM | POA: Diagnosis not present

## 2021-11-30 DIAGNOSIS — R600 Localized edema: Secondary | ICD-10-CM | POA: Diagnosis not present

## 2021-11-30 DIAGNOSIS — R609 Edema, unspecified: Secondary | ICD-10-CM | POA: Diagnosis not present

## 2021-11-30 DIAGNOSIS — Z86711 Personal history of pulmonary embolism: Secondary | ICD-10-CM | POA: Diagnosis not present

## 2021-11-30 DIAGNOSIS — E039 Hypothyroidism, unspecified: Secondary | ICD-10-CM | POA: Diagnosis not present

## 2021-12-05 DIAGNOSIS — R072 Precordial pain: Secondary | ICD-10-CM | POA: Diagnosis not present

## 2021-12-05 DIAGNOSIS — I420 Dilated cardiomyopathy: Secondary | ICD-10-CM | POA: Diagnosis not present

## 2021-12-05 DIAGNOSIS — I361 Nonrheumatic tricuspid (valve) insufficiency: Secondary | ICD-10-CM | POA: Diagnosis not present

## 2021-12-06 DIAGNOSIS — N3946 Mixed incontinence: Secondary | ICD-10-CM | POA: Diagnosis not present

## 2021-12-06 DIAGNOSIS — R350 Frequency of micturition: Secondary | ICD-10-CM | POA: Diagnosis not present

## 2021-12-06 DIAGNOSIS — R351 Nocturia: Secondary | ICD-10-CM | POA: Diagnosis not present

## 2021-12-07 DIAGNOSIS — R600 Localized edema: Secondary | ICD-10-CM | POA: Diagnosis not present

## 2021-12-07 DIAGNOSIS — R748 Abnormal levels of other serum enzymes: Secondary | ICD-10-CM | POA: Diagnosis not present

## 2022-01-03 DIAGNOSIS — I10 Essential (primary) hypertension: Secondary | ICD-10-CM | POA: Diagnosis not present

## 2022-01-04 DIAGNOSIS — R600 Localized edema: Secondary | ICD-10-CM | POA: Diagnosis not present

## 2022-01-04 DIAGNOSIS — I10 Essential (primary) hypertension: Secondary | ICD-10-CM | POA: Diagnosis not present

## 2022-01-04 DIAGNOSIS — N1831 Chronic kidney disease, stage 3a: Secondary | ICD-10-CM | POA: Diagnosis not present

## 2022-01-16 DIAGNOSIS — Z6841 Body Mass Index (BMI) 40.0 and over, adult: Secondary | ICD-10-CM | POA: Diagnosis not present

## 2022-01-16 DIAGNOSIS — N1832 Chronic kidney disease, stage 3b: Secondary | ICD-10-CM | POA: Diagnosis not present

## 2022-01-16 DIAGNOSIS — J45909 Unspecified asthma, uncomplicated: Secondary | ICD-10-CM | POA: Diagnosis not present

## 2022-01-16 DIAGNOSIS — I10 Essential (primary) hypertension: Secondary | ICD-10-CM | POA: Diagnosis not present

## 2022-01-17 DIAGNOSIS — M810 Age-related osteoporosis without current pathological fracture: Secondary | ICD-10-CM | POA: Diagnosis not present

## 2022-01-17 DIAGNOSIS — Z0000 Encounter for general adult medical examination without abnormal findings: Secondary | ICD-10-CM | POA: Diagnosis not present

## 2022-01-17 DIAGNOSIS — N1832 Chronic kidney disease, stage 3b: Secondary | ICD-10-CM | POA: Diagnosis not present

## 2022-01-17 DIAGNOSIS — I10 Essential (primary) hypertension: Secondary | ICD-10-CM | POA: Diagnosis not present

## 2022-01-17 DIAGNOSIS — I519 Heart disease, unspecified: Secondary | ICD-10-CM | POA: Diagnosis not present

## 2022-01-17 DIAGNOSIS — R748 Abnormal levels of other serum enzymes: Secondary | ICD-10-CM | POA: Diagnosis not present

## 2022-01-17 DIAGNOSIS — Z86711 Personal history of pulmonary embolism: Secondary | ICD-10-CM | POA: Diagnosis not present

## 2022-01-17 DIAGNOSIS — I700 Atherosclerosis of aorta: Secondary | ICD-10-CM | POA: Diagnosis not present

## 2022-01-17 DIAGNOSIS — Z23 Encounter for immunization: Secondary | ICD-10-CM | POA: Diagnosis not present

## 2022-01-17 DIAGNOSIS — R32 Unspecified urinary incontinence: Secondary | ICD-10-CM | POA: Diagnosis not present

## 2022-01-17 DIAGNOSIS — E039 Hypothyroidism, unspecified: Secondary | ICD-10-CM | POA: Diagnosis not present

## 2022-02-15 DIAGNOSIS — I10 Essential (primary) hypertension: Secondary | ICD-10-CM | POA: Diagnosis not present

## 2022-02-15 DIAGNOSIS — M810 Age-related osteoporosis without current pathological fracture: Secondary | ICD-10-CM | POA: Diagnosis not present

## 2022-02-15 DIAGNOSIS — N1832 Chronic kidney disease, stage 3b: Secondary | ICD-10-CM | POA: Diagnosis not present

## 2022-02-15 DIAGNOSIS — J45909 Unspecified asthma, uncomplicated: Secondary | ICD-10-CM | POA: Diagnosis not present

## 2022-02-15 DIAGNOSIS — Z6841 Body Mass Index (BMI) 40.0 and over, adult: Secondary | ICD-10-CM | POA: Diagnosis not present

## 2022-02-15 DIAGNOSIS — G629 Polyneuropathy, unspecified: Secondary | ICD-10-CM | POA: Diagnosis not present

## 2022-03-14 DIAGNOSIS — J42 Unspecified chronic bronchitis: Secondary | ICD-10-CM | POA: Diagnosis not present

## 2022-03-14 DIAGNOSIS — I361 Nonrheumatic tricuspid (valve) insufficiency: Secondary | ICD-10-CM | POA: Diagnosis not present

## 2022-03-14 DIAGNOSIS — I420 Dilated cardiomyopathy: Secondary | ICD-10-CM | POA: Diagnosis not present

## 2022-03-14 DIAGNOSIS — R072 Precordial pain: Secondary | ICD-10-CM | POA: Diagnosis not present

## 2022-03-14 DIAGNOSIS — M810 Age-related osteoporosis without current pathological fracture: Secondary | ICD-10-CM | POA: Diagnosis not present

## 2022-03-18 DIAGNOSIS — Z6841 Body Mass Index (BMI) 40.0 and over, adult: Secondary | ICD-10-CM | POA: Diagnosis not present

## 2022-03-18 DIAGNOSIS — I10 Essential (primary) hypertension: Secondary | ICD-10-CM | POA: Diagnosis not present

## 2022-03-18 DIAGNOSIS — J45909 Unspecified asthma, uncomplicated: Secondary | ICD-10-CM | POA: Diagnosis not present

## 2022-03-18 DIAGNOSIS — N1832 Chronic kidney disease, stage 3b: Secondary | ICD-10-CM | POA: Diagnosis not present

## 2022-03-26 DIAGNOSIS — Z1231 Encounter for screening mammogram for malignant neoplasm of breast: Secondary | ICD-10-CM | POA: Diagnosis not present

## 2022-06-21 DIAGNOSIS — N3944 Nocturnal enuresis: Secondary | ICD-10-CM | POA: Diagnosis not present

## 2022-06-21 DIAGNOSIS — N3946 Mixed incontinence: Secondary | ICD-10-CM | POA: Diagnosis not present

## 2022-06-21 DIAGNOSIS — R350 Frequency of micturition: Secondary | ICD-10-CM | POA: Diagnosis not present

## 2022-06-21 IMAGING — RF DG UGI W/ HIGH DENSITY W/O KUB
6 series · 14 of 24 positions shown · non-contrast
Comparison: None.

CLINICAL DATA: Epigastric pain.  Gastroesophageal reflux disease.

EXAM:
UPPER GI SERIES WITH KUB
TECHNIQUE: After obtaining a scout radiograph a routine upper GI series was
performed using thin and high density barium.
FLUOROSCOPY TIME:  Fluoroscopy Time:  3 minutes 12 seconds
Radiation Exposure Index (if provided by the fluoroscopic device):
86.0 mGy
Number of Acquired Spot Images: 0

[Series 1: one shot · 0.14mm/px · 1 of 1 slices shown (1 of 3)]
[im 1/1]
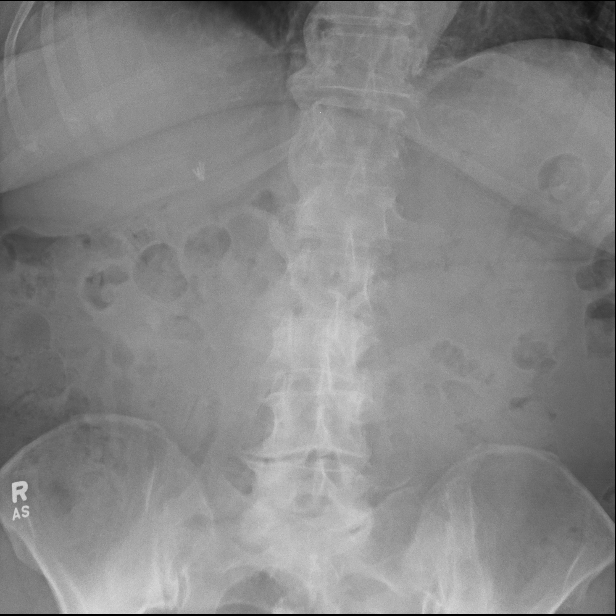

[Series 2: sequence · 1 of 63 frames shown (1 of 3)]
[frame 44/63]
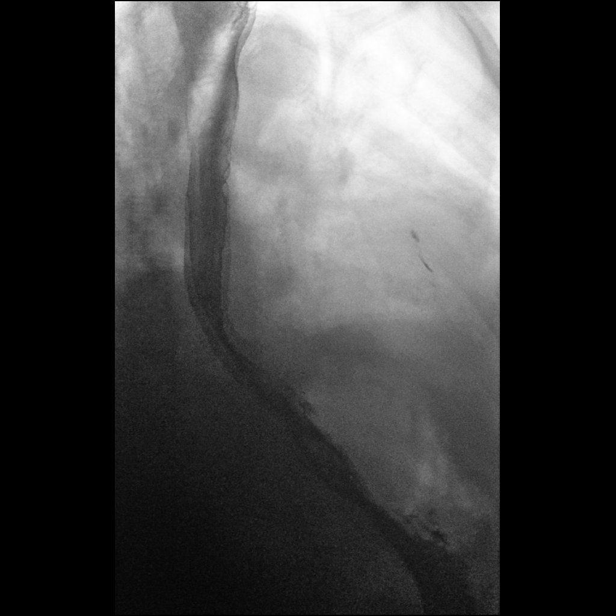

[Series 3: one shot · 6 of 12 slices shown (2 of 3)]
[im 1/12]
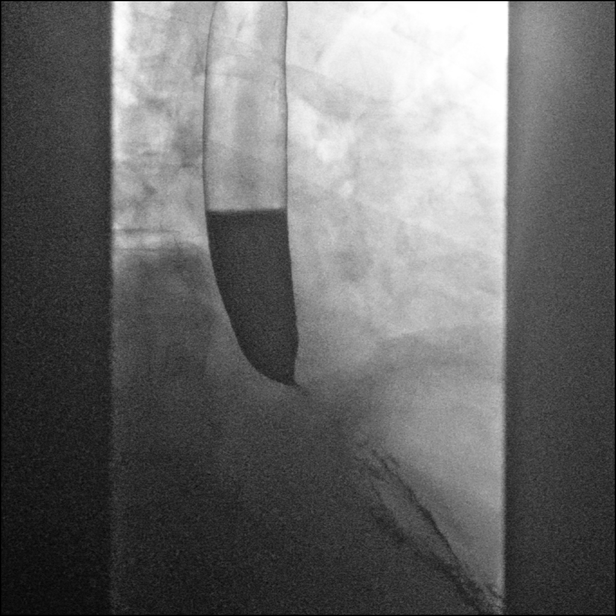
[im 4/12]
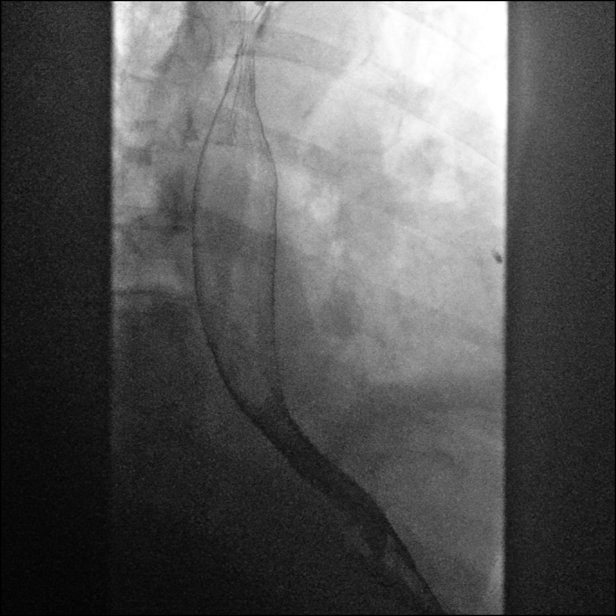
[im 5/12]
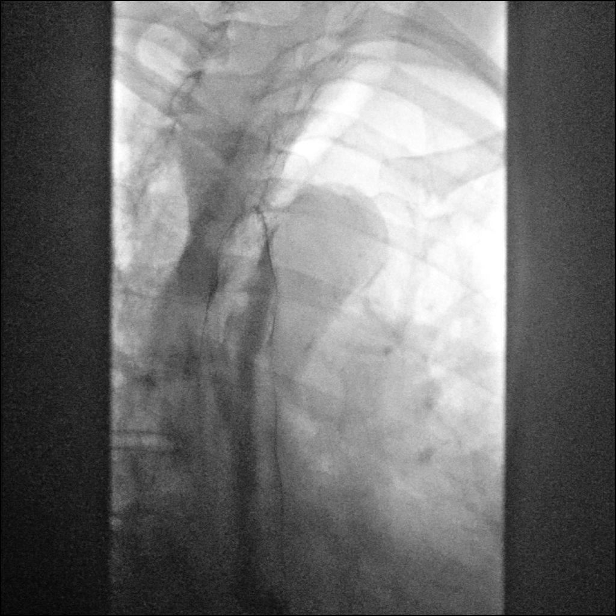
[im 8/12]
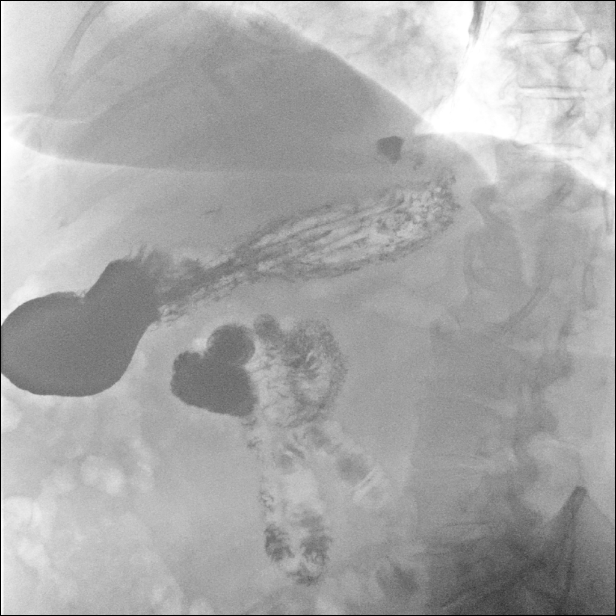
[im 11/12]
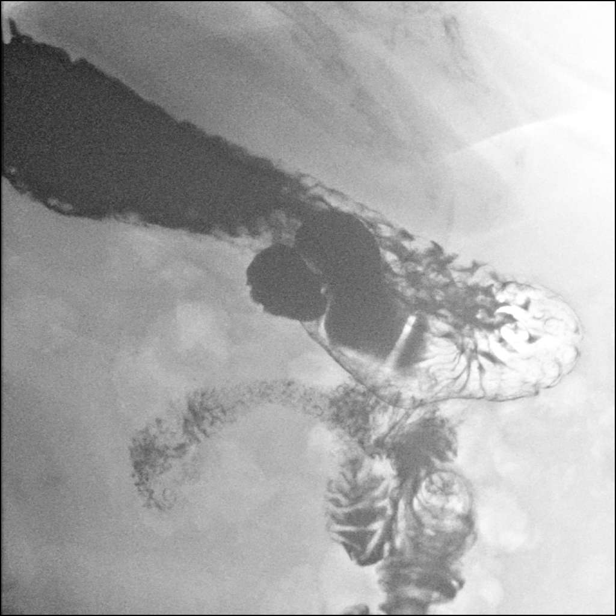
[im 12/12]
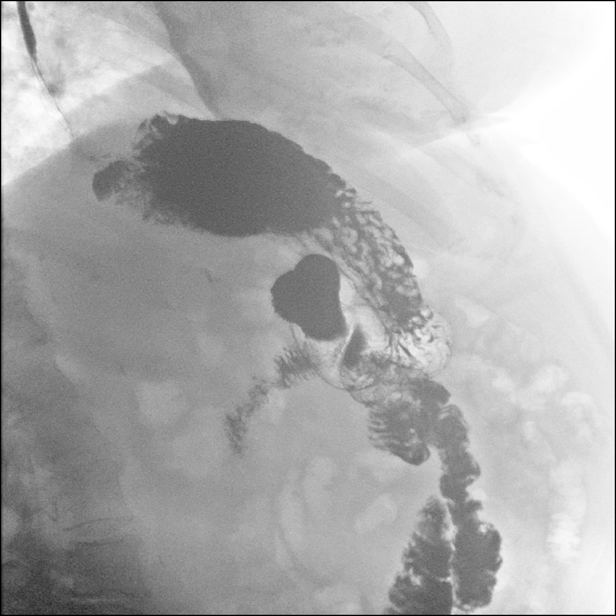

[Series 4: sequence · 1 of 49 frames shown (2 of 3)]
[frame 33/49]
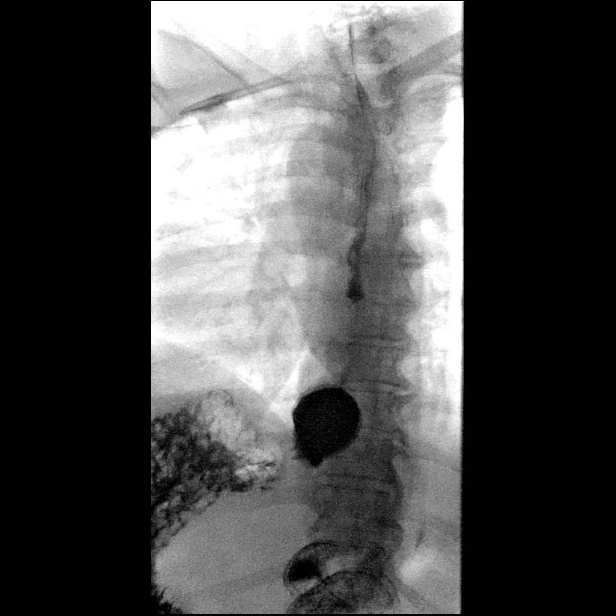

[Series 5: sequence · 2 of 100 frames shown (3 of 3)]
[frame 16/100]
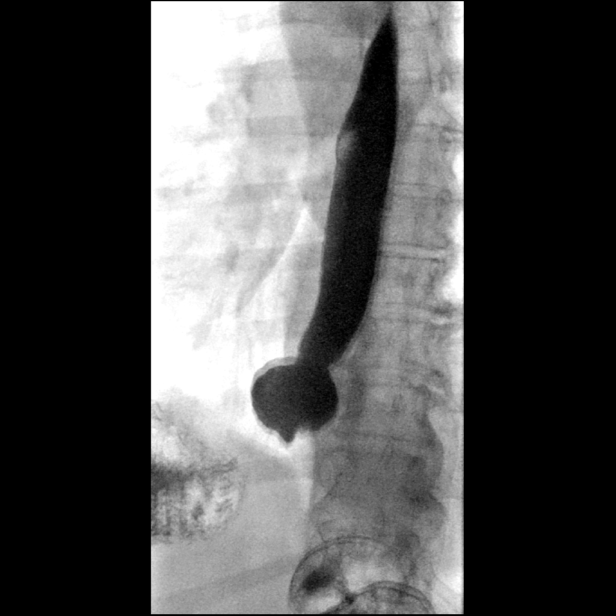
[frame 86/100]
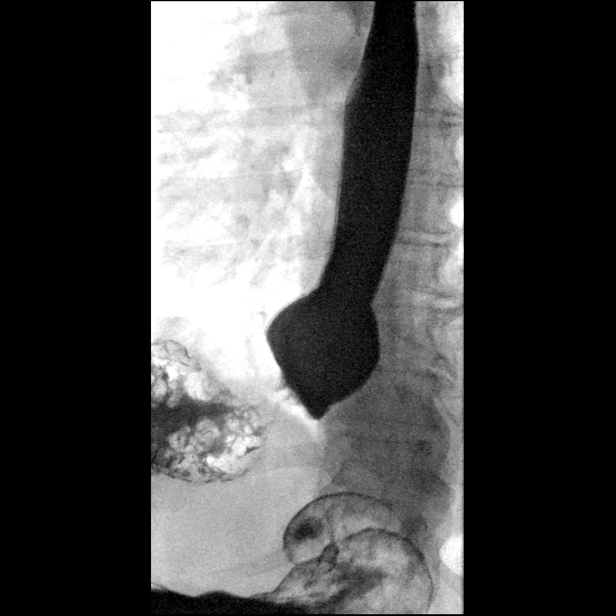

[Series 6: one shot · 3 of 7 slices shown (3 of 3)]
[im 2/7]
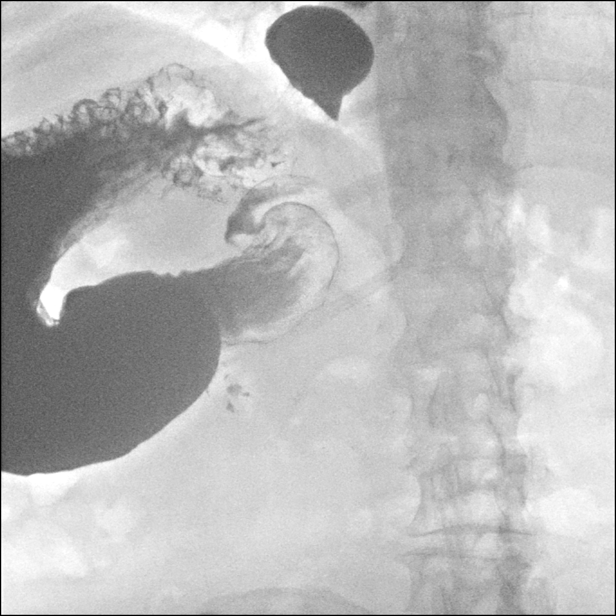
[im 4/7]
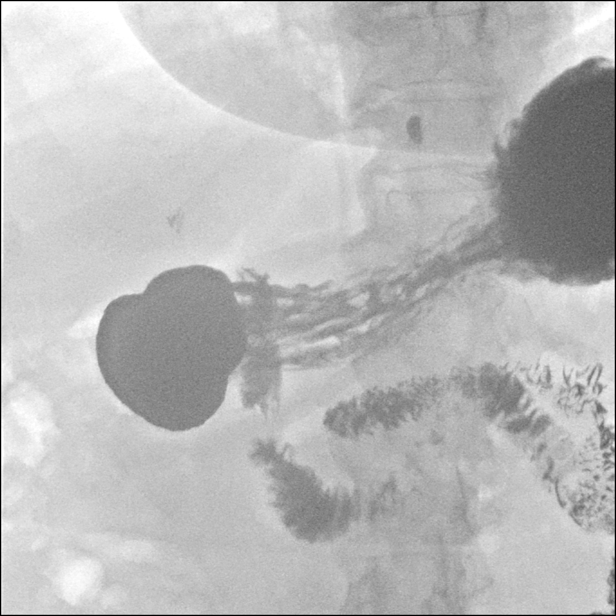
[im 7/7]
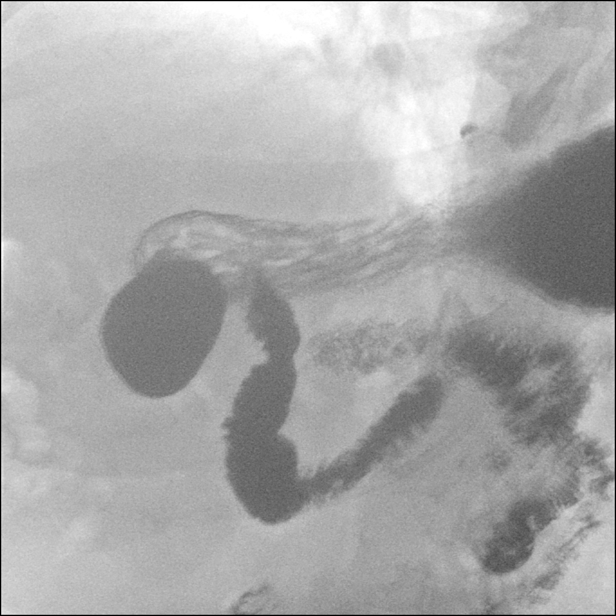

[14 of 24 positions shown; findings below may reference images not displayed]

FINDINGS: Scout radiograph: Unremarkable bowel gas pattern. Right upper
quadrant surgical clips are seen, consistent with prior
cholecystectomy.

Esophagus: No evidence of esophageal mass or stricture. No findings
of esophagitis are seen. Motility is within normal limits. No
gastroesophageal reflux observed. Ingested 13mm barium tablet passed
through the esophagus and into the stomach, without becoming stuck.

Stomach: A small sliding hiatal hernia is seen. The stomach is
incompletely distended with air, but is otherwise unremarkable in
appearance. No evidence of gastric mass or ulcer.

Duodenum: Normal appearance of duodenal bulb and sweep. Normal
position of ligament of Treitz. No ulcer, stricture, or other
significant abnormality seen.

Other:  None.
IMPRESSION: Small sliding hiatal hernia.

No evidence of gastroesophageal reflux, esophageal stricture, or
other significant abnormality.

## 2022-07-02 DIAGNOSIS — I420 Dilated cardiomyopathy: Secondary | ICD-10-CM | POA: Diagnosis not present

## 2022-07-02 DIAGNOSIS — I361 Nonrheumatic tricuspid (valve) insufficiency: Secondary | ICD-10-CM | POA: Diagnosis not present

## 2022-07-02 DIAGNOSIS — R072 Precordial pain: Secondary | ICD-10-CM | POA: Diagnosis not present

## 2022-07-25 DIAGNOSIS — R350 Frequency of micturition: Secondary | ICD-10-CM | POA: Diagnosis not present

## 2022-07-25 DIAGNOSIS — N3944 Nocturnal enuresis: Secondary | ICD-10-CM | POA: Diagnosis not present

## 2022-07-25 DIAGNOSIS — N3946 Mixed incontinence: Secondary | ICD-10-CM | POA: Diagnosis not present

## 2022-08-09 DIAGNOSIS — H04123 Dry eye syndrome of bilateral lacrimal glands: Secondary | ICD-10-CM | POA: Diagnosis not present

## 2022-08-09 DIAGNOSIS — H2513 Age-related nuclear cataract, bilateral: Secondary | ICD-10-CM | POA: Diagnosis not present

## 2022-08-09 DIAGNOSIS — H10413 Chronic giant papillary conjunctivitis, bilateral: Secondary | ICD-10-CM | POA: Diagnosis not present

## 2022-08-31 DIAGNOSIS — H524 Presbyopia: Secondary | ICD-10-CM | POA: Diagnosis not present

## 2022-10-23 DIAGNOSIS — I420 Dilated cardiomyopathy: Secondary | ICD-10-CM | POA: Diagnosis not present

## 2022-10-23 DIAGNOSIS — R072 Precordial pain: Secondary | ICD-10-CM | POA: Diagnosis not present

## 2022-10-23 DIAGNOSIS — I361 Nonrheumatic tricuspid (valve) insufficiency: Secondary | ICD-10-CM | POA: Diagnosis not present

## 2023-01-29 DIAGNOSIS — I361 Nonrheumatic tricuspid (valve) insufficiency: Secondary | ICD-10-CM | POA: Diagnosis not present

## 2023-01-29 DIAGNOSIS — I420 Dilated cardiomyopathy: Secondary | ICD-10-CM | POA: Diagnosis not present

## 2023-01-29 DIAGNOSIS — R072 Precordial pain: Secondary | ICD-10-CM | POA: Diagnosis not present

## 2023-02-11 DIAGNOSIS — E039 Hypothyroidism, unspecified: Secondary | ICD-10-CM | POA: Diagnosis not present

## 2023-02-11 DIAGNOSIS — I10 Essential (primary) hypertension: Secondary | ICD-10-CM | POA: Diagnosis not present

## 2023-02-28 DIAGNOSIS — J45909 Unspecified asthma, uncomplicated: Secondary | ICD-10-CM | POA: Diagnosis not present

## 2023-02-28 DIAGNOSIS — K589 Irritable bowel syndrome without diarrhea: Secondary | ICD-10-CM | POA: Diagnosis not present

## 2023-02-28 DIAGNOSIS — Z0000 Encounter for general adult medical examination without abnormal findings: Secondary | ICD-10-CM | POA: Diagnosis not present

## 2023-02-28 DIAGNOSIS — I700 Atherosclerosis of aorta: Secondary | ICD-10-CM | POA: Diagnosis not present

## 2023-02-28 DIAGNOSIS — N1832 Chronic kidney disease, stage 3b: Secondary | ICD-10-CM | POA: Diagnosis not present

## 2023-02-28 DIAGNOSIS — E039 Hypothyroidism, unspecified: Secondary | ICD-10-CM | POA: Diagnosis not present

## 2023-02-28 DIAGNOSIS — N200 Calculus of kidney: Secondary | ICD-10-CM | POA: Diagnosis not present

## 2023-02-28 DIAGNOSIS — R32 Unspecified urinary incontinence: Secondary | ICD-10-CM | POA: Diagnosis not present

## 2023-02-28 DIAGNOSIS — Z23 Encounter for immunization: Secondary | ICD-10-CM | POA: Diagnosis not present

## 2023-03-15 DIAGNOSIS — M810 Age-related osteoporosis without current pathological fracture: Secondary | ICD-10-CM | POA: Diagnosis not present

## 2023-04-11 DIAGNOSIS — I700 Atherosclerosis of aorta: Secondary | ICD-10-CM | POA: Diagnosis not present

## 2023-04-11 DIAGNOSIS — R748 Abnormal levels of other serum enzymes: Secondary | ICD-10-CM | POA: Diagnosis not present

## 2023-04-11 DIAGNOSIS — E559 Vitamin D deficiency, unspecified: Secondary | ICD-10-CM | POA: Diagnosis not present

## 2023-04-15 DIAGNOSIS — E559 Vitamin D deficiency, unspecified: Secondary | ICD-10-CM | POA: Diagnosis not present

## 2023-04-15 DIAGNOSIS — I872 Venous insufficiency (chronic) (peripheral): Secondary | ICD-10-CM | POA: Diagnosis not present

## 2023-04-15 DIAGNOSIS — W5503XA Scratched by cat, initial encounter: Secondary | ICD-10-CM | POA: Diagnosis not present

## 2023-05-07 DIAGNOSIS — R600 Localized edema: Secondary | ICD-10-CM | POA: Diagnosis not present

## 2023-05-07 DIAGNOSIS — I87393 Chronic venous hypertension (idiopathic) with other complications of bilateral lower extremity: Secondary | ICD-10-CM | POA: Diagnosis not present

## 2023-05-07 DIAGNOSIS — I890 Lymphedema, not elsewhere classified: Secondary | ICD-10-CM | POA: Diagnosis not present

## 2023-05-07 DIAGNOSIS — I872 Venous insufficiency (chronic) (peripheral): Secondary | ICD-10-CM | POA: Diagnosis not present

## 2023-05-14 DIAGNOSIS — I872 Venous insufficiency (chronic) (peripheral): Secondary | ICD-10-CM | POA: Diagnosis not present

## 2023-05-29 DIAGNOSIS — I420 Dilated cardiomyopathy: Secondary | ICD-10-CM | POA: Diagnosis not present

## 2023-05-29 DIAGNOSIS — R072 Precordial pain: Secondary | ICD-10-CM | POA: Diagnosis not present

## 2023-05-29 DIAGNOSIS — E7849 Other hyperlipidemia: Secondary | ICD-10-CM | POA: Diagnosis not present

## 2023-06-04 ENCOUNTER — Other Ambulatory Visit: Payer: Self-pay | Admitting: Nurse Practitioner

## 2023-06-04 DIAGNOSIS — Z860100 Personal history of colon polyps, unspecified: Secondary | ICD-10-CM | POA: Diagnosis not present

## 2023-06-04 DIAGNOSIS — Z83719 Family history of colon polyps, unspecified: Secondary | ICD-10-CM | POA: Diagnosis not present

## 2023-06-04 DIAGNOSIS — Z86711 Personal history of pulmonary embolism: Secondary | ICD-10-CM | POA: Diagnosis not present

## 2023-06-11 DIAGNOSIS — Z1231 Encounter for screening mammogram for malignant neoplasm of breast: Secondary | ICD-10-CM | POA: Diagnosis not present

## 2023-06-19 DIAGNOSIS — R600 Localized edema: Secondary | ICD-10-CM | POA: Diagnosis not present

## 2023-06-19 DIAGNOSIS — I890 Lymphedema, not elsewhere classified: Secondary | ICD-10-CM | POA: Diagnosis not present

## 2023-07-04 ENCOUNTER — Ambulatory Visit
Admission: RE | Admit: 2023-07-04 | Discharge: 2023-07-04 | Disposition: A | Discharge status: Home / Self Care | Source: Ambulatory Visit | Attending: Nurse Practitioner | Admitting: Nurse Practitioner

## 2023-07-04 DIAGNOSIS — N200 Calculus of kidney: Secondary | ICD-10-CM | POA: Diagnosis not present

## 2023-07-04 DIAGNOSIS — Z83719 Family history of colon polyps, unspecified: Secondary | ICD-10-CM | POA: Diagnosis not present

## 2023-07-04 DIAGNOSIS — R109 Unspecified abdominal pain: Secondary | ICD-10-CM | POA: Diagnosis not present

## 2023-07-04 DIAGNOSIS — K5900 Constipation, unspecified: Secondary | ICD-10-CM | POA: Diagnosis not present

## 2023-07-04 DIAGNOSIS — Z860100 Personal history of colon polyps, unspecified: Secondary | ICD-10-CM

## 2023-07-04 DIAGNOSIS — K449 Diaphragmatic hernia without obstruction or gangrene: Secondary | ICD-10-CM | POA: Diagnosis not present

## 2023-07-11 ENCOUNTER — Ambulatory Visit: Admitting: Physician Assistant

## 2023-07-11 ENCOUNTER — Other Ambulatory Visit (INDEPENDENT_AMBULATORY_CARE_PROVIDER_SITE_OTHER): Payer: Self-pay

## 2023-07-11 ENCOUNTER — Encounter: Payer: Self-pay | Admitting: Physician Assistant

## 2023-07-11 DIAGNOSIS — M1712 Unilateral primary osteoarthritis, left knee: Secondary | ICD-10-CM

## 2023-07-11 DIAGNOSIS — M170 Bilateral primary osteoarthritis of knee: Secondary | ICD-10-CM | POA: Diagnosis not present

## 2023-07-11 DIAGNOSIS — M1711 Unilateral primary osteoarthritis, right knee: Secondary | ICD-10-CM | POA: Diagnosis not present

## 2023-07-11 MED ORDER — LIDOCAINE HCL 1 % IJ SOLN
3.0000 mL | INTRAMUSCULAR | Status: AC | PRN
  Administered 2023-07-11: 3 mL

## 2023-07-11 MED ORDER — METHYLPREDNISOLONE ACETATE 40 MG/ML IJ SUSP
40.0000 mg | INTRAMUSCULAR | Status: AC | PRN
  Administered 2023-07-11: 40 mg via INTRA_ARTICULAR

## 2023-07-11 MED ORDER — LIDOCAINE HCL 1 % IJ SOLN
5.0000 mL | INTRAMUSCULAR | Status: AC | PRN
  Administered 2023-07-11: 5 mL

## 2023-07-11 NOTE — Progress Notes (Signed)
 Office Visit Note   Patient: Shannon Hammond           Date of Birth: 02/18/1946           MRN: [id number] Visit Date: 07/11/2023              Requested by: Imelda Man, MD [address] [place],  [place] [address] PCP: Imelda Man, MD   Assessment & Plan: Visit Diagnoses:  1. Primary osteoarthritis of left knee   2. Primary osteoarthritis of right knee     Plan:  Patient knows to wait at least 3 months between injections.  Follow-up as needed pain persist or becomes worse.  Questions were encouraged and answered at length.  Follow-Up Instructions: No follow-ups on file.   Orders:  Orders Placed This Encounter  Procedures   Large Joint Inj: bilateral knee   XR Knee 1-2 Views Right   XR Knee 1-2 Views Left   No orders of the defined types were placed in this encounter.     Procedures: Large Joint Inj: bilateral knee on 07/11/2023 2:16 PM Indications: pain Details: 22 G 1.5 in needle, anterolateral approach  Arthrogram: No  Medications (Right): 5 mL lidocaine  1 %; 40 mg methylPREDNISolone  acetate 40 MG/ML Aspirate (Right): 1 mL bloody Medications (Left): 3 mL lidocaine  1 %; 40 mg methylPREDNISolone  acetate 40 MG/ML Outcome: tolerated well, no immediate complications Procedure, treatment alternatives, risks and benefits explained, specific risks discussed. Consent was given by the patient. Immediately prior to procedure a time out was called to verify the correct patient, procedure, equipment, support staff and site/side marked as required. Patient was prepped and draped in the usual sterile fashion.       Clinical Data: No additional findings.   Subjective: Chief Complaint  Patient presents with   Right Knee - Pain   Left Knee - Pain    HPI Shannon Hammond 78 year old female comes in today requesting bilateral knee injections last injections were given 11/29/2021.  She states she has done well until recently.  Currently the right knee pain is  worse than the left.  Mostly having right knee posterior pain in the lateral pain.  Denies any injury to either knee.  Denies any mechanical symptoms.  Denies any fevers chills or ongoing infection.  She is nondiabetic.  Review of Systems  Constitutional:  Negative for chills and fever.     Objective: Vital Signs: There were no vitals taken for this visit.  Physical Exam Constitutional:      Appearance: She is not ill-appearing or diaphoretic.  Pulmonary:     Effort: Pulmonary effort is normal.  Neurological:     Mental Status: She is alert.  Psychiatric:        Mood and Affect: Mood normal.     Ortho Exam Bilateral knees: Patellofemoral crepitus.  Good range of motion of both knees.  Tenderness along medial lateral joint line of both knees.  No gross instability.  No abnormal warmth erythema of either knee.  Plus minus effusion right knee.  Specialty Comments:  No specialty comments available.  Imaging: XR Knee 1-2 Views Right Result Date: 07/11/2023 Right knee 2 views: Tricompartmental changes with near bone-on-bone medial compartment.  Lateral compartment moderate arthritic changes with marginal osteophytes.  Severe patellofemoral arthritic changes.  No acute fractures or acute findings.  Knee is well located.  XR Knee 1-2 Views Left Result Date: 07/11/2023 Left knee: Tricompartmental arthritic changes with bone-on-bone medial compartment severe patellofemoral changes.  Lateral compartment with marginal osteophytes and narrowing.  No acute fractures or findings.    PMFS History: Patient Active Problem List   Diagnosis Date Noted   Enteritis 10/28/2020   Abdominal pain 10/27/2020   RVF (right ventricular failure) (HCC) 03/02/2016   Morbid obesity (HCC) 03/02/2016   Right leg DVT (HCC) 03/02/2016   CKD (chronic kidney disease) stage 4, GFR 15-29 ml/min (HCC) 03/02/2016   Subacute massive pulmonary embolism (HCC) 02/29/2016   Paroxysmal A-fib (HCC) 02/29/2016   Chest  pain 11/03/2014   Dyspnea 11/02/2014   Past Medical History:  Diagnosis Date   Anginal pain (HCC)    Arthritis    BACK AND KNEES   DVT (deep venous thrombosis) (HCC) 03/19/2010   RIGHT LEG   GERD (gastroesophageal reflux disease)    Heart murmur    Hemorrhoids    History of kidney stones    History of nocturia    Hypertension    Hypothyroidism    PE (pulmonary embolism)    HX PE 2012 - TX'D IN [facility]- HOSPITALIZED X 1 WEEK.   NO LONGER ON BLOOD THINNER OTHER THAN 81 MG ASPIRIN    Seizures (HCC) 03/19/2005   ONLY ONCE - THOUGHT TO BE STRESS INDUCED - PT WAS DEALING WITH HER MOTHER'S DEATH   Shortness of breath    PT TOLD BY HER MEDICAL DOCTOR - SOME LUNG CHANGES DUE TO 2ND HAND SMOKE - SHE WAS GIVEN INHALER TO USE AS NEEDED.    Family History  Problem Relation Age of Onset   Heart attack Father    Clotting disorder Neg Hx     Past Surgical History:  Procedure Laterality Date   BREAST SURGERY  1993   CYST REMOVED LEFT BREAST   CHOLECYSTECTOMY  LATE 1990'S   CYSTOSCOPY WITH RETROGRADE PYELOGRAM, URETEROSCOPY AND STENT PLACEMENT Right 11/27/2012   Procedure: CYSTOSCOPY WITH RETROGRADE PYELOGRAM, URETEROSCOPY, stone basketry AND STENT PLACEMENT;  Surgeon: Trent Frizzle, MD;  Location: [facility];  Service: Urology;  Laterality: Right;   HOLMIUM LASER APPLICATION Right 11/27/2012   Procedure: HOLMIUM LASER APPLICATION;  Surgeon: Trent Frizzle, MD;  Location: [facility];  Service: Urology;  Laterality: Right;   LITHOTRIPSY     BOTH KIDNEYS   Social History   Occupational History   Not on file  Tobacco Use   Smoking status: Never   Smokeless tobacco: Never  Substance and Sexual Activity   Alcohol use: No   Drug use: No   Sexual activity: Not on file

## 2023-07-31 ENCOUNTER — Other Ambulatory Visit: Payer: Self-pay | Admitting: Internal Medicine

## 2023-07-31 DIAGNOSIS — R1031 Right lower quadrant pain: Secondary | ICD-10-CM

## 2023-07-31 DIAGNOSIS — I890 Lymphedema, not elsewhere classified: Secondary | ICD-10-CM | POA: Diagnosis not present

## 2023-07-31 DIAGNOSIS — R109 Unspecified abdominal pain: Secondary | ICD-10-CM | POA: Diagnosis not present

## 2023-07-31 DIAGNOSIS — M545 Low back pain, unspecified: Secondary | ICD-10-CM | POA: Diagnosis not present

## 2023-08-01 ENCOUNTER — Encounter: Payer: Self-pay | Admitting: Internal Medicine

## 2023-08-08 ENCOUNTER — Ambulatory Visit
Admission: RE | Admit: 2023-08-08 | Discharge: 2023-08-08 | Disposition: A | Discharge status: Home / Self Care | Source: Ambulatory Visit | Attending: Internal Medicine | Admitting: Internal Medicine

## 2023-08-08 DIAGNOSIS — R1031 Right lower quadrant pain: Secondary | ICD-10-CM

## 2023-08-08 DIAGNOSIS — N200 Calculus of kidney: Secondary | ICD-10-CM | POA: Diagnosis not present

## 2023-08-08 DIAGNOSIS — N83201 Unspecified ovarian cyst, right side: Secondary | ICD-10-CM | POA: Diagnosis not present

## 2023-08-21 DIAGNOSIS — N3946 Mixed incontinence: Secondary | ICD-10-CM | POA: Diagnosis not present

## 2023-08-21 DIAGNOSIS — N202 Calculus of kidney with calculus of ureter: Secondary | ICD-10-CM | POA: Diagnosis not present

## 2023-08-21 DIAGNOSIS — R350 Frequency of micturition: Secondary | ICD-10-CM | POA: Diagnosis not present

## 2023-09-13 DIAGNOSIS — N202 Calculus of kidney with calculus of ureter: Secondary | ICD-10-CM | POA: Diagnosis not present

## 2023-09-13 DIAGNOSIS — N3020 Other chronic cystitis without hematuria: Secondary | ICD-10-CM | POA: Diagnosis not present

## 2023-09-16 ENCOUNTER — Other Ambulatory Visit: Payer: Self-pay | Admitting: Urology

## 2023-09-18 DIAGNOSIS — N200 Calculus of kidney: Secondary | ICD-10-CM | POA: Diagnosis not present

## 2023-09-18 DIAGNOSIS — Z01818 Encounter for other preprocedural examination: Secondary | ICD-10-CM | POA: Diagnosis not present

## 2023-09-18 NOTE — Progress Notes (Signed)
 Sent message, via epic in basket, requesting orders in epic from Careers adviser.

## 2023-09-19 NOTE — Progress Notes (Addendum)
 COVID Vaccine received:  []  No [x]  Yes Date of any COVID positive Test in last 90 days: no PCP - Ryan Hives MD Cardiologist - Dr. Claudene  Chest x-ray -  EKG -   Stress Test -  ECHO - 02/28/16 Epic Cardiac Cath -   Bowel Prep - [x]  No  []   Yes ______  Pacemaker / ICD device [x]  No []  Yes   Spinal Cord Stimulator:[x]  No []  Yes       History of Sleep Apnea? [x]  No []  Yes   CPAP used?- [x]  No []  Yes    Does the patient monitor blood sugar?          [x]  No []  Yes  []  N/A  Patient has: [x]  NO Hx DM   []  Pre-DM                 []  DM1  []   DM2 Does patient have a [employer] or Dexacom? []  No []  Yes   Fasting Blood Sugar Ranges-  Checks Blood Sugar _____ times a day  GLP1 agonist / usual dose - no GLP1 instructions:  SGLT-2 inhibitors / usual dose - no SGLT-2 instructions:   Blood Thinner / Instructions:Eliquis . Last dose 09/22/23 at 8 am Aspirin  Instructions:n/a  Comments:   Activity level: Patient is able to climb a flight of stairs without difficulty; [x]  No CP  [x]  No SOB___   Patient can  perform ADLs without assistance.   Anesthesia review: a-fib, CKD, Hx. PE, R ventricular failure, abnl. EKG, HTN  Patient denies shortness of breath, fever, cough and chest pain at PAT appointment.  Patient verbalized understanding and agreement to the Pre-Surgical Instructions that were given to them at this PAT appointment. Patient was also educated of the need to review these PAT instructions again prior to his/her surgery.I reviewed the appropriate phone numbers to call if they have any and questions or concerns.

## 2023-09-19 NOTE — Patient Instructions (Signed)
 SURGICAL WAITING ROOM VISITATION  Patients having surgery or a procedure may have no more than 2 support people in the waiting area - these visitors may rotate.    Children under the age of 61 must have an adult with them who is not the patient.  Visitors with respiratory illnesses are discouraged from visiting and should remain at home.  If the patient needs to stay at the hospital during part of their recovery, the visitor guidelines for inpatient rooms apply. Pre-op nurse will coordinate an appropriate time for 1 support person to accompany patient in pre-op.  This support person may not rotate.    Please refer to the [facility] website for the visitor guidelines for Inpatients (after your surgery is over and you are in a regular room).       Your procedure is scheduled on: 09/25/23   Report to [facility] Main Entrance    Report to admitting at 9:45 AM   Call this number if you have problems the morning of surgery [phone]   Do not eat food or drink liquids :After Midnight.but may have sips of water with meds.       Oral Hygiene is also important to reduce your risk of infection.                                    Remember - BRUSH YOUR TEETH THE MORNING OF SURGERY WITH YOUR REGULAR TOOTHPASTE  DENTURES WILL BE REMOVED PRIOR TO SURGERY PLEASE DO NOT APPLY Poly grip OR ADHESIVES!!!   Stop all vitamins and herbal supplements 7 days before surgery.   Take these medicines the morning of surgery with A SIP OF WATER: diltiazem (cardizem ), gabapentin, inhalers, tylenol  if needed, synthroid , omeprazole, trimethoprim(trimpex)             You may not have any metal on your body including hair pins, jewelry, and body piercing             Do not wear make-up, lotions, powders, perfumes/cologne, or deodorant  Do not wear nail polish including gel and S&S, artificial/acrylic nails, or any other type of covering on natural nails including finger and toenails. If you have  artificial nails, gel coating, etc. that needs to be removed by a nail salon please have this removed prior to surgery or surgery may need to be canceled/ delayed if the surgeon/ anesthesia feels like they are unable to be safely monitored.   Do not shave  48 hours prior to surgery.    Do not bring valuables to the hospital. [place] [facility] IS NOT             RESPONSIBLE   FOR VALUABLES.   Contacts, glasses, dentures or bridgework may not be worn into surgery.  DO NOT BRING YOUR HOME MEDICATIONS TO THE HOSPITAL. PHARMACY WILL DISPENSE MEDICATIONS LISTED ON YOUR MEDICATION LIST TO YOU DURING YOUR ADMISSION IN THE HOSPITAL!    Patients discharged on the day of surgery will not be allowed to drive home.  Someone NEEDS to stay with you for the first 24 hours after anesthesia.   Special Instructions: Bring a copy of your healthcare power of attorney and living will documents the day of surgery if you haven't scanned them before.              Please read over the following fact sheets you were given: IF YOU HAVE QUESTIONS ABOUT  YOUR PRE-OP INSTRUCTIONS PLEASE CALL [phone] [place]   If you received a COVID test during your pre-op visit  it is requested that you wear a mask when out in public, stay away from anyone that may not be feeling well and notify your surgeon if you develop symptoms. If you test positive for Covid or have been in contact with anyone that has tested positive in the last 10 days please notify you surgeon.    [facility] - Preparing for Surgery Before surgery, you can play an important role.  Because skin is not sterile, your skin needs to be as free of germs as possible.  You can reduce the number of germs on your skin by washing with CHG (chlorahexidine gluconate) soap before surgery.  CHG is an antiseptic cleaner which kills germs and bonds with the skin to continue killing germs even after washing. Please DO NOT use if you have an allergy to CHG or antibacterial soaps.  If  your skin becomes reddened/irritated stop using the CHG and inform your nurse when you arrive at Short Stay. Do not shave (including legs and underarms) for at least 48 hours prior to the first CHG shower.  You may shave your face/neck.  Please follow these instructions carefully:  1.  Shower with CHG Soap the night before surgery and the  morning of surgery.  2.  If you choose to wash your hair, wash your hair first as usual with your normal  shampoo.  3.  After you shampoo, rinse your hair and body thoroughly to remove the shampoo.                             4.  Use CHG as you would any other liquid soap.  You can apply chg directly to the skin and wash.  Gently with a scrungie or clean washcloth.  5.  Apply the CHG Soap to your body ONLY FROM THE NECK DOWN.   Do   not use on face/ open                           Wound or open sores. Avoid contact with eyes, ears mouth and   genitals (private parts).                       Wash face,  Genitals (private parts) with your normal soap.             6.  Wash thoroughly, paying special attention to the area where your    surgery  will be performed.  7.  Thoroughly rinse your body with warm water from the neck down.  8.  DO NOT shower/wash with your normal soap after using and rinsing off the CHG Soap.                9.  Pat yourself dry with a clean towel.            10.  Wear clean pajamas.            11.  Place clean sheets on your bed the night of your first shower and do not  sleep with pets. Day of Surgery : Do not apply any lotions/deodorants the morning of surgery.  Please wear clean clothes to the hospital/surgery center.  FAILURE TO FOLLOW THESE INSTRUCTIONS MAY RESULT IN THE CANCELLATION OF YOUR SURGERY  PATIENT SIGNATURE_________________________________  NURSE SIGNATURE__________________________________  ________________________________________________________________________

## 2023-09-23 ENCOUNTER — Encounter (HOSPITAL_COMMUNITY): Payer: Self-pay

## 2023-09-23 ENCOUNTER — Other Ambulatory Visit: Payer: Self-pay

## 2023-09-23 ENCOUNTER — Encounter (HOSPITAL_COMMUNITY)
Admission: RE | Admit: 2023-09-23 | Discharge: 2023-09-23 | Disposition: A | Discharge status: Home / Self Care | Source: Ambulatory Visit | Attending: Urology | Admitting: Urology

## 2023-09-23 VITALS — BP 161/82 | HR 75 | Temp 98.0°F | Resp 20 | Ht 66.0 in | Wt 270.0 lb

## 2023-09-23 DIAGNOSIS — Z86718 Personal history of other venous thrombosis and embolism: Secondary | ICD-10-CM | POA: Insufficient documentation

## 2023-09-23 DIAGNOSIS — E669 Obesity, unspecified: Secondary | ICD-10-CM | POA: Insufficient documentation

## 2023-09-23 DIAGNOSIS — I700 Atherosclerosis of aorta: Secondary | ICD-10-CM | POA: Diagnosis not present

## 2023-09-23 DIAGNOSIS — Z01818 Encounter for other preprocedural examination: Secondary | ICD-10-CM | POA: Diagnosis not present

## 2023-09-23 DIAGNOSIS — J45909 Unspecified asthma, uncomplicated: Secondary | ICD-10-CM | POA: Insufficient documentation

## 2023-09-23 DIAGNOSIS — Z6841 Body Mass Index (BMI) 40.0 and over, adult: Secondary | ICD-10-CM | POA: Insufficient documentation

## 2023-09-23 DIAGNOSIS — I5032 Chronic diastolic (congestive) heart failure: Secondary | ICD-10-CM | POA: Insufficient documentation

## 2023-09-23 DIAGNOSIS — I130 Hypertensive heart and chronic kidney disease with heart failure and stage 1 through stage 4 chronic kidney disease, or unspecified chronic kidney disease: Secondary | ICD-10-CM | POA: Insufficient documentation

## 2023-09-23 DIAGNOSIS — Z86711 Personal history of pulmonary embolism: Secondary | ICD-10-CM | POA: Insufficient documentation

## 2023-09-23 DIAGNOSIS — E039 Hypothyroidism, unspecified: Secondary | ICD-10-CM | POA: Insufficient documentation

## 2023-09-23 DIAGNOSIS — M1990 Unspecified osteoarthritis, unspecified site: Secondary | ICD-10-CM | POA: Diagnosis not present

## 2023-09-23 DIAGNOSIS — I272 Pulmonary hypertension, unspecified: Secondary | ICD-10-CM | POA: Insufficient documentation

## 2023-09-23 DIAGNOSIS — N189 Chronic kidney disease, unspecified: Secondary | ICD-10-CM | POA: Diagnosis not present

## 2023-09-23 DIAGNOSIS — Z01812 Encounter for preprocedural laboratory examination: Secondary | ICD-10-CM | POA: Diagnosis present

## 2023-09-23 DIAGNOSIS — N202 Calculus of kidney with calculus of ureter: Secondary | ICD-10-CM | POA: Insufficient documentation

## 2023-09-23 DIAGNOSIS — I480 Paroxysmal atrial fibrillation: Secondary | ICD-10-CM | POA: Diagnosis not present

## 2023-09-23 DIAGNOSIS — Z01810 Encounter for preprocedural cardiovascular examination: Secondary | ICD-10-CM | POA: Diagnosis present

## 2023-09-23 DIAGNOSIS — I10 Essential (primary) hypertension: Secondary | ICD-10-CM

## 2023-09-23 DIAGNOSIS — K219 Gastro-esophageal reflux disease without esophagitis: Secondary | ICD-10-CM | POA: Insufficient documentation

## 2023-09-23 HISTORY — DX: Unspecified asthma, uncomplicated: J45.909

## 2023-09-23 HISTORY — DX: Chronic obstructive pulmonary disease, unspecified: J44.9

## 2023-09-23 LAB — CBC
HCT: 39.2 % (ref 36.0–46.0)
Hemoglobin: 12.7 g/dL (ref 12.0–15.0)
MCH: 29.5 pg (ref 26.0–34.0)
MCHC: 32.4 g/dL (ref 30.0–36.0)
MCV: 91 fL (ref 80.0–100.0)
Platelets: 279 K/uL (ref 150–400)
RBC: 4.31 MIL/uL (ref 3.87–5.11)
RDW: 15.8 % — ABNORMAL HIGH (ref 11.5–15.5)
WBC: 10.1 K/uL (ref 4.0–10.5)
nRBC: 0 % (ref 0.0–0.2)

## 2023-09-23 LAB — BASIC METABOLIC PANEL WITH GFR
Anion gap: 9 (ref 5–15)
BUN: 23 mg/dL (ref 8–23)
CO2: 24 mmol/L (ref 22–32)
Calcium: 9.3 mg/dL (ref 8.9–10.3)
Chloride: 108 mmol/L (ref 98–111)
Creatinine, Ser: 1.25 mg/dL — ABNORMAL HIGH (ref 0.44–1.00)
GFR, Estimated: 44 mL/min — ABNORMAL LOW (ref >60)
Glucose, Bld: 107 mg/dL — ABNORMAL HIGH (ref 70–99)
Potassium: 3.7 mmol/L (ref 3.5–5.1)
Sodium: 141 mmol/L (ref 135–145)

## 2023-09-24 ENCOUNTER — Encounter (HOSPITAL_COMMUNITY): Payer: Self-pay

## 2023-09-24 MED ORDER — GENTAMICIN SULFATE 40 MG/ML IJ SOLN
5.0000 mg/kg | INTRAVENOUS | Status: AC
  Administered 2023-09-25: 420 mg via INTRAVENOUS
  Filled 2023-09-24: qty 10.5

## 2023-09-24 NOTE — Anesthesia Preprocedure Evaluation (Signed)
 Anesthesia Evaluation    Airway        Dental   Pulmonary           Cardiovascular hypertension,      Neuro/Psych    GI/Hepatic   Endo/Other    Renal/GU      Musculoskeletal   Abdominal   Peds  Hematology   Anesthesia Other Findings   Reproductive/Obstetrics                              Anesthesia Physical Anesthesia Plan  ASA:   Anesthesia Plan:    Post-op Pain Management:    Induction:   PONV Risk Score and Plan:   Airway Management Planned:   Additional Equipment:   Intra-op Plan:   Post-operative Plan:   Informed Consent:   Plan Discussed with:   Anesthesia Plan Comments: (See PAT note from 7/7)         Anesthesia Quick Evaluation

## 2023-09-24 NOTE — Progress Notes (Signed)
 Case: [id number] Date/Time: 09/25/23 1145   Procedures:      CYSTOSCOPY/URETEROSCOPY/HOLMIUM LASER/STENT PLACEMENT (Bilateral) - FIRST STAGE BILATERAL URETEROSCOPY, RETROGRADE PYELOGRAM, HOLMIUM LASER, STENT     CYSTOSCOPY, WITH RETROGRADE PYELOGRAM (Bilateral) - FIRST STAGE BILATERAL URETEROSCOPY, RETROGRADE PYELOGRAM, HOLMIUM LASER, STENT   Anesthesia type: General   Diagnosis: Calculus of kidney and ureter [N20.2]   Pre-op diagnosis: LARGE BILATERAL KIDNEY STONES   [facility] / [facility]   Surgeons: Shannon Ricardo KATHEE Mickey., MD       DISCUSSION: Shannon Hammond is a 78 yo female who presents to PAT prior to surgery above. PMH of HTN, aortic atherosclerosis, PAF, HFpEF, pulmonary HTN, asthma, hx of DVT/PE (2012, 2017), GERD, hypothyroid, CKD, arthritis, obesity (BMI 43).  Seen by PCP on 7/2 for pre op clearance. Cleared for surgery: Conditions seem to be stable. She has an average risk for this procedure. Can be cleared from a medical standpoint.   Hold Eliquis  at least two days prior to procedure and resume with hemodynamically stable  Patient follows with Dr. Claudene with Cardiology. Unable to obtain notes since office is closed for holiday. 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 Echo in 2017 due to SOB and heart murmur showed normal LVEF 60-65%, mild LVH, grade I DD, moderate TR, moderate pulm HTN.  Seen by Pulmonology remotely in 2016. Advised to treat GERD and asthma. She is prescribed inhalers. She has hx of COPD listed in her medical hx but is a nonsmoker. Does have hx of 2nd hand smoke exposure.  VS: BP (!) 161/82   Pulse 75   Temp 36.7 C (Oral)   Resp 20   Ht 5' 6 (1.676 m)   Wt 122.5 kg   SpO2 95%   BMI 43.58 kg/m   PROVIDERS: Shannon Nottingham, MD   LABS: Labs reviewed: Acceptable for surgery. (all labs ordered are listed, but only abnormal results are displayed)  Labs Reviewed  CBC - Abnormal; Notable for the following components:      Result  Value   RDW 15.8 (*)    All other components within normal limits  BASIC METABOLIC PANEL WITH GFR - Abnormal; Notable for the following components:   Glucose, Bld 107 (*)    Creatinine, Ser 1.25 (*)    GFR, Estimated 44 (*)    All other components within normal limits     IMAGES:   EKG 09/23/23:  Sinus rhythm with occasional Premature ventricular complexes Left axis deviation Cannot rule out Anterolateral infarct , age undetermined  CV:  Echo 02/28/2016:  Study Conclusions  - Left ventricle: The cavity size was normal. There was mild   concentric hypertrophy. Systolic function was normal. The   estimated ejection fraction was in the range of 60% to 65%. Wall   motion was normal; there were no regional wall motion   abnormalities. Doppler parameters are consistent with abnormal   left ventricular relaxation (grade 1 diastolic dysfunction). - Mitral valve: There was mild regurgitation. - Left atrium: The atrium was mildly dilated. - Right ventricle: The cavity size was moderately dilated. Wall   thickness was normal. Systolic function was moderately to   severely reduced. - Tricuspid valve: There was moderate regurgitation. - Pulmonary arteries: Systolic pressure was moderately increased.   PA peak pressure: 51 mm Hg (S).  Impressions:  - Right ventricular dysfunction. Past Medical History:  Diagnosis Date   Anginal pain (HCC)    Arthritis    BACK AND KNEES  Asthma    COPD (chronic obstructive pulmonary disease) (HCC)    DVT (deep venous thrombosis) (HCC) 03/19/2010   RIGHT LEG   GERD (gastroesophageal reflux disease)    Heart murmur    Hemorrhoids    History of kidney stones    History of nocturia    Hypertension    Hypothyroidism    PE (pulmonary embolism)    HX PE 2012 - TX'D IN [place] - HOSPITALIZED X 1 WEEK.   NO LONGER ON BLOOD THINNER OTHER THAN 81 MG ASPIRIN    Seizures (HCC) 03/19/2005   ONLY ONCE - THOUGHT TO BE STRESS INDUCED - PT WAS  DEALING WITH HER MOTHER'S DEATH   Shortness of breath    PT TOLD BY HER MEDICAL DOCTOR - SOME LUNG CHANGES DUE TO 2ND HAND SMOKE - SHE WAS GIVEN INHALER TO USE AS NEEDED.    Past Surgical History:  Procedure Laterality Date   BREAST SURGERY  1993   CYST REMOVED LEFT BREAST   CHOLECYSTECTOMY  LATE 1990'S   CYSTOSCOPY WITH RETROGRADE PYELOGRAM, URETEROSCOPY AND STENT PLACEMENT Right 11/27/2012   Procedure: CYSTOSCOPY WITH RETROGRADE PYELOGRAM, URETEROSCOPY, stone basketry AND STENT PLACEMENT;  Surgeon: Garnette Shack, MD;  Location[facility];  Service: Urology;  Laterality: Right;   HOLMIUM LASER APPLICATION Right 11/27/2012   Procedure: HOLMIUM LASER APPLICATION;  Surgeon: Garnette Shack, MD;  Location: [facility];  Service: Urology;  Laterality: Right;   LITHOTRIPSY     BOTH KIDNEYS    MEDICATIONS:  acetaminophen  (TYLENOL ) 325 MG tablet   alum & mag hydroxide-simeth (MAALOX/MYLANTA) 200-200-20 MG/5ML suspension   apixaban  (ELIQUIS ) 5 MG TABS tablet   B Complex-C (B-COMPLEX WITH VITAMIN C) tablet   budesonide-formoterol  (SYMBICORT) 160-4.5 MCG/ACT inhaler   Cholecalciferol (VITAMIN D3) 50 MCG (2000 UT) TABS   diltiazem  (CARDIZEM  SR) 120 MG 12 hr capsule   furosemide  (LASIX ) 20 MG tablet   furosemide  (LASIX ) 40 MG tablet   gabapentin  (NEURONTIN ) 300 MG capsule   levothyroxine  (SYNTHROID , LEVOTHROID) 88 MCG tablet   magnesium  oxide (MAG-OX) 400 MG tablet   omeprazole (PRILOSEC) 40 MG capsule   ondansetron  (ZOFRAN  ODT) 4 MG disintegrating tablet   trimethoprim (TRIMPEX) 100 MG tablet   VENTOLIN  HFA 108 (90 BASE) MCG/ACT inhaler   No current facility-administered medications for this encounter.    [START ON 09/25/2023] gentamicin  (GARAMYCIN ) 420 mg in dextrose  5 % 100 mL IVPB    Shannon Hammond/[facility] Short Stay/Anesthesiology [facility] Phone [phone] 09/24/2023 11:23 AM

## 2023-09-25 ENCOUNTER — Ambulatory Visit (HOSPITAL_COMMUNITY): Payer: Self-pay | Admitting: Medical

## 2023-09-25 ENCOUNTER — Encounter (HOSPITAL_COMMUNITY): Payer: Self-pay | Admitting: Urology

## 2023-09-25 ENCOUNTER — Ambulatory Visit (HOSPITAL_COMMUNITY): Payer: Self-pay | Admitting: Physician Assistant

## 2023-09-25 ENCOUNTER — Inpatient Hospital Stay (HOSPITAL_COMMUNITY)
Admission: RE | Admit: 2023-09-25 | Discharge: 2023-09-30 | DRG: 987 | Disposition: A | Discharge status: Home / Self Care | Source: Ambulatory Visit | Attending: Internal Medicine | Admitting: Internal Medicine

## 2023-09-25 ENCOUNTER — Encounter (HOSPITAL_COMMUNITY)
Admission: RE | Disposition: A | Discharge status: Home / Self Care | Payer: Self-pay | Source: Ambulatory Visit | Attending: Internal Medicine

## 2023-09-25 ENCOUNTER — Ambulatory Visit (HOSPITAL_COMMUNITY)

## 2023-09-25 ENCOUNTER — Observation Stay (HOSPITAL_COMMUNITY)

## 2023-09-25 ENCOUNTER — Other Ambulatory Visit: Payer: Self-pay

## 2023-09-25 DIAGNOSIS — N202 Calculus of kidney with calculus of ureter: Secondary | ICD-10-CM | POA: Diagnosis not present

## 2023-09-25 DIAGNOSIS — Z86711 Personal history of pulmonary embolism: Secondary | ICD-10-CM

## 2023-09-25 DIAGNOSIS — Z8249 Family history of ischemic heart disease and other diseases of the circulatory system: Secondary | ICD-10-CM | POA: Diagnosis not present

## 2023-09-25 DIAGNOSIS — I5032 Chronic diastolic (congestive) heart failure: Secondary | ICD-10-CM

## 2023-09-25 DIAGNOSIS — Z79899 Other long term (current) drug therapy: Secondary | ICD-10-CM

## 2023-09-25 DIAGNOSIS — I130 Hypertensive heart and chronic kidney disease with heart failure and stage 1 through stage 4 chronic kidney disease, or unspecified chronic kidney disease: Secondary | ICD-10-CM | POA: Diagnosis not present

## 2023-09-25 DIAGNOSIS — Z79890 Hormone replacement therapy (postmenopausal): Secondary | ICD-10-CM | POA: Diagnosis not present

## 2023-09-25 DIAGNOSIS — I5033 Acute on chronic diastolic (congestive) heart failure: Secondary | ICD-10-CM | POA: Diagnosis not present

## 2023-09-25 DIAGNOSIS — E039 Hypothyroidism, unspecified: Secondary | ICD-10-CM | POA: Diagnosis present

## 2023-09-25 DIAGNOSIS — Z7722 Contact with and (suspected) exposure to environmental tobacco smoke (acute) (chronic): Secondary | ICD-10-CM | POA: Diagnosis present

## 2023-09-25 DIAGNOSIS — N200 Calculus of kidney: Principal | ICD-10-CM | POA: Diagnosis present

## 2023-09-25 DIAGNOSIS — J4489 Other specified chronic obstructive pulmonary disease: Secondary | ICD-10-CM | POA: Diagnosis present

## 2023-09-25 DIAGNOSIS — R9389 Abnormal findings on diagnostic imaging of other specified body structures: Secondary | ICD-10-CM | POA: Diagnosis not present

## 2023-09-25 DIAGNOSIS — N1832 Chronic kidney disease, stage 3b: Secondary | ICD-10-CM | POA: Diagnosis present

## 2023-09-25 DIAGNOSIS — I959 Hypotension, unspecified: Secondary | ICD-10-CM | POA: Diagnosis present

## 2023-09-25 DIAGNOSIS — M170 Bilateral primary osteoarthritis of knee: Secondary | ICD-10-CM | POA: Diagnosis present

## 2023-09-25 DIAGNOSIS — E875 Hyperkalemia: Secondary | ICD-10-CM | POA: Diagnosis present

## 2023-09-25 DIAGNOSIS — E1122 Type 2 diabetes mellitus with diabetic chronic kidney disease: Secondary | ICD-10-CM | POA: Diagnosis not present

## 2023-09-25 DIAGNOSIS — N184 Chronic kidney disease, stage 4 (severe): Secondary | ICD-10-CM

## 2023-09-25 DIAGNOSIS — J95821 Acute postprocedural respiratory failure: Principal | ICD-10-CM | POA: Diagnosis present

## 2023-09-25 DIAGNOSIS — J9811 Atelectasis: Secondary | ICD-10-CM | POA: Diagnosis present

## 2023-09-25 DIAGNOSIS — J449 Chronic obstructive pulmonary disease, unspecified: Secondary | ICD-10-CM | POA: Diagnosis not present

## 2023-09-25 DIAGNOSIS — I4892 Unspecified atrial flutter: Secondary | ICD-10-CM | POA: Diagnosis not present

## 2023-09-25 DIAGNOSIS — Z7901 Long term (current) use of anticoagulants: Secondary | ICD-10-CM | POA: Diagnosis not present

## 2023-09-25 DIAGNOSIS — I4819 Other persistent atrial fibrillation: Secondary | ICD-10-CM | POA: Diagnosis present

## 2023-09-25 DIAGNOSIS — I129 Hypertensive chronic kidney disease with stage 1 through stage 4 chronic kidney disease, or unspecified chronic kidney disease: Secondary | ICD-10-CM | POA: Diagnosis not present

## 2023-09-25 DIAGNOSIS — K219 Gastro-esophageal reflux disease without esophagitis: Secondary | ICD-10-CM | POA: Diagnosis present

## 2023-09-25 DIAGNOSIS — J9621 Acute and chronic respiratory failure with hypoxia: Secondary | ICD-10-CM | POA: Diagnosis not present

## 2023-09-25 DIAGNOSIS — E66813 Obesity, class 3: Secondary | ICD-10-CM | POA: Diagnosis present

## 2023-09-25 DIAGNOSIS — J9601 Acute respiratory failure with hypoxia: Secondary | ICD-10-CM

## 2023-09-25 DIAGNOSIS — N172 Acute kidney failure with medullary necrosis: Secondary | ICD-10-CM | POA: Diagnosis not present

## 2023-09-25 DIAGNOSIS — N179 Acute kidney failure, unspecified: Secondary | ICD-10-CM | POA: Diagnosis present

## 2023-09-25 DIAGNOSIS — Z86718 Personal history of other venous thrombosis and embolism: Secondary | ICD-10-CM

## 2023-09-25 DIAGNOSIS — Z87440 Personal history of urinary (tract) infections: Secondary | ICD-10-CM | POA: Diagnosis not present

## 2023-09-25 DIAGNOSIS — R0902 Hypoxemia: Secondary | ICD-10-CM | POA: Diagnosis not present

## 2023-09-25 DIAGNOSIS — Z87442 Personal history of urinary calculi: Secondary | ICD-10-CM

## 2023-09-25 DIAGNOSIS — I480 Paroxysmal atrial fibrillation: Secondary | ICD-10-CM | POA: Diagnosis not present

## 2023-09-25 DIAGNOSIS — R0602 Shortness of breath: Secondary | ICD-10-CM | POA: Diagnosis not present

## 2023-09-25 DIAGNOSIS — I700 Atherosclerosis of aorta: Secondary | ICD-10-CM | POA: Diagnosis present

## 2023-09-25 DIAGNOSIS — M479 Spondylosis, unspecified: Secondary | ICD-10-CM | POA: Diagnosis present

## 2023-09-25 DIAGNOSIS — Z7951 Long term (current) use of inhaled steroids: Secondary | ICD-10-CM | POA: Diagnosis not present

## 2023-09-25 DIAGNOSIS — Z6841 Body Mass Index (BMI) 40.0 and over, adult: Secondary | ICD-10-CM

## 2023-09-25 DIAGNOSIS — Z888 Allergy status to other drugs, medicaments and biological substances status: Secondary | ICD-10-CM

## 2023-09-25 DIAGNOSIS — I517 Cardiomegaly: Secondary | ICD-10-CM | POA: Diagnosis not present

## 2023-09-25 DIAGNOSIS — I10 Essential (primary) hypertension: Secondary | ICD-10-CM | POA: Diagnosis present

## 2023-09-25 DIAGNOSIS — Z9049 Acquired absence of other specified parts of digestive tract: Secondary | ICD-10-CM

## 2023-09-25 HISTORY — DX: Paroxysmal atrial fibrillation: I48.0

## 2023-09-25 HISTORY — DX: Other pulmonary embolism without acute cor pulmonale: I26.99

## 2023-09-25 HISTORY — PX: CYSTOSCOPY/URETEROSCOPY/HOLMIUM LASER/STENT PLACEMENT: SHX6546

## 2023-09-25 HISTORY — PX: CYSTOSCOPY W/ RETROGRADES: SHX1426

## 2023-09-25 SURGERY — CYSTOSCOPY/URETEROSCOPY/HOLMIUM LASER/STENT PLACEMENT
Anesthesia: General | Site: Ureter | Laterality: Bilateral

## 2023-09-25 MED ORDER — ORAL CARE MOUTH RINSE: 15.0000 mL | Freq: Once | OROMUCOSAL | Status: AC

## 2023-09-25 MED ORDER — ACETAMINOPHEN 500 MG PO TABS
1000.0000 mg | ORAL_TABLET | Freq: Three times a day (TID) | ORAL | Status: AC
  Administered 2023-09-25 – 2023-09-26 (×3): 1000 mg via ORAL
  Filled 2023-09-25 (×3): qty 2

## 2023-09-25 MED ORDER — AMISULPRIDE (ANTIEMETIC) 5 MG/2ML IV SOLN
10.0000 mg | Freq: Once | INTRAVENOUS | Status: AC | PRN
  Administered 2023-09-25: 10 mg via INTRAVENOUS

## 2023-09-25 MED ORDER — SENNOSIDES-DOCUSATE SODIUM 8.6-50 MG PO TABS
1.0000 | ORAL_TABLET | Freq: Two times a day (BID) | ORAL | Status: DC
  Administered 2023-09-25 – 2023-09-26 (×3): 1 via ORAL
  Filled 2023-09-25 (×9): qty 1

## 2023-09-25 MED ORDER — TRAMADOL HCL 50 MG PO TABS: 50.0000 mg | ORAL_TABLET | Freq: Four times a day (QID) | ORAL | 0 refills | Status: AC | PRN

## 2023-09-25 MED ORDER — LIDOCAINE HCL (PF) 2 % IJ SOLN
INTRAMUSCULAR | Status: AC
  Filled 2023-09-25: qty 5

## 2023-09-25 MED ORDER — EPHEDRINE 5 MG/ML INJ
INTRAVENOUS | Status: AC
Start: 2023-09-25 — End: 2023-09-25
  Filled 2023-09-25: qty 5

## 2023-09-25 MED ORDER — PHENYLEPHRINE HCL (PRESSORS) 10 MG/ML IV SOLN
INTRAVENOUS | Status: DC | PRN
  Administered 2023-09-25: 80 ug via INTRAVENOUS

## 2023-09-25 MED ORDER — DILTIAZEM HCL ER 60 MG PO CP12: 120.0000 mg | ORAL_CAPSULE | Freq: Two times a day (BID) | ORAL | Status: DC

## 2023-09-25 MED ORDER — IOHEXOL 300 MG/ML  SOLN
INTRAMUSCULAR | Status: DC | PRN
  Administered 2023-09-25: 25 mL

## 2023-09-25 MED ORDER — ESMOLOL HCL 100 MG/10ML IV SOLN
INTRAVENOUS | Status: AC
Start: 2023-09-25 — End: 2023-09-25
  Filled 2023-09-25: qty 10

## 2023-09-25 MED ORDER — FENTANYL CITRATE PF 50 MCG/ML IJ SOSY
PREFILLED_SYRINGE | INTRAMUSCULAR | Status: AC
  Filled 2023-09-25: qty 1

## 2023-09-25 MED ORDER — FENTANYL CITRATE (PF) 100 MCG/2ML IJ SOLN
INTRAMUSCULAR | Status: AC
Start: 2023-09-25 — End: 2023-09-25
  Filled 2023-09-25: qty 2

## 2023-09-25 MED ORDER — DEXAMETHASONE SODIUM PHOSPHATE 10 MG/ML IJ SOLN
INTRAMUSCULAR | Status: AC
  Filled 2023-09-25: qty 1

## 2023-09-25 MED ORDER — APIXABAN 5 MG PO TABS
5.0000 mg | ORAL_TABLET | Freq: Two times a day (BID) | ORAL | Status: DC
  Administered 2023-09-26 (×2): 5 mg via ORAL
  Filled 2023-09-25 (×3): qty 1

## 2023-09-25 MED ORDER — LEVOTHYROXINE SODIUM 88 MCG PO TABS
88.0000 ug | ORAL_TABLET | Freq: Every day | ORAL | Status: DC
  Administered 2023-09-26 – 2023-09-30 (×5): 88 ug via ORAL
  Filled 2023-09-25 (×5): qty 1

## 2023-09-25 MED ORDER — FLUTICASONE FUROATE-VILANTEROL 200-25 MCG/ACT IN AEPB
1.0000 | INHALATION_SPRAY | Freq: Every day | RESPIRATORY_TRACT | Status: DC
  Administered 2023-09-26 – 2023-09-30 (×5): 1 via RESPIRATORY_TRACT
  Filled 2023-09-25: qty 28

## 2023-09-25 MED ORDER — DILTIAZEM HCL ER 60 MG PO CP12
120.0000 mg | ORAL_CAPSULE | Freq: Two times a day (BID) | ORAL | Status: DC
  Administered 2023-09-25 – 2023-09-26 (×2): 120 mg via ORAL
  Filled 2023-09-25 (×7): qty 2

## 2023-09-25 MED ORDER — GENTAMICIN SULFATE 40 MG/ML IJ SOLN: 5.0000 mg/kg | INTRAVENOUS | Status: DC

## 2023-09-25 MED ORDER — PROPOFOL 10 MG/ML IV BOLUS
INTRAVENOUS | Status: AC
Start: 2023-09-25 — End: 2023-09-25
  Filled 2023-09-25: qty 20

## 2023-09-25 MED ORDER — FENTANYL CITRATE PF 50 MCG/ML IJ SOSY
25.0000 ug | PREFILLED_SYRINGE | INTRAMUSCULAR | Status: DC | PRN
  Administered 2023-09-25 (×3): 50 ug via INTRAVENOUS

## 2023-09-25 MED ORDER — FENTANYL CITRATE (PF) 100 MCG/2ML IJ SOLN
INTRAMUSCULAR | Status: DC | PRN
  Administered 2023-09-25: 25 ug via INTRAVENOUS
  Administered 2023-09-25: 75 ug via INTRAVENOUS

## 2023-09-25 MED ORDER — IPRATROPIUM-ALBUTEROL 0.5-2.5 (3) MG/3ML IN SOLN: 3.0000 mL | RESPIRATORY_TRACT | Status: DC | PRN

## 2023-09-25 MED ORDER — ACETAMINOPHEN 10 MG/ML IV SOLN
INTRAVENOUS | Status: AC
  Filled 2023-09-25: qty 100

## 2023-09-25 MED ORDER — PHENYLEPHRINE HCL (PRESSORS) 10 MG/ML IV SOLN
INTRAVENOUS | Status: AC
  Filled 2023-09-25: qty 1

## 2023-09-25 MED ORDER — PHENYLEPHRINE HCL (PRESSORS) 10 MG/ML IV SOLN
INTRAVENOUS | Status: AC
Start: 2023-09-25 — End: 2023-09-25
  Filled 2023-09-25: qty 1

## 2023-09-25 MED ORDER — PROPOFOL 10 MG/ML IV BOLUS
INTRAVENOUS | Status: DC | PRN
  Administered 2023-09-25: 100 mg via INTRAVENOUS
  Administered 2023-09-25: 150 mg via INTRAVENOUS
  Administered 2023-09-25: 50 mg via INTRAVENOUS

## 2023-09-25 MED ORDER — 0.9 % SODIUM CHLORIDE (POUR BTL) OPTIME
TOPICAL | Status: DC | PRN
  Administered 2023-09-25: 1000 mL

## 2023-09-25 MED ORDER — PANTOPRAZOLE SODIUM 40 MG PO TBEC
40.0000 mg | DELAYED_RELEASE_TABLET | Freq: Every day | ORAL | Status: DC
  Administered 2023-09-26 – 2023-09-30 (×5): 40 mg via ORAL
  Filled 2023-09-25 (×5): qty 1

## 2023-09-25 MED ORDER — ROSUVASTATIN CALCIUM 5 MG PO TABS
5.0000 mg | ORAL_TABLET | ORAL | Status: DC
  Administered 2023-09-26: 5 mg via ORAL
  Filled 2023-09-25: qty 1

## 2023-09-25 MED ORDER — TRAMADOL HCL 50 MG PO TABS: 50.0000 mg | ORAL_TABLET | Freq: Four times a day (QID) | ORAL | Status: DC | PRN

## 2023-09-25 MED ORDER — ONDANSETRON HCL 4 MG/2ML IJ SOLN
4.0000 mg | Freq: Once | INTRAMUSCULAR | Status: AC | PRN
  Administered 2023-09-25: 4 mg via INTRAVENOUS

## 2023-09-25 MED ORDER — LACTATED RINGERS IV SOLN: INTRAVENOUS | Status: DC

## 2023-09-25 MED ORDER — ONDANSETRON HCL 4 MG/2ML IJ SOLN
INTRAMUSCULAR | Status: AC
  Filled 2023-09-25: qty 2

## 2023-09-25 MED ORDER — CHLORHEXIDINE GLUCONATE 0.12 % MT SOLN
15.0000 mL | Freq: Once | OROMUCOSAL | Status: AC
Start: 2023-09-25 — End: 2023-09-25
  Administered 2023-09-25: 15 mL via OROMUCOSAL

## 2023-09-25 MED ORDER — SODIUM CHLORIDE 0.9 % IR SOLN
Status: DC | PRN
  Administered 2023-09-25: 3000 mL

## 2023-09-25 MED ORDER — GABAPENTIN 300 MG PO CAPS
300.0000 mg | ORAL_CAPSULE | Freq: Every day | ORAL | Status: DC
  Administered 2023-09-25 – 2023-09-29 (×5): 300 mg via ORAL
  Filled 2023-09-25 (×5): qty 1

## 2023-09-25 MED ORDER — ESMOLOL HCL 100 MG/10ML IV SOLN
INTRAVENOUS | Status: DC | PRN
  Administered 2023-09-25: 10 mg via INTRAVENOUS

## 2023-09-25 MED ORDER — FENTANYL CITRATE PF 50 MCG/ML IJ SOSY
PREFILLED_SYRINGE | INTRAMUSCULAR | Status: AC
  Filled 2023-09-25: qty 2

## 2023-09-25 MED ORDER — LIDOCAINE 2% (20 MG/ML) 5 ML SYRINGE
INTRAMUSCULAR | Status: DC | PRN
  Administered 2023-09-25: 60 mg via INTRAVENOUS

## 2023-09-25 MED ORDER — AMISULPRIDE (ANTIEMETIC) 5 MG/2ML IV SOLN
INTRAVENOUS | Status: AC
  Filled 2023-09-25: qty 4

## 2023-09-25 MED ORDER — ACETAMINOPHEN 10 MG/ML IV SOLN
1000.0000 mg | Freq: Once | INTRAVENOUS | Status: DC | PRN
  Administered 2023-09-25: 1000 mg via INTRAVENOUS

## 2023-09-25 MED ORDER — DEXAMETHASONE SODIUM PHOSPHATE 10 MG/ML IJ SOLN
INTRAMUSCULAR | Status: DC | PRN
Start: 2023-09-25 — End: 2023-09-25
  Administered 2023-09-25: 10 mg via INTRAVENOUS

## 2023-09-25 SURGICAL SUPPLY — 19 items
BAG URO CATCHER STRL LF (MISCELLANEOUS) ×1 IMPLANT
BASKET LASER NITINOL 1.9FR (BASKET) IMPLANT
CATH URETL OPEN END 6FR 70 (CATHETERS) ×1 IMPLANT
CLOTH BEACON ORANGE TIMEOUT ST (SAFETY) ×1 IMPLANT
GLOVE SURG LX STRL 7.5 STRW (GLOVE) ×1 IMPLANT
GOWN STRL REUS W/ TWL XL LVL3 (GOWN DISPOSABLE) ×1 IMPLANT
GUIDEWIRE ANG ZIPWIRE 038X150 (WIRE) ×1 IMPLANT
GUIDEWIRE STR DUAL SENSOR (WIRE) ×1 IMPLANT
KIT TURNOVER KIT A (KITS) ×1 IMPLANT
MANIFOLD NEPTUNE II (INSTRUMENTS) ×1 IMPLANT
NS IRRIG 1000ML POUR BTL (IV SOLUTION) IMPLANT
PACK CYSTO (CUSTOM PROCEDURE TRAY) ×1 IMPLANT
SHEATH NAVIGATOR HD 11/13X28 (SHEATH) IMPLANT
SHEATH NAVIGATOR HD 11/13X36 (SHEATH) IMPLANT
STENT URET 6FRX24 CONTOUR (STENTS) IMPLANT
TRACTIP FLEXIVA PULS ID 200XHI (Laser) IMPLANT
TUBE PU 8FR 16IN ENFIT (TUBING) ×1 IMPLANT
TUBING CONNECTING 10 (TUBING) ×1 IMPLANT
TUBING UROLOGY SET (TUBING) ×1 IMPLANT

## 2023-09-25 NOTE — H&P (Signed)
 Shannon Hammond is an 78 y.o. female.    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 Most recnet clonal CX 2024 proteus sens amp, cipro , gent, bactrim. No febrile pyelo admissions. UCX 08/2023 non-clonal.   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 She is morbidly obese on blood thinners. Skin to stone distance 14cm.   PMH sig for morbid obesity, CHF/Lasix , PE/Eliquus, lap chole. INdependatn in all ADL's lives with husband and daughter. Retired from [employer]. Her PCP is Ryan Hives MD. Her cardiologist is [place].   Today  Shannon Hammond  is seen to proceed with BILATERAL 1st stage ureteroscopy for large volume stones that are likelky colonized. Has been on CX-specific ampicillin pre-op to reduce colonization.   Past Medical History:  Diagnosis Date   Anginal pain (HCC)    Arthritis    BACK AND KNEES   Asthma    COPD (chronic obstructive pulmonary disease) (HCC)    DVT (deep venous thrombosis) (HCC) 03/19/2010   RIGHT LEG   GERD (gastroesophageal reflux disease)    Heart murmur    Hemorrhoids    History of kidney stones    History of nocturia    Hypertension    Hypothyroidism    PE (pulmonary embolism)    HX PE 2012 - TX'D IN [facility]- HOSPITALIZED X 1 WEEK.   NO LONGER ON BLOOD THINNER OTHER THAN 81 MG ASPIRIN    Seizures (HCC) 03/19/2005   ONLY ONCE - THOUGHT TO BE STRESS INDUCED - PT WAS DEALING WITH HER MOTHER'S DEATH   Shortness of breath    PT TOLD BY HER MEDICAL DOCTOR - SOME LUNG CHANGES DUE TO 2ND HAND SMOKE - SHE WAS GIVEN INHALER TO USE AS NEEDED.    Past Surgical History:  Procedure Laterality Date   BREAST SURGERY  1993   CYST REMOVED LEFT BREAST   CHOLECYSTECTOMY  LATE 1990'S   CYSTOSCOPY WITH RETROGRADE PYELOGRAM, URETEROSCOPY AND STENT PLACEMENT  Right 11/27/2012   Procedure: CYSTOSCOPY WITH RETROGRADE PYELOGRAM, URETEROSCOPY, stone basketry AND STENT PLACEMENT;  Surgeon: Garnette Shack, MD;  Location: [facility];  Service: Urology;  Laterality: Right;   HOLMIUM LASER APPLICATION Right 11/27/2012   Procedure: HOLMIUM LASER APPLICATION;  Surgeon: Garnette Shack, MD;  Location: [facility];  Service: Urology;  Laterality: Right;   LITHOTRIPSY     BOTH KIDNEYS    Family History  Problem Relation Age of Onset   Heart attack Father    Clotting disorder Neg Hx    Social History:  reports that she has never smoked. She has never used smokeless tobacco. She reports that she does not drink alcohol and does not use drugs.  Allergies:  Allergies  Allergen Reactions   Mirabegron Itching and Rash    No medications prior to admission.    Results for orders placed or performed during the hospital encounter of 09/23/23 (from the past 48 hours)  CBC per protocol     Status: Abnormal   Collection Time: 09/23/23  9:44 AM  Result Value Ref Range   WBC 10.1 4.0 - 10.5 K/uL   RBC 4.31 3.87 - 5.11 MIL/uL   Hemoglobin 12.7 12.0 - 15.0 g/dL   HCT 60.7 63.9 - 53.9 %   MCV 91.0 80.0 - 100.0 fL   MCH 29.5 26.0 -  34.0 pg   MCHC 32.4 30.0 - 36.0 g/dL   RDW 84.1 (H) 88.4 - 84.4 %   Platelets 279 150 - 400 K/uL   nRBC 0.0 0.0 - 0.2 %    Comment: Performed at [facility], [address], [place], [place] [address]  Basic metabolic panel per protocol     Status: Abnormal   Collection Time: 09/23/23  9:44 AM  Result Value Ref Range   Sodium 141 135 - 145 mmol/L   Potassium 3.7 3.5 - 5.1 mmol/L   Chloride 108 98 - 111 mmol/L   CO2 24 22 - 32 mmol/L   Glucose, Bld 107 (H) 70 - 99 mg/dL    Comment: Glucose reference range applies only to samples taken after fasting for at least 8 hours.   BUN 23 8 - 23 mg/dL   Creatinine, Ser 8.74 (H) 0.44 - 1.00 mg/dL   Calcium  9.3 8.9 - 10.3 mg/dL   GFR, Estimated 44 (L) >60 mL/min    Comment:  (NOTE) Calculated using the CKD-EPI Creatinine Equation (2021)    Anion gap 9 5 - 15    Comment: Performed at [facility], [address], [place], [place] [address]   No results found.  Review of Systems  Constitutional:  Negative for chills and fever.  All other systems reviewed and are negative.   There were no vitals taken for this visit. Physical Exam Vitals reviewed.  HENT:     Head: Normocephalic.  Eyes:     Pupils: Pupils are equal, round, and reactive to light.  Cardiovascular:     Rate and Rhythm: Normal rate.  Pulmonary:     Effort: Pulmonary effort is normal.  Abdominal:     Comments: Stable large truncal obesity limits sensitivity of exam.  Genitourinary:    Comments: No CVAT at present Musculoskeletal:        General: Normal range of motion.     Cervical back: Normal range of motion.  Skin:    General: Skin is warm.  Neurological:     General: No focal deficit present.     Mental Status: She is alert.  Psychiatric:        Mood and Affect: Mood normal.      Assessment/Plan  Proceed as planned with 1st stage BILATERAL ureteroscopy. RIsks, benefits, alternatives, expected peri-op course discussed preivously and reiterated today. Her advanced age and significant metabolic comorbidity does increase peri-op risks, especially infectious.   Ricardo KATHEE Alvaro Mickey., MD 09/25/2023, 6:47 AM

## 2023-09-25 NOTE — Discharge Instructions (Signed)
 1 - You may have urinary urgency (bladder spasms) and bloody urine on / off with stent in place. This is normal.  2 - Call MD or go to ER for fever >102, severe pain / nausea / vomiting not relieved by medications, or acute change in medical status

## 2023-09-25 NOTE — Anesthesia Procedure Notes (Signed)
 Procedure Name: LMA Insertion Date/Time: 09/25/2023 12:00 PM  Performed by: Delores Duwaine SAUNDERS, CRNAPre-anesthesia Checklist: Patient identified, Emergency Drugs available, Suction available and Patient being monitored Patient Re-evaluated:Patient Re-evaluated prior to induction Oxygen Delivery Method: Circle System Utilized Preoxygenation: Pre-oxygenation with 100% oxygen Induction Type: IV induction Ventilation: Mask ventilation without difficulty LMA: LMA inserted LMA Size: 4.0 Number of attempts: 1 Airway Equipment and Method: Bite block Placement Confirmation: positive ETCO2 Tube secured with: Tape Dental Injury: Teeth and Oropharynx as per pre-operative assessment  Comments: No complications

## 2023-09-25 NOTE — Brief Op Note (Signed)
 09/25/2023  12:56 PM  PATIENT:  Shannon Hammond  78 y.o. female  PRE-OPERATIVE DIAGNOSIS:  LARGE BILATERAL KIDNEY STONES  POST-OPERATIVE DIAGNOSIS:  LARGE BILATERAL KIDNEY STONES  PROCEDURE:  Procedure(s) with comments: CYSTOSCOPY/URETEROSCOPY/HOLMIUM LASER/STENT PLACEMENT (Bilateral) - FIRST STAGE RIGHT URETEROSCOPY, LEFT DIAGNOSTIC URETEROSCOPY, BILATERAL RETROGRADE PYELOGRAM, HOLMIUM LASER, RIGHT URETERAL STENT CYSTOSCOPY, WITH RETROGRADE PYELOGRAM (Bilateral) - FIRST STAGE BILATERAL URETEROSCOPY, RETROGRADE PYELOGRAM, HOLMIUM LASER, STENT  SURGEON:  Surgeons and Role:    * Manny, Ricardo KATHEE Raddle., MD - Primary  PHYSICIAN ASSISTANT:   ASSISTANTS: none   ANESTHESIA:   general  EBL:  minimal   BLOOD ADMINISTERED:none  DRAINS: none   LOCAL MEDICATIONS USED:  NONE  SPECIMEN:  Source of Specimen:  Right renal stone fragments  DISPOSITION OF SPECIMEN:  [facility] for compositional analysis  COUNTS:  YES  TOURNIQUET:  * No tourniquets in log *  DICTATION: .Other Dictation: Dictation Number [id number]  PLAN OF CARE: Discharge to home after PACU  PATIENT DISPOSITION:  PACU - hemodynamically stable.   Delay start of Pharmacological VTE agent (>24hrs) due to surgical blood loss or risk of bleeding: yes

## 2023-09-25 NOTE — Transfer of Care (Signed)
 Immediate Anesthesia Transfer of Care Note  Patient: Shannon Hammond  Procedure(s) Performed: CYSTOSCOPY/URETEROSCOPY/HOLMIUM LASER/STENT PLACEMENT (Bilateral: Ureter) CYSTOSCOPY, WITH RETROGRADE PYELOGRAM (Bilateral: Ureter)  Patient Location: PACU  Anesthesia Type:General  Level of Consciousness: drowsy  Airway & Oxygen Therapy: Patient Spontanous Breathing  Post-op Assessment: Report given to RN  Post vital signs: Reviewed and stable  Last Vitals:  Vitals Value Taken Time  BP 150/83 09/25/23 13:06  Temp 36.4 C 09/25/23 13:06  Pulse 72 09/25/23 13:10  Resp 21 09/25/23 13:10  SpO2 93 % 09/25/23 13:10  Vitals shown include unfiled device data.  Last Pain:  Vitals:   09/25/23 1000  TempSrc:   PainSc: 5          Complications: No notable events documented.

## 2023-09-26 ENCOUNTER — Encounter (HOSPITAL_COMMUNITY): Payer: Self-pay | Admitting: Urology

## 2023-09-26 ENCOUNTER — Observation Stay (HOSPITAL_COMMUNITY)

## 2023-09-26 DIAGNOSIS — N200 Calculus of kidney: Secondary | ICD-10-CM | POA: Diagnosis not present

## 2023-09-26 DIAGNOSIS — J9601 Acute respiratory failure with hypoxia: Secondary | ICD-10-CM

## 2023-09-26 DIAGNOSIS — N179 Acute kidney failure, unspecified: Secondary | ICD-10-CM | POA: Diagnosis not present

## 2023-09-26 DIAGNOSIS — I5189 Other ill-defined heart diseases: Secondary | ICD-10-CM | POA: Insufficient documentation

## 2023-09-26 DIAGNOSIS — J9621 Acute and chronic respiratory failure with hypoxia: Secondary | ICD-10-CM | POA: Diagnosis not present

## 2023-09-26 DIAGNOSIS — K219 Gastro-esophageal reflux disease without esophagitis: Secondary | ICD-10-CM | POA: Diagnosis present

## 2023-09-26 DIAGNOSIS — I4892 Unspecified atrial flutter: Secondary | ICD-10-CM | POA: Diagnosis not present

## 2023-09-26 DIAGNOSIS — I5032 Chronic diastolic (congestive) heart failure: Secondary | ICD-10-CM

## 2023-09-26 DIAGNOSIS — N172 Acute kidney failure with medullary necrosis: Secondary | ICD-10-CM | POA: Diagnosis not present

## 2023-09-26 DIAGNOSIS — I10 Essential (primary) hypertension: Secondary | ICD-10-CM | POA: Diagnosis present

## 2023-09-26 DIAGNOSIS — N1832 Chronic kidney disease, stage 3b: Secondary | ICD-10-CM | POA: Diagnosis not present

## 2023-09-26 DIAGNOSIS — E039 Hypothyroidism, unspecified: Secondary | ICD-10-CM | POA: Diagnosis present

## 2023-09-26 DIAGNOSIS — J449 Chronic obstructive pulmonary disease, unspecified: Secondary | ICD-10-CM

## 2023-09-26 DIAGNOSIS — E1122 Type 2 diabetes mellitus with diabetic chronic kidney disease: Secondary | ICD-10-CM | POA: Diagnosis not present

## 2023-09-26 DIAGNOSIS — E66813 Obesity, class 3: Secondary | ICD-10-CM | POA: Diagnosis not present

## 2023-09-26 DIAGNOSIS — I130 Hypertensive heart and chronic kidney disease with heart failure and stage 1 through stage 4 chronic kidney disease, or unspecified chronic kidney disease: Secondary | ICD-10-CM | POA: Diagnosis not present

## 2023-09-26 DIAGNOSIS — I517 Cardiomegaly: Secondary | ICD-10-CM | POA: Diagnosis not present

## 2023-09-26 DIAGNOSIS — I872 Venous insufficiency (chronic) (peripheral): Secondary | ICD-10-CM | POA: Insufficient documentation

## 2023-09-26 DIAGNOSIS — Z86711 Personal history of pulmonary embolism: Secondary | ICD-10-CM | POA: Diagnosis present

## 2023-09-26 DIAGNOSIS — J95821 Acute postprocedural respiratory failure: Secondary | ICD-10-CM | POA: Diagnosis not present

## 2023-09-26 DIAGNOSIS — I5033 Acute on chronic diastolic (congestive) heart failure: Secondary | ICD-10-CM | POA: Diagnosis not present

## 2023-09-26 DIAGNOSIS — J4489 Other specified chronic obstructive pulmonary disease: Secondary | ICD-10-CM | POA: Diagnosis not present

## 2023-09-26 LAB — BASIC METABOLIC PANEL WITH GFR
Anion gap: 14 (ref 5–15)
BUN: 53 mg/dL — ABNORMAL HIGH (ref 8–23)
CO2: 20 mmol/L — ABNORMAL LOW (ref 22–32)
Calcium: 8.7 mg/dL — ABNORMAL LOW (ref 8.9–10.3)
Chloride: 103 mmol/L (ref 98–111)
Creatinine, Ser: 3.62 mg/dL — ABNORMAL HIGH (ref 0.44–1.00)
GFR, Estimated: 12 mL/min — ABNORMAL LOW (ref >60)
Glucose, Bld: 149 mg/dL — ABNORMAL HIGH (ref 70–99)
Potassium: 4.5 mmol/L (ref 3.5–5.1)
Sodium: 137 mmol/L (ref 135–145)

## 2023-09-26 LAB — D-DIMER, QUANTITATIVE: D-Dimer, Quant: >20 ug{FEU}/mL — ABNORMAL HIGH (ref 0.00–0.50)

## 2023-09-26 LAB — CBC WITH DIFFERENTIAL/PLATELET
Abs Immature Granulocytes: 2.5 K/uL — ABNORMAL HIGH (ref 0.00–0.07)
Basophils Absolute: 0 K/uL (ref 0.0–0.1)
Basophils Relative: 0 %
Eosinophils Absolute: 0.1 K/uL (ref 0.0–0.5)
Eosinophils Relative: 0 %
HCT: 37 % (ref 36.0–46.0)
Hemoglobin: 11.4 g/dL — ABNORMAL LOW (ref 12.0–15.0)
Immature Granulocytes: 7 %
Lymphocytes Relative: 2 %
Lymphs Abs: 0.7 K/uL (ref 0.7–4.0)
MCH: 29.5 pg (ref 26.0–34.0)
MCHC: 30.8 g/dL (ref 30.0–36.0)
MCV: 95.9 fL (ref 80.0–100.0)
Monocytes Absolute: 1.8 K/uL — ABNORMAL HIGH (ref 0.1–1.0)
Monocytes Relative: 5 %
Neutro Abs: 32.2 K/uL — ABNORMAL HIGH (ref 1.7–7.7)
Neutrophils Relative %: 86 %
Platelets: 149 K/uL — ABNORMAL LOW (ref 150–400)
RBC: 3.86 MIL/uL — ABNORMAL LOW (ref 3.87–5.11)
RDW: 16.8 % — ABNORMAL HIGH (ref 11.5–15.5)
WBC Morphology: INCREASED
WBC: 37.2 K/uL — ABNORMAL HIGH (ref 4.0–10.5)
nRBC: 0 % (ref 0.0–0.2)

## 2023-09-26 LAB — COMPREHENSIVE METABOLIC PANEL WITH GFR
ALT: 69 U/L — ABNORMAL HIGH (ref 0–44)
AST: 75 U/L — ABNORMAL HIGH (ref 15–41)
Albumin: 3.1 g/dL — ABNORMAL LOW (ref 3.5–5.0)
Alkaline Phosphatase: 117 U/L (ref 38–126)
Anion gap: 13 (ref 5–15)
BUN: 50 mg/dL — ABNORMAL HIGH (ref 8–23)
CO2: 21 mmol/L — ABNORMAL LOW (ref 22–32)
Calcium: 8.9 mg/dL (ref 8.9–10.3)
Chloride: 104 mmol/L (ref 98–111)
Creatinine, Ser: 3.6 mg/dL — ABNORMAL HIGH (ref 0.44–1.00)
GFR, Estimated: 12 mL/min — ABNORMAL LOW (ref >60)
Glucose, Bld: 123 mg/dL — ABNORMAL HIGH (ref 70–99)
Potassium: 5.4 mmol/L — ABNORMAL HIGH (ref 3.5–5.1)
Sodium: 138 mmol/L (ref 135–145)
Total Bilirubin: 1.2 mg/dL (ref 0.0–1.2)
Total Protein: 6.6 g/dL (ref 6.5–8.1)

## 2023-09-26 LAB — MAGNESIUM: Magnesium: 1.7 mg/dL (ref 1.7–2.4)

## 2023-09-26 LAB — TSH: TSH: 1.563 u[IU]/mL (ref 0.350–4.500)

## 2023-09-26 LAB — VITAMIN B12: Vitamin B-12: 421 pg/mL (ref 180–914)

## 2023-09-26 LAB — BRAIN NATRIURETIC PEPTIDE: B Natriuretic Peptide: 1344.6 pg/mL — ABNORMAL HIGH (ref 0.0–100.0)

## 2023-09-26 MED ORDER — FUROSEMIDE 10 MG/ML IJ SOLN
40.0000 mg | Freq: Once | INTRAMUSCULAR | Status: AC
  Administered 2023-09-26: 40 mg via INTRAVENOUS
  Filled 2023-09-26: qty 4

## 2023-09-26 MED ORDER — FUROSEMIDE 10 MG/ML IJ SOLN: 80.0000 mg | Freq: Once | INTRAMUSCULAR | Status: DC

## 2023-09-26 MED ORDER — ALBUTEROL SULFATE (2.5 MG/3ML) 0.083% IN NEBU
2.5000 mg | INHALATION_SOLUTION | RESPIRATORY_TRACT | Status: DC | PRN
  Administered 2023-09-26: 2.5 mg via RESPIRATORY_TRACT
  Filled 2023-09-26: qty 3

## 2023-09-26 MED ORDER — LEVALBUTEROL HCL 0.63 MG/3ML IN NEBU
0.6300 mg | INHALATION_SOLUTION | Freq: Three times a day (TID) | RESPIRATORY_TRACT | Status: DC
  Administered 2023-09-27 – 2023-09-28 (×4): 0.63 mg via RESPIRATORY_TRACT
  Filled 2023-09-26 (×4): qty 3

## 2023-09-26 MED ORDER — LEVALBUTEROL HCL 0.63 MG/3ML IN NEBU
0.6300 mg | INHALATION_SOLUTION | Freq: Four times a day (QID) | RESPIRATORY_TRACT | Status: DC | PRN
  Filled 2023-09-26: qty 3

## 2023-09-26 MED ORDER — CHLORHEXIDINE GLUCONATE CLOTH 2 % EX PADS
6.0000 | MEDICATED_PAD | Freq: Every day | CUTANEOUS | Status: DC
  Administered 2023-09-26 – 2023-09-30 (×5): 6 via TOPICAL

## 2023-09-26 MED ORDER — HEPARIN (PORCINE) 25000 UT/250ML-% IV SOLN
1950.0000 [IU]/h | INTRAVENOUS | Status: DC
  Administered 2023-09-27: 1300 [IU]/h via INTRAVENOUS
  Administered 2023-09-28: 1650 [IU]/h via INTRAVENOUS
  Filled 2023-09-26 (×2): qty 250

## 2023-09-26 MED ORDER — IPRATROPIUM-ALBUTEROL 0.5-2.5 (3) MG/3ML IN SOLN
3.0000 mL | Freq: Four times a day (QID) | RESPIRATORY_TRACT | Status: DC
  Administered 2023-09-26: 3 mL via RESPIRATORY_TRACT
  Filled 2023-09-26: qty 3

## 2023-09-26 NOTE — Care Management Obs Status (Signed)
 MEDICARE OBSERVATION STATUS NOTIFICATION   Patient Details  Name: MAHA FISCHEL MRN: [id number] Date of Birth: Oct 18, 1945   Medicare Observation Status Notification Given:  Yes    Duwaine GORMAN Aran, LCSW 09/26/2023, 3:38 PM

## 2023-09-26 NOTE — Assessment & Plan Note (Addendum)
 78 year old who is POD day 1 s/p bilateral 1st stage ureteroscopy for large volume stones by urology. She has persistent hypoxia last night and in the PACU with atrial fibrillation (known PAF) and was placed on 3L oxygen. We were consulted to manage hypoxia  -differential includes anesthesia in setting of chronic lung disease, acute on chronic diastolic CHF and also RVF, PAF, ?PE as eliquis  was held since Sunday, asthma/COPD flare.  -she is currently satting at 95% on RA for the entire visit of my exam and feels back to baseline except for fatigue/pain  -check CXR (normal yesterday) -check labs including CBC, CMP, BNP, TSH, mag, d-dimer  -She doesn't look volume overloaded or clinically indicative of CHF exacerbation. Will start back home lasix . Echo pending with PAF. Strict I/O.  -She doesn't recall her cardiologist talking about atrial fibrillation, but in afib and rate controlled. Continue eliquis /check echo and labs above on tele.  -check d-dimer since off eliquis , but no leg swelling and no tachycardia. Does have history of large PE. Regardless, she is back on Eliquis . If elevated>v/Q scan  -she has an occasional wheeze on expiration. Schedule duonebs, continue symbicort. Do not think she needs steroids at this time

## 2023-09-26 NOTE — Assessment & Plan Note (Addendum)
 S/p bilateral 1st stage ureteroscopy for large volume stones by urology, Dr. Alvaro on 09/25/2023

## 2023-09-26 NOTE — Assessment & Plan Note (Addendum)
 History of PAF noted in chart since 2017. She states this was during her large PE  She doesn't recall her cardiologist saying anything about this and I can't find this on the summary of the cardiology notes  In atrial fibrillation now, rate around 80-90.  Check TSH/magnesium   Continue eliquis  (CHA2DS2-VASC of 4)  On tele  Strict I/O  Check echo  Continue cardizem  if blood pressure allows  Complains of fatigue, consider zio to monitor burden of PAF

## 2023-09-26 NOTE — Progress Notes (Signed)
   09/26/23 0903  TOC Brief Assessment  Insurance and Status Reviewed  Patient has primary care physician Yes  Home environment has been reviewed Resides in single family home with spouse and daughter  Prior level of function: Independent with ADLs at baseline  Prior/Current Home Services No current home services  Social Drivers of Health Review SDOH reviewed no interventions necessary  Readmission risk has been reviewed Yes  Transition of care needs no transition of care needs at this time

## 2023-09-26 NOTE — Op Note (Signed)
 NAME: Shannon Hammond, [place] MEDICAL RECORD NO: [id number] ACCOUNT NO: [id number] DATE OF BIRTH: [birth year] FACILITY: [facility] LOCATION: [facility] PHYSICIAN: Ricardo Likens, MD  Operative Report   DATE OF PROCEDURE: 09/25/2023  PREOPERATIVE DIAGNOSES:  Right partial staghorn kidney stone, recurrent infections, questionable left renal stone.  PROCEDURES PERFORMED: 1.  Cystoscopy with bilateral retrograde pyelograms, interpretation. 2.  Left diagnostic ureteroscopy. 3.  Right first stage ureteroscopy with laser lithotripsy and stent placement.  ESTIMATED BLOOD LOSS:  Nil.  COMPLICATIONS:  None.  SPECIMENS:  Right renal stone fragments for composition analysis.  FINDINGS: 1.  Unremarkable left retrograde pyelogram. 2.  No evidence of intraluminal stone on the left side whatsoever.  Prior calcification, likely parenchymal versus vascular. 3.  Large right lower pole partial staghorn kidney stone as anticipated.  This was very, very soft. 4.  Estimated ablation of approximately 70% to 80% of right-sided renal stone with first stage procedure. 5.  Successful placement of right ureteral stent, proximal end in renal pelvis, distal end in urinary bladder.  INDICATIONS:  The patient is a pleasant but quite comorbid 78 year old lady with a history of significant urinary tract infections that have been recurrent.  She was found on evaluation of this to have a nonobstructing right partial staghorn kidney stone  as well as some questionable left renal stones that are likely colonized.  Options discussed for management including continued care versus path towards stone-free to hopefully remove her nidus of recurrent infections and reduce her infectious episodes,  and she wished to proceed with the latter.  Given her advanced age, compelling use of anticoagulants for history of PE, it was felt that staged ureteroscopy would be most prudent.  She presents for first stage today.  Informed consent obtained and  placed  in the medical record.  DESCRIPTION OF PROCEDURE:  The patient being verified, procedure being first stage bilateral ureteroscopic stone manipulation was confirmed.  Procedure timeout was performed.  Intravenous antibiotics administered.  General anesthesia was introduced.  The  patient was placed into a low lithotomy position, and her vagina, introitus and proximal thighs were prepped.  Next, cystourethroscopy was performed using 21-French rigid cystoscope with offset lens.  Inspection of urinary bladder revealed no  diverticula, calcifications, papillary lesions.  The left ureteral orifice was cannulated with a 6-French end-hole catheter, and left retrograde pyelogram was obtained.    Left retrograde pyelogram demonstrated a single left ureter and single system left kidney.  No filling defects or narrowing noted.  A 0.038 ZIPwire was advanced to the level of the upper pole, set aside as safety wire.  Next, right retrograde pyelogram  was obtained.  Right retrograde pyelogram demonstrated a single right ureter and single system right kidney.  There was a large filling defect consistent with known partial staghorn kidney stone in the lower pole.  No hydronephrosis.  A 0.038 ZIPwire was advanced to  the level of the upper pole on the right side, set aside as a safety wire.  An 8-French feeding tube placed in the urinary bladder for pressure release.  Next, semirigid ureteroscopy performed on the distal four-fifths of the left ureter alongside a  separate sensor working wire.  No mucosal abnormalities were found.  Next, semirigid ureteroscopy was performed on the distal four-fifths of the right ureter alongside a separate sensor working wire.  There were some mild cystitis cystica changes to the  ureter as anticipated with her chronic infections and stone, otherwise unremarkable.  Next, a short-length ureteral access sheath was placed over  the left sensor working wire to the level of the proximal left  ureter, and the left proximal ureter was  inspected using a flexible digital ureteroscope as well as the left kidney including all calyces x3.  There was no intraluminal stone whatsoever on the left side with very careful inspection.  It was felt the previously visualized calcification likely  corresponded to either a vascular calcification versus parenchymal stone as there were no free-floating stones in the urinary tract whatsoever on the left side.  The access sheath was removed under continuous vision.  No significant mucosal abnormalities  were found.  Next, the access sheath was placed over the right sensor working wire at the level of the proximal right ureter, and flexible digital ureteroscopy was performed on the right side.  The proximal ureter was unremarkable.  Inspection of the  kidney revealed a very large partial staghorn stone occupying approximately the bottom third of the kidney as anticipated.  Holmium laser energy was then applied to the stone initially using settings of 0.2 joules and 40 Hz.  Using a dusting technique,  approximately 50% of the stone was ablated.  The stone was incredibly soft, likely owing to its infectious nature and was quite amenable to this technique.  Representative fragments were grasped with the Escape basket and removed to be set aside for  compositional analysis.  Next, additional ablation was performed using settings of 1 joule and 10 Hz with a popcorn technique, further ablating residual stone fragments as small as possible.  This was performed until visualization was essentially quite  poor due to the amount of stone dust.  It is estimated that we ablated at least 70% to 80% of the stone volume on the right side.  We achieved the goals of the first stage procedure.  Given her large volume of stone, I clearly felt that a second stage  procedure would be warranted as planned as her goal is stone-free.  The access sheath was removed under continuous vision and  a new 6 x 24 Contour type stent was carefully placed on the right side, using fluoroscopic guidance, good proximal and distal  planes were noted.  The procedure was terminated.  The patient tolerated the procedure well, no immediate periprocedural complications.  The patient was taken to the postanesthesia care unit in stable condition.  Plan for discharge home.   SHW D: 09/25/2023 1:02:54 pm T: 09/26/2023 2:30:00 am  JOB: [id number]/ [id number]

## 2023-09-26 NOTE — Assessment & Plan Note (Signed)
 Blood pressure low Hold cardizem  if blood pressure stays low

## 2023-09-26 NOTE — Assessment & Plan Note (Signed)
 Check TSH with hypoxia/fatigue Continue home synthroid  88mcg for now

## 2023-09-26 NOTE — Assessment & Plan Note (Signed)
 Stable, continue to monitor  ?

## 2023-09-26 NOTE — Assessment & Plan Note (Addendum)
 Does not appear volume overloaded Echo 2017 with normal EF, grade 1 DD and moderate to severely reduced RVF  Check echo Strict I/o Continue home lasix 

## 2023-09-26 NOTE — Progress Notes (Signed)
 PHARMACY - ANTICOAGULATION CONSULT NOTE  Pharmacy Consult for heparin  Indication: atrial fibrillation  Allergies  Allergen Reactions   Mirabegron Itching and Rash    Patient Measurements: Height: 5' 6 (167.6 cm) Weight: 124.1 kg (273 lb 9.5 oz) IBW/kg (Calculated) : 59.3 HEPARIN  DW (KG): 89.1  Vital Signs: Temp: 98.5 F (36.9 C) (07/10 2017) Temp Source: Oral (07/10 2017) BP: 100/68 (07/10 2017) Pulse Rate: 119 (07/10 2017)  Labs: Recent Labs    09/26/23 1707  HGB 11.4*  HCT 37.0  PLT 149*  CREATININE 3.60*    Estimated Creatinine Clearance: 17 mL/min (A) (by C-G formula based on SCr of 3.6 mg/dL (H)).   Medical History: Past Medical History:  Diagnosis Date   Anginal pain (HCC)    Arthritis    BACK AND KNEES   Asthma    COPD (chronic obstructive pulmonary disease) (HCC)    DVT (deep venous thrombosis) (HCC) 03/19/2010   RIGHT LEG   GERD (gastroesophageal reflux disease)    Heart murmur    Hemorrhoids    History of kidney stones    History of nocturia    Hypertension    Hypothyroidism    PE (pulmonary embolism)    HX PE 2012 - TX'D IN [facility] [place]- HOSPITALIZED X 1 WEEK.   NO LONGER ON BLOOD THINNER OTHER THAN 81 MG ASPIRIN    Seizures (HCC) 03/19/2005   ONLY ONCE - THOUGHT TO BE STRESS INDUCED - PT WAS DEALING WITH HER MOTHER'S DEATH   Shortness of breath    PT TOLD BY HER MEDICAL DOCTOR - SOME LUNG CHANGES DUE TO 2ND HAND SMOKE - SHE WAS GIVEN INHALER TO USE AS NEEDED.     Assessment: 78 yo female S/p bilateral 1st stage ureteroscopy for large volume stones by urology, currently in Afib, per cards hold apixaban  and start heparin  drip.  LD apixaban  7/10 @ 2113 Hgb 11.4, plts 149, DDimer > 20  Goal of Therapy:  Heparin  level 0.3-0.7 units/ml aPTT 66-102 seconds Monitor platelets by anticoagulation protocol: Yes   Plan:  Start heparin  drip at 1300 units/hr at 0900 tomorrow Aptt 8 hours after starting heparin  Daily CBC and heparin   level  Leeroy Mace RPh 09/26/2023, 10:02 PM

## 2023-09-26 NOTE — Progress Notes (Signed)
 1 Day Post-Op   Subjective/Chief Complaint:  1 - Rt Partial Staghorn Stone In Very Comorbid Lady - s/p 1st stage RIGHT ureteroscopic stone manipulaiton and stent 09/25/23. Stent in place  2 - Acute on Chronic Hypoxemia / Transient Arrythmia - long h/o chronic pulm disease and morbid obesity on inhailers at home. H/o PE but on on eliquus and not stopped peri-op. PT with sats low 80s PACU therefore observed. CV monitoring overnight 7/9 with some AFib with occasional rapid response other times sinus. CXR 7/9 unremarkable.   3 - Disposition - independent in ADL's, lives with husband at baseline, though marginal functional status.  Today Nyia is stable. Continues to have some low sats and transient (asymptomatic) arrhythmia. She admits to some fatigue but otherwise very close to baseline.   Objective: Vital signs in last 24 hours: Temp:  [97.6 F (36.4 C)-98.3 F (36.8 C)] 97.6 F (36.4 C) (07/10 1152) Pulse Rate:  [70-103] 82 (07/10 1152) Resp:  [14-24] 22 (07/10 1152) BP: (91-175)/(42-106) 91/56 (07/10 1152) SpO2:  [83 %-97 %] 93 % (07/10 1152) Weight:  [124.1 kg] 124.1 kg (07/09 2106) Last BM Date : 09/22/23  Intake/Output from previous day: 07/09 0701 - 07/10 0700 In: 1410 [P.O.:480; I.V.:830; IV Piggyback:100] Out: 380 [Urine:375; Blood:5] Intake/Output this shift: Total I/O In: 160 [P.O.:160] Out: -   NAD eating lunch, family at bedside Non-labored breathing on NCO2 HR 70s Stable morbid obesity No foley Minimal LE edema, stable venous stasis changes  Lab Results:  No results for input(s): WBC, HGB, HCT, PLT in the last 72 hours. BMET No results for input(s): NA, K, CL, CO2, GLUCOSE, BUN, CREATININE, CALCIUM  in the last 72 hours. PT/INR No results for input(s): LABPROT, INR in the last 72 hours. ABG No results for input(s): PHART, HCO3 in the last 72 hours.  Invalid input(s): PCO2, PO2  Studies/Results: DG CHEST PORT 1  VIEW Result Date: 09/25/2023 CLINICAL DATA:  Hypoxia. EXAM: PORTABLE CHEST 1 VIEW COMPARISON:  Chest radiograph dated 02/28/2016. FINDINGS: Shallow inspiration with bibasilar atelectasis. No focal consolidation, effusion or pneumothorax. Stable cardiomegaly. No acute osseous pathology. IMPRESSION: 1. No active disease. 2. Cardiomegaly. Electronically Signed   By: Vanetta Chou M.D.   On: 09/25/2023 18:28   DG C-Arm 1-60 Min-No Report Result Date: 09/25/2023 Fluoroscopy was utilized by the requesting physician.  No radiographic interpretation.    Anti-infectives: Anti-infectives (From admission, onward)    Start     Dose/Rate Route Frequency Ordered Stop   09/25/23 0939  gentamicin  (GARAMYCIN ) 5 mg/kg in dextrose  5 % 100 mL IVPB  Status:  Discontinued        5 mg/kg 100 mL/hr over 60 Minutes Intravenous 30 min pre-op 09/25/23 0939 09/25/23 0942   09/25/23 0600  gentamicin  (GARAMYCIN ) 420 mg in dextrose  5 % 100 mL IVPB        5 mg/kg  84.6 kg (Adjusted) 110.5 mL/hr over 60 Minutes Intravenous 30 min pre-op 09/24/23 9247 09/25/23 1235       Assessment/Plan:  Doing well post-op from GU perspective. Will need 2nd stage stone surgeyr as planned in few weeks. Fortunately no worrisome infectious parameters this stay thus far.  IN terms of hypoxemia DDX severe atelectasis, recurrent emboli, etc. Will seek hospitalist consult for help with this, greatly appreciate their support. In meantime conintue home + PRN nebs, minimal IVF.  Pt may very well require home O2 or other for discharge.     Ricardo KATHEE Alvaro Mickey. 09/26/2023

## 2023-09-26 NOTE — Assessment & Plan Note (Signed)
 She has an occasional wheeze/rales in bases, but very occasional. Do not think she is in an acute exacerbation at this time Schedule duonebs, PRN SABA Continue symbicort Low threshold to start steroids if worsening symptoms.

## 2023-09-26 NOTE — Consult Note (Addendum)
 Initial Consultation Note   Patient: Shannon Hammond FMW:[id number] DOB: [birth year] PCP: Clarice Nottingham, MD DOA: 09/25/2023 DOS: the patient was seen and examined on 09/26/2023 Primary service: Alvaro Ricardo KATHEE Mickey., MD  Referring physician: Dr. Alvaro  Reason for consult: hypoxia   Assessment/Plan: Assessment and Plan: Acute respiratory failure with hypoxia [facility]) 78 year old who is POD day 1 s/p bilateral 1st stage ureteroscopy for large volume stones by urology. She has persistent hypoxia last night and in the PACU with atrial fibrillation (known PAF) and was placed on 3L oxygen. We were consulted to manage hypoxia  -differential includes anesthesia in setting of chronic lung disease, acute on chronic diastolic CHF and also RVF, PAF, ?PE as eliquis  was held since Sunday, asthma/COPD flare.  -she is currently satting at 95% on RA for the entire visit of my exam and feels back to baseline except for fatigue/pain  -check CXR (normal yesterday) -check labs including CBC, CMP, BNP, TSH, mag, d-dimer  -She doesn't look volume overloaded or clinically indicative of CHF exacerbation. Will start back home lasix . Echo pending with PAF. Strict I/O.  -She doesn't recall her cardiologist talking about atrial fibrillation, but in afib and rate controlled. Continue eliquis /check echo and labs above on tele.  -check d-dimer since off eliquis , but no leg swelling and no tachycardia. Does have history of large PE. Regardless, she is back on Eliquis . If elevated>v/Q scan  -she has an occasional wheeze on expiration. Schedule duonebs, continue symbicort. Do not think she needs steroids at this time   Staghorn calculus S/p bilateral 1st stage ureteroscopy for large volume stones by urology, Dr. Alvaro on 09/25/2023  Paroxysmal A-fib [facility]) History of PAF noted in chart since 2017. She states this was during her large PE  She doesn't recall her cardiologist saying anything about this and I can't find this on the  summary of the cardiology notes  In atrial fibrillation now, rate around 80-90.  Check TSH/magnesium   Continue eliquis  (CHA2DS2-VASC of 4)  On tele  Strict I/O  Check echo  Continue cardizem  if blood pressure allows  Complains of fatigue, consider zio to monitor burden of PAF   History of pulmonary embolism With hypoxia, check d-dimer On eliquis  If elevated d-dimer: VQ scan   Chronic diastolic CHF and RVF Does not appear volume overloaded Echo 2017 with normal EF, grade 1 DD and moderate to severely reduced RVF  Check echo Strict I/o Continue home lasix   COPD/asthma She has an occasional wheeze/rales in bases, but very occasional. Do not think she is in an acute exacerbation at this time Schedule duonebs, PRN SABA Continue symbicort Low threshold to start steroids if worsening symptoms.   CKD stage 3b, GFR 30-44 ml/min (HCC) Stable, continue to monitor   Essential hypertension Blood pressure low Hold cardizem  if blood pressure stays low  Hypothyroidism Check TSH with hypoxia/fatigue Continue home synthroid  for now   Esophageal reflux Continue PPI     TRH will continue to follow the patient.  HPI: Shannon Hammond is a 78 y.o. female with past medical history of DVT and PE, COPD, GERD, HTN, hypothyroidism, CKD stage III, recurrent cystitis,  PAF, diastolic dysfunction, nephrolithiasis who was admitted to [facility] long by urology due to large volume non obstructive urolithiasis. She underwent bilateral 1st stage ureteroscopy for large volume stones that are likely colonized. She was put on culture specific ampicillin pre-op to reduce colonization. Her eliquis  was stopped Sunday morning in preparation for surgery.   Post  op she had some oxygen saturations in low 80s in PACU and decided to admit her for observation overnight. TRH was consulted ot manage hypoxia. She is on no home oxygen at baseline. She denies any shortness of breath or chest pain. She denies any  palpitations. She has been fatigued at home. She has no worse leg swelling from baseline. No wheezing. She has coughed some since coming out of surgery.  NO abdominal pain, N/V/D. She feels back to baseline.   Oxygen while in room 95%.   Review of Systems: As mentioned in the history of present illness. All other systems reviewed and are negative. Past Medical History:  Diagnosis Date   Anginal pain (HCC)    Arthritis    BACK AND KNEES   Asthma    COPD (chronic obstructive pulmonary disease) (HCC)    DVT (deep venous thrombosis) (HCC) 03/19/2010   RIGHT LEG   GERD (gastroesophageal reflux disease)    Heart murmur    Hemorrhoids    History of kidney stones    History of nocturia    Hypertension    Hypothyroidism    PE (pulmonary embolism)    HX PE 2012 - TX'D IN [place]- HOSPITALIZED X 1 WEEK.   NO LONGER ON BLOOD THINNER OTHER THAN 81 MG ASPIRIN    Seizures (HCC) 03/19/2005   ONLY ONCE - THOUGHT TO BE STRESS INDUCED - PT WAS DEALING WITH HER MOTHER'S DEATH   Shortness of breath    PT TOLD BY HER MEDICAL DOCTOR - SOME LUNG CHANGES DUE TO 2ND HAND SMOKE - SHE WAS GIVEN INHALER TO USE AS NEEDED.   Past Surgical History:  Procedure Laterality Date   BREAST SURGERY  1993   CYST REMOVED LEFT BREAST   CHOLECYSTECTOMY  LATE 1990'S   CYSTOSCOPY W/ RETROGRADES Bilateral 09/25/2023   Procedure: CYSTOSCOPY, WITH RETROGRADE PYELOGRAM;  Surgeon: Alvaro Ricardo KATHEE Mickey., MD;  Location: [facility];  Service: Urology;  Laterality: Bilateral;  FIRST STAGE BILATERAL URETEROSCOPY, RETROGRADE PYELOGRAM, HOLMIUM LASER, STENT   CYSTOSCOPY WITH RETROGRADE PYELOGRAM, URETEROSCOPY AND STENT PLACEMENT Right 11/27/2012   Procedure: CYSTOSCOPY WITH RETROGRADE PYELOGRAM, URETEROSCOPY, stone basketry AND STENT PLACEMENT;  Surgeon: Garnette Shack, MD;  Location: [facility];  Service: Urology;  Laterality: Right;   CYSTOSCOPY/URETEROSCOPY/HOLMIUM LASER/STENT PLACEMENT Bilateral 09/25/2023   Procedure:  CYSTOSCOPY/URETEROSCOPY/HOLMIUM LASER/STENT PLACEMENT;  Surgeon: Alvaro Ricardo KATHEE Mickey., MD;  Location: [facility];  Service: Urology;  Laterality: Bilateral;  FIRST STAGE RIGHT URETEROSCOPY, LEFT DIAGNOSTIC URETEROSCOPY, BILATERAL RETROGRADE PYELOGRAM, HOLMIUM LASER, RIGHT URETERAL STENT   HOLMIUM LASER APPLICATION Right 11/27/2012   Procedure: HOLMIUM LASER APPLICATION;  Surgeon: Garnette Shack, MD;  Location: [facility];  Service: Urology;  Laterality: Right;   LITHOTRIPSY     BOTH KIDNEYS   Social History:  reports that she has never smoked. She has never used smokeless tobacco. She reports that she does not drink alcohol and does not use drugs.  Allergies  Allergen Reactions   Mirabegron Itching and Rash    Family History  Problem Relation Age of Onset   Heart attack Father    Clotting disorder Neg Hx     Prior to Admission medications   Medication Sig Start Date End Date Taking? Authorizing Provider  acetaminophen  (TYLENOL ) 325 MG tablet Take 650 mg by mouth every 6 (six) hours as needed for moderate pain (pain score 4-6).   Yes [provider]  apixaban  (ELIQUIS ) 5 MG TABS tablet Take 1 tablet (5 mg total) by mouth 2 (two)  times daily. 10/30/20  Yes Vann, Jessica U, DO  budesonide-formoterol  (SYMBICORT) 160-4.5 MCG/ACT inhaler Inhale 2 puffs into the lungs 2 (two) times daily.   Yes [provider]  diltiazem  (CARDIZEM  SR) 120 MG 12 hr capsule Take 1 capsule (120 mg total) by mouth 2 (two) times daily. 03/02/16  Yes Rizwan, Saima, MD  furosemide  (LASIX ) 20 MG tablet Take 1 tablet (20 mg total) by mouth daily as needed for edema or fluid. 10/30/20  Yes Vann, Jessica U, DO  furosemide  (LASIX ) 40 MG tablet Take 20 mg by mouth daily.   Yes [provider]  gabapentin  (NEURONTIN ) 300 MG capsule Take 300 mg by mouth at bedtime. 08/27/23  Yes [provider]  levothyroxine  (SYNTHROID , LEVOTHROID) 88 MCG tablet Take 88 mcg by mouth daily before breakfast.   Yes  [provider]  omeprazole (PRILOSEC) 40 MG capsule Take 40 mg by mouth in the morning and at bedtime.   Yes [provider]  oxybutynin (DITROPAN-XL) 10 MG 24 hr tablet Take 10 mg by mouth 1 day or 1 dose.   Yes [provider]  rosuvastatin  (CRESTOR ) 5 MG tablet Take 5 mg by mouth every other day.   Yes [provider]  traMADol  (ULTRAM ) 50 MG tablet Take 1 tablet (50 mg total) by mouth every 6 (six) hours as needed for moderate pain (pain score 4-6) or severe pain (pain score 7-10) (post-operatively). 09/25/23 09/24/24 Yes Manny, Ricardo KATHEE Raddle., MD  trimethoprim (TRIMPEX) 100 MG tablet Take 100 mg by mouth daily. 10/26/20  Yes [provider]  VENTOLIN  HFA 108 (90 BASE) MCG/ACT inhaler Inhale 2 puffs into the lungs every 4 (four) hours as needed for shortness of breath.  09/21/14  Yes [provider]  alum & mag hydroxide-simeth (MAALOX/MYLANTA) 200-200-20 MG/5ML suspension Take 15 mLs by mouth every 6 (six) hours as needed for indigestion or heartburn. Patient not taking: Reported on 09/17/2023 10/30/20   Vann, Jessica U, DO  B Complex-C (B-COMPLEX WITH VITAMIN C) tablet Take 1 tablet by mouth daily.    [provider]  Cholecalciferol (VITAMIN D3) 50 MCG (2000 UT) TABS Take 2,000 Units by mouth daily.    [provider]  magnesium  oxide (MAG-OX) 400 MG tablet Take 400 mg by mouth daily.    [provider]  ondansetron  (ZOFRAN  ODT) 4 MG disintegrating tablet Take 1 tablet (4 mg total) by mouth every 8 (eight) hours as needed for nausea or vomiting. 10/30/20   Juvenal Harlene PENNER, DO    Physical Exam: Vitals:   09/26/23 0934 09/26/23 1152 09/26/23 2017 09/26/23 2057  BP: (!) 93/53 (!) 91/56 100/68   Pulse: (!) 103 82 (!) 119   Resp: (!) 24 (!) 22 18   Temp: 98.2 F (36.8 C) 97.6 F (36.4 C) 98.5 F (36.9 C)   TempSrc: Oral Oral Oral   SpO2: 93% 93% 96% 94%  Weight:      Height:       Physical Exam Vitals reviewed.   Constitutional:      Appearance: Normal appearance. She is obese.  HENT:     Head: Normocephalic and atraumatic.  Neck:     Comments: Edema around bilateral sides of neck/base of clavicles  Cardiovascular:     Rate and Rhythm: Normal rate. Rhythm irregular.     Heart sounds: Normal heart sounds.  Pulmonary:     Effort: Pulmonary effort is normal.     Breath sounds: Wheezing and rhonchi present.  Abdominal:     General: Abdomen is flat. Bowel sounds are normal.     Palpations: Abdomen is soft.  Musculoskeletal:     Cervical back: Normal range of motion and neck supple.  Skin:    General: Skin is warm and dry.     Capillary Refill: Capillary refill takes less than 2 seconds.  Neurological:     General: No focal deficit present.     Mental Status: She is alert and oriented to person, place, and time.  Psychiatric:        Mood and Affect: Mood normal.        Behavior: Behavior normal.     Data Reviewed:   Reviewed     Family Communication: none  Primary team communication: discussed with Dr. Alvaro  Thank you very much for involving us  in the care of your patient.  Author: Isaiah Geralds, MD 09/26/2023 9:27 PM  For on call review [email or web address].

## 2023-09-26 NOTE — Significant Event (Addendum)
 Patient had labs come back with creatinine of 3.65 (1.25 on 7/7), BUN of 50, mildly elevated LFTS and potassium of 5.4.   BNP>1300 and CXR shows pulmonary congestion. Ddimer is >20  Concern for her heart. Cardiology consulted.   Plan -check renal US  and urine studies. Asked nurse to be more adherent with I/O  -check V/Q scan, already on eliquis  that was re started. Cardiology recommending stop eliquis  and start heparin . Foley with 40mg  IV lasix . -check echo -PAF with rate of 120 -repeat bmp at 2200  -transfer to [facility] for cardiology to see  Discussed with patient and nurse.  She is on 2L [place] and comfortable, oxygen 96%.   Dr. Isaiah Geralds, MD  [facility]

## 2023-09-26 NOTE — Assessment & Plan Note (Signed)
 Continue PPI.

## 2023-09-26 NOTE — Assessment & Plan Note (Signed)
 With hypoxia, check d-dimer On eliquis  If elevated d-dimer: VQ scan

## 2023-09-27 ENCOUNTER — Encounter (HOSPITAL_COMMUNITY): Payer: Self-pay | Admitting: Urology

## 2023-09-27 ENCOUNTER — Observation Stay (HOSPITAL_COMMUNITY)

## 2023-09-27 ENCOUNTER — Inpatient Hospital Stay (HOSPITAL_COMMUNITY)

## 2023-09-27 DIAGNOSIS — I5032 Chronic diastolic (congestive) heart failure: Secondary | ICD-10-CM | POA: Diagnosis not present

## 2023-09-27 DIAGNOSIS — I480 Paroxysmal atrial fibrillation: Secondary | ICD-10-CM | POA: Diagnosis not present

## 2023-09-27 DIAGNOSIS — N179 Acute kidney failure, unspecified: Secondary | ICD-10-CM

## 2023-09-27 DIAGNOSIS — I130 Hypertensive heart and chronic kidney disease with heart failure and stage 1 through stage 4 chronic kidney disease, or unspecified chronic kidney disease: Secondary | ICD-10-CM | POA: Diagnosis present

## 2023-09-27 DIAGNOSIS — E039 Hypothyroidism, unspecified: Secondary | ICD-10-CM | POA: Diagnosis present

## 2023-09-27 DIAGNOSIS — J9811 Atelectasis: Secondary | ICD-10-CM | POA: Diagnosis present

## 2023-09-27 DIAGNOSIS — E66813 Obesity, class 3: Secondary | ICD-10-CM | POA: Diagnosis present

## 2023-09-27 DIAGNOSIS — I4892 Unspecified atrial flutter: Secondary | ICD-10-CM | POA: Diagnosis present

## 2023-09-27 DIAGNOSIS — I517 Cardiomegaly: Secondary | ICD-10-CM | POA: Diagnosis not present

## 2023-09-27 DIAGNOSIS — Z7722 Contact with and (suspected) exposure to environmental tobacco smoke (acute) (chronic): Secondary | ICD-10-CM | POA: Diagnosis present

## 2023-09-27 DIAGNOSIS — Z8249 Family history of ischemic heart disease and other diseases of the circulatory system: Secondary | ICD-10-CM | POA: Diagnosis not present

## 2023-09-27 DIAGNOSIS — I959 Hypotension, unspecified: Secondary | ICD-10-CM | POA: Diagnosis present

## 2023-09-27 DIAGNOSIS — N200 Calculus of kidney: Secondary | ICD-10-CM

## 2023-09-27 DIAGNOSIS — I700 Atherosclerosis of aorta: Secondary | ICD-10-CM | POA: Diagnosis present

## 2023-09-27 DIAGNOSIS — R9389 Abnormal findings on diagnostic imaging of other specified body structures: Secondary | ICD-10-CM | POA: Diagnosis not present

## 2023-09-27 DIAGNOSIS — Z79890 Hormone replacement therapy (postmenopausal): Secondary | ICD-10-CM | POA: Diagnosis not present

## 2023-09-27 DIAGNOSIS — I5033 Acute on chronic diastolic (congestive) heart failure: Secondary | ICD-10-CM | POA: Diagnosis present

## 2023-09-27 DIAGNOSIS — E1122 Type 2 diabetes mellitus with diabetic chronic kidney disease: Secondary | ICD-10-CM | POA: Diagnosis present

## 2023-09-27 DIAGNOSIS — R0902 Hypoxemia: Secondary | ICD-10-CM

## 2023-09-27 DIAGNOSIS — Z7901 Long term (current) use of anticoagulants: Secondary | ICD-10-CM | POA: Diagnosis not present

## 2023-09-27 DIAGNOSIS — N1832 Chronic kidney disease, stage 3b: Secondary | ICD-10-CM | POA: Diagnosis present

## 2023-09-27 DIAGNOSIS — I4819 Other persistent atrial fibrillation: Secondary | ICD-10-CM | POA: Diagnosis present

## 2023-09-27 DIAGNOSIS — Z7951 Long term (current) use of inhaled steroids: Secondary | ICD-10-CM | POA: Diagnosis not present

## 2023-09-27 DIAGNOSIS — J4489 Other specified chronic obstructive pulmonary disease: Secondary | ICD-10-CM | POA: Diagnosis present

## 2023-09-27 DIAGNOSIS — Z86718 Personal history of other venous thrombosis and embolism: Secondary | ICD-10-CM | POA: Diagnosis not present

## 2023-09-27 DIAGNOSIS — Z79899 Other long term (current) drug therapy: Secondary | ICD-10-CM | POA: Diagnosis not present

## 2023-09-27 DIAGNOSIS — R0602 Shortness of breath: Secondary | ICD-10-CM | POA: Diagnosis not present

## 2023-09-27 DIAGNOSIS — J95821 Acute postprocedural respiratory failure: Secondary | ICD-10-CM | POA: Diagnosis present

## 2023-09-27 DIAGNOSIS — Z6841 Body Mass Index (BMI) 40.0 and over, adult: Secondary | ICD-10-CM | POA: Diagnosis not present

## 2023-09-27 DIAGNOSIS — E875 Hyperkalemia: Secondary | ICD-10-CM | POA: Diagnosis present

## 2023-09-27 LAB — BASIC METABOLIC PANEL WITH GFR
Anion gap: 14 (ref 5–15)
BUN: 52 mg/dL — ABNORMAL HIGH (ref 8–23)
CO2: 23 mmol/L (ref 22–32)
Calcium: 9.2 mg/dL (ref 8.9–10.3)
Chloride: 103 mmol/L (ref 98–111)
Creatinine, Ser: 3.13 mg/dL — ABNORMAL HIGH (ref 0.44–1.00)
GFR, Estimated: 15 mL/min — ABNORMAL LOW (ref >60)
Glucose, Bld: 132 mg/dL — ABNORMAL HIGH (ref 70–99)
Potassium: 4.7 mmol/L (ref 3.5–5.1)
Sodium: 140 mmol/L (ref 135–145)

## 2023-09-27 LAB — CBC WITH DIFFERENTIAL/PLATELET
Abs Immature Granulocytes: 3.71 K/uL — ABNORMAL HIGH (ref 0.00–0.07)
Basophils Absolute: 0.2 K/uL — ABNORMAL HIGH (ref 0.0–0.1)
Basophils Relative: 1 %
Eosinophils Absolute: 0 K/uL (ref 0.0–0.5)
Eosinophils Relative: 0 %
HCT: 37.3 % (ref 36.0–46.0)
Hemoglobin: 12.1 g/dL (ref 12.0–15.0)
Immature Granulocytes: 9 %
Lymphocytes Relative: 2 %
Lymphs Abs: 0.6 K/uL — ABNORMAL LOW (ref 0.7–4.0)
MCH: 29.2 pg (ref 26.0–34.0)
MCHC: 32.4 g/dL (ref 30.0–36.0)
MCV: 89.9 fL (ref 80.0–100.0)
Monocytes Absolute: 1.9 K/uL — ABNORMAL HIGH (ref 0.1–1.0)
Monocytes Relative: 5 %
Neutro Abs: 33 K/uL — ABNORMAL HIGH (ref 1.7–7.7)
Neutrophils Relative %: 83 %
Platelets: 154 K/uL (ref 150–400)
RBC: 4.15 MIL/uL (ref 3.87–5.11)
RDW: 16.6 % — ABNORMAL HIGH (ref 11.5–15.5)
Smear Review: NORMAL
WBC: 39.4 K/uL — ABNORMAL HIGH (ref 4.0–10.5)
nRBC: 0 % (ref 0.0–0.2)

## 2023-09-27 LAB — ECHOCARDIOGRAM COMPLETE
Area-P 1/2: 5.79 cm2
Height: 66 in
S' Lateral: 3 cm
Weight: 4360 [oz_av]

## 2023-09-27 LAB — COMPREHENSIVE METABOLIC PANEL WITH GFR
ALT: 63 U/L — ABNORMAL HIGH (ref 0–44)
AST: 59 U/L — ABNORMAL HIGH (ref 15–41)
Albumin: 3 g/dL — ABNORMAL LOW (ref 3.5–5.0)
Alkaline Phosphatase: 116 U/L (ref 38–126)
Anion gap: 13 (ref 5–15)
BUN: 52 mg/dL — ABNORMAL HIGH (ref 8–23)
CO2: 24 mmol/L (ref 22–32)
Calcium: 9.2 mg/dL (ref 8.9–10.3)
Chloride: 103 mmol/L (ref 98–111)
Creatinine, Ser: 3.38 mg/dL — ABNORMAL HIGH (ref 0.44–1.00)
GFR, Estimated: 13 mL/min — ABNORMAL LOW (ref >60)
Glucose, Bld: 144 mg/dL — ABNORMAL HIGH (ref 70–99)
Potassium: 5.2 mmol/L — ABNORMAL HIGH (ref 3.5–5.1)
Sodium: 140 mmol/L (ref 135–145)
Total Bilirubin: 0.9 mg/dL (ref 0.0–1.2)
Total Protein: 6.6 g/dL (ref 6.5–8.1)

## 2023-09-27 LAB — MAGNESIUM: Magnesium: 2 mg/dL (ref 1.7–2.4)

## 2023-09-27 LAB — BRAIN NATRIURETIC PEPTIDE: B Natriuretic Peptide: 595.1 pg/mL — ABNORMAL HIGH (ref 0.0–100.0)

## 2023-09-27 LAB — SODIUM, URINE, RANDOM: Sodium, Ur: 74 mmol/L

## 2023-09-27 LAB — CREATININE, URINE, RANDOM: Creatinine, Urine: 36 mg/dL

## 2023-09-27 LAB — APTT: aPTT: 47 s — ABNORMAL HIGH (ref 24–36)

## 2023-09-27 MED ORDER — FUROSEMIDE 10 MG/ML IJ SOLN: 40.0000 mg | Freq: Two times a day (BID) | INTRAMUSCULAR | Status: DC

## 2023-09-27 MED ORDER — AMIODARONE HCL IN DEXTROSE 360-4.14 MG/200ML-% IV SOLN
60.0000 mg/h | INTRAVENOUS | Status: DC
  Administered 2023-09-27 (×2): 60 mg/h via INTRAVENOUS
  Filled 2023-09-27 (×2): qty 200

## 2023-09-27 MED ORDER — TECHNETIUM TO 99M ALBUMIN AGGREGATED
4.0000 | Freq: Once | INTRAVENOUS | Status: AC | PRN
  Administered 2023-09-27: 4 via INTRAVENOUS

## 2023-09-27 MED ORDER — AMIODARONE LOAD VIA INFUSION
150.0000 mg | Freq: Once | INTRAVENOUS | Status: AC
  Administered 2023-09-27: 150 mg via INTRAVENOUS
  Filled 2023-09-27: qty 83.34

## 2023-09-27 MED ORDER — AMIODARONE HCL IN DEXTROSE 360-4.14 MG/200ML-% IV SOLN
30.0000 mg/h | INTRAVENOUS | Status: DC
  Administered 2023-09-27 – 2023-09-29 (×6): 30 mg/h via INTRAVENOUS
  Filled 2023-09-27 (×5): qty 200

## 2023-09-27 MED ORDER — METOPROLOL TARTRATE 5 MG/5ML IV SOLN
5.0000 mg | INTRAVENOUS | Status: DC | PRN
  Administered 2023-09-27: 5 mg via INTRAVENOUS
  Filled 2023-09-27: qty 5

## 2023-09-27 NOTE — Progress Notes (Signed)
   09/27/23 0400  Provider Notification  Provider Name/Title Dr. DOROTHA Pore  Date Provider Notified 09/27/23  Time Provider Notified 0401  Method of Notification Page  Notification Reason Change in status, HR 130-160, Afib RVR, BP remains stable 110/60-123/80 mmHg.  Provider response Evaluate remotely; MD orders Metoprolol  IV 5 mg. Pt responds to Metoprolol  well, HR 110, BP 93/67. MD orders to hold Cardizem  if SBP <100. We will continue to monitor.   Date of Provider Response 09/27/23  Time of Provider Response 0405   Wendi Dash, RN

## 2023-09-27 NOTE — Progress Notes (Signed)
  Pt was transferred from [facility] to [id number] by [facility] staff. Pt is alert and fully oriented x 4, on 3 LPM, normal respiratory effort, afebrile, Afib with RVR on the monitor, BP stable. No distress at arrival. We will continue to monitor.    09/27/23 0206  Vitals  Temp 97.8 F (36.6 C)  Temp Source Oral  BP (!) 118/46  MAP (mmHg) 66  BP Location Left Arm  BP Method Automatic  Patient Position (if appropriate) Lying  Pulse Rate (!) 130  Pulse Rate Source Monitor  ECG Heart Rate (!) 130  Resp 20  Level of Consciousness  Level of Consciousness Alert  MEWS COLOR  MEWS Score Color Yellow  Oxygen Therapy  SpO2 93 %  O2 Device Nasal Cannula  O2 Flow Rate (L/min) 3 L/min  Patient Activity (if Appropriate) In bed  Pulse Oximetry Type Continuous  Pain Assessment  Pain Scale 0-10  Pain Score 0   Wendi Dash, RN

## 2023-09-27 NOTE — Progress Notes (Signed)
 IVT to bedside at 1037 & 1100. RN to re-consult IVT when pt is back on the floor and available.

## 2023-09-27 NOTE — Hospital Course (Signed)
 Patient with PMH of DVT and PE, COPD, HTN, GERD, hypothyroidism, CKD 3B, PAF anticoagulation, chronic HFpEF presented to the hospital at [facility] for management of staghorn calculus.  After her ureteroscopy patient remained hypoxic.  Developed A-fib with RVR as well as AKI on CKD and therefore hospitalist service was consulted. Patient was transferred to [facility] for cardiology consultation and further management as well. Assessment and Plan: Acute respiratory failure with hypoxia Developed persistent hypoxia with atrial fibrillation with RVR post ureteroscopy and was placed on 3L oxygen. Concern for volume overload postprocedure.  Treated with IV Lasix .  There is also concern for PE reoccurrence and patient is on IV heparin . Patient was on oral Eliquis  prior to admission which was held for surgery. VQ scan is ordered currently pending.   Staghorn calculus S/p bilateral 1st stage ureteroscopy for large volume stones by urology, Dr. Alvaro on 09/25/2023 Management per urology. Currently has a Foley catheter.  Urine appears dark in color although no gross hematuria.   Paroxysmal A-fib with RVR. History of PAF noted in chart since 2017. She states this was during her large PE  Appreciate cardiology consultation. Currently on IV amiodarone  with soft blood pressure. Monitor for rate control.   History of pulmonary embolism With hypoxia, VQ scan is ordered. Prior to admission was on on eliquis , now on heparin .   Chronic diastolic CHF and RVF Does not appear volume overloaded Echo 2017 with normal EF, grade 1 DD and moderate to severely reduced RVF  Repeat echo ordered.  Report pending. Holding Lasix  given worsening renal function as well as hypotension.   COPD/asthma No evidence of exacerbation. Continue inhalers.   AKI on CKD 3B. Baseline serum creatinine appears to be around 1.2.  Postprocedure serum creatinine was 3.6.  Improved with IV diuresis to 3.13 therefore there is concern for  cardiorenal hemodynamics. Currently has soft blood pressure and appears to be euvolemic and therefore diuresis on hold. Monitor renal function. I discussed with urology they do not think that the urological procedure would result in AKI.   Essential hypertension Blood pressure low   Hypothyroidism Continue home synthroid  88mcg for now    Esophageal reflux Continue PPI

## 2023-09-27 NOTE — Progress Notes (Addendum)
 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 Stent in place  2 - Acute on Chronic Hypoxemia / Transient Arrythmia - long h/o chronic pulm disease and morbid obesity on inhailers at home. H/o PE but on on eliquus and not stopped peri-op. PT with sats low 80s PACU therefore observed. CV monitoring overnight 7/9 with some AFib with occasional rapid response other times sinus. CXR 7/9 unremarkable.   3 - Disposition - independent in ADL's, lives with husband at baseline, though marginal functional status.  Today Shannon Hammond is stable.  She was undergoing echocardiogram on rounds.  She was in good spirits and fully understood the plan of care.  Objective: Vital signs in last 24 hours: Temp:  [97.6 F (36.4 C)-98.5 F (36.9 C)] 97.7 F (36.5 C) (07/11 0512) Pulse Rate:  [82-139] 110 (07/11 0603) Resp:  [16-24] 20 (07/11 0603) BP: (91-123)/(46-90) 93/67 (07/11 0600) SpO2:  [93 %-96 %] 93 % (07/11 0603) Weight:  [123.6 kg] 123.6 kg (07/11 0206) Last BM Date : 09/26/23  Intake/Output from previous day: 07/10 0701 - 07/11 0700 In: 410 [P.O.:410] Out: 2100 [Urine:2100] Intake/Output this shift: No intake/output data recorded.  Physical Exam HENT:     Head: Normocephalic and atraumatic.  Cardiovascular:     Rate and Rhythm: Tachycardia present. Rhythm irregular.  Abdominal:     General: Abdomen is flat.     Palpations: Abdomen is soft.  Genitourinary:    Comments: 47f foley in place draining lightly blood tinged urine Skin:    General: Skin is warm and dry.  Neurological:     General: No focal deficit present.     Mental Status: She is oriented to person, place, and time. Mental status is at baseline.      Lab Results:  Recent Labs    09/26/23 1707 09/27/23 0335  WBC 37.2* 39.4*  HGB 11.4* 12.1  HCT 37.0 37.3  PLT 149* 154   BMET Recent Labs     09/27/23 0335 09/27/23 0621  NA 140 140  K 5.2* 4.7  CL 103 103  CO2 24 23  GLUCOSE 144* 132*  BUN 52* 52*  CREATININE 3.38* 3.13*  CALCIUM  9.2 9.2   PT/INR No results for input(s): LABPROT, INR in the last 72 hours. ABG No results for input(s): PHART, HCO3 in the last 72 hours.  Invalid input(s): PCO2, PO2  Studies/Results: US  RENAL Result Date: 09/26/2023 CLINICAL DATA:  Acute kidney injury EXAM: RENAL / URINARY TRACT ULTRASOUND COMPLETE COMPARISON:  CT 08/08/2023, ultrasound 06/10/2020 FINDINGS: Right Kidney: Renal measurements: 12 x 6.4 x 4.2 cm = volume: 167.5 mL. Slightly echogenic cortex. No hydronephrosis. Cyst in the upper pole measuring 17 mm, no imaging follow-up is recommended Left Kidney: Renal measurements: 11.2 x 7.4 x 4 cm = volume: 171 mL. Slightly echogenic cortex. No hydronephrosis. No mass. Bladder: Appears normal for degree of bladder distention. Other: None. IMPRESSION: No hydronephrosis. Slightly echogenic renal cortices as can be seen with medical renal disease. Electronically Signed   By: Luke Bun M.D.   On: 09/26/2023 23:55   DG CHEST PORT 1 VIEW Result Date: 09/26/2023 CLINICAL DATA:  Acute on chronic respiratory failure with hypoxia EXAM: PORTABLE CHEST 1 VIEW COMPARISON:  Chest radiograph September 25, 2023 FINDINGS: The heart size is mildly enlarged. Increased bronchovascular markings in perihilar region and right paramediastinal with vascular cephalization indicating pulmonary congestion. No  consolidation or pulmonary nodule. No pleural effusion or pneumothorax. No acute osseous abnormality. IMPRESSION: Pulmonary congestion without definite pleural effusion. Mild cardiomegaly. Electronically Signed   By: Megan  Zare M.D.   On: 09/26/2023 17:22   DG CHEST PORT 1 VIEW Result Date: 09/25/2023 CLINICAL DATA:  Hypoxia. EXAM: PORTABLE CHEST 1 VIEW COMPARISON:  Chest radiograph dated 02/28/2016. FINDINGS: Shallow inspiration with bibasilar atelectasis. No  focal consolidation, effusion or pneumothorax. Stable cardiomegaly. No acute osseous pathology. IMPRESSION: 1. No active disease. 2. Cardiomegaly. Electronically Signed   By: Vanetta Chou M.D.   On: 09/25/2023 18:28   DG C-Arm 1-60 Min-No Report Result Date: 09/25/2023 Fluoroscopy was utilized by the requesting physician.  No radiographic interpretation.    Anti-infectives: Anti-infectives (From admission, onward)    Start     Dose/Rate Route Frequency Ordered Stop   09/25/23 0939  gentamicin  (GARAMYCIN ) 5 mg/kg in dextrose  5 % 100 mL IVPB  Status:  Discontinued        5 mg/kg 100 mL/hr over 60 Minutes Intravenous 30 min pre-op 09/25/23 0939 09/25/23 0942   09/25/23 0600  gentamicin  (GARAMYCIN ) 420 mg in dextrose  5 % 100 mL IVPB        5 mg/kg  84.6 kg (Adjusted) 110.5 mL/hr over 60 Minutes Intravenous 30 min pre-op 09/24/23 0752 09/25/23 1235       Assessment/Plan: 77Y female with Hx of PE/DVT, RV failure, A-fib with RVR in setting of PE, bradycardia, HFpEF, CKD stage IIIb, obesity, COPD, recurrent cystitis, and urolithiasis  #staghorn stone  # A-fib with RVR #AoCKD  Transferred to [facility] for Cardiology consult 7/10.  Appreciate assistance  S/p 1st stage ureteroscopy w/ Dr. Alvaro 7/9. 2nd surgery posted for 7/25.   Transitioning from Eliquis  to heparin  gtt.  Mild hematuria today. Ongoing anticoagulation.   Trend labs, small interval improvement in serum creatinine, felt to be cardiorenal +/- effects of instrumentation.    Opel Lejeune W Sylver Vantassell 09/27/2023

## 2023-09-27 NOTE — Progress Notes (Signed)
 [facility] night cross cover note:   I was notified by the patient's RN that this patient, who is here with atrial fibrillation with RVR, acute kidney injury felt to be cardiorenal in the setting of acute on chronic diastolic heart failure, is currently in atrial fibrillation with RVR, with sustained heart rates in the 120s to 140s.  No new symptoms reported at this time.  Most recent blood pressure 123/80.  Additional vital signs appear stable, including afebrile, respiratory rate 16-20, with oxygen saturation 94 to 95% on 2 L nasal cannula, while noting that the patient has been on 2 to 4 L nasal cannula over the course of the last 24 hours.  The patient has an existing order for diltiazem  120 mg p.o. twice daily, that appears to have missed her evening dose of this medication last night.  I have asked her RN to please give the patient morning dose of oral diltiazem  now, and have added order for as needed IV Lopressor  for sustained heart rates greater than 130 bpm.   In the setting of concern for acute on chronic diastolic heart failure, potentially is a contributory factor leading to her atrial fibrillation with RVR, with chest x-ray showing evidence of increased pulmonary vascular congestion while BNP checked yesterday was elevated to 1344, the patient is noted to have received Lasix  40 mg IV x 1 dose last evening.  I have ordered additional IV Lasix  in the form of 40 mg IV twice daily, with next dose to occur in the morning.  There is an existing order for echocardiogram to be checked in the morning.  I have also ordered an updated BMP to be checked this morning.  Per existing order, CBC this morning is currently pending.  I have also ordered stat BMP and magnesium  levels to be checked at this time.  In the context of an elevated D-dimer, there is an existing order for VQ scan to occur in the morning.  Additionally, per chart review, cardiology has recommended holding of Eliquis  in favor of heparin  drip  while awaiting result of VQ scan.  Existing order for heparin  drip is noted.     Eva Pore, DO Hospitalist

## 2023-09-27 NOTE — Progress Notes (Signed)
 PHARMACY - ANTICOAGULATION CONSULT NOTE  Pharmacy Consult for heparin  Indication: atrial fibrillation  Allergies  Allergen Reactions   Mirabegron Itching and Rash    Patient Measurements: Height: 5' 6 (167.6 cm) Weight: 123.6 kg (272 lb 8 oz) IBW/kg (Calculated) : 59.3 HEPARIN  DW (KG): 89.1  Vital Signs: Temp: 97.6 F (36.4 C) (07/11 1606) Temp Source: Oral (07/11 1606) BP: 105/61 (07/11 1606) Pulse Rate: 110 (07/11 1606)  Labs: Recent Labs    09/26/23 1707 09/26/23 2206 09/27/23 0335 09/27/23 0621 09/27/23 1724  HGB 11.4*  --  12.1  --   --   HCT 37.0  --  37.3  --   --   PLT 149*  --  154  --   --   APTT  --   --   --   --  47*  CREATININE 3.60* 3.62* 3.38* 3.13*  --     Estimated Creatinine Clearance: 19.6 mL/min (A) (by C-G formula based on SCr of 3.13 mg/dL (H)).   Medical History: Past Medical History:  Diagnosis Date   Arthritis    BACK AND KNEES   Asthma    COPD (chronic obstructive pulmonary disease) (HCC)    DVT (deep venous thrombosis) (HCC)    GERD (gastroesophageal reflux disease)    Heart murmur    Hemorrhoids    History of kidney stones    History of nocturia    Hypertension    Hypothyroidism    PAF (paroxysmal atrial fibrillation) (HCC)    Recurrent pulmonary emboli (HCC)    Seizures (HCC) 03/19/2005   ONLY ONCE - THOUGHT TO BE STRESS INDUCED - PT WAS DEALING WITH HER MOTHER'S DEATH   Shortness of breath    PT TOLD BY HER MEDICAL DOCTOR - SOME LUNG CHANGES DUE TO 2ND HAND SMOKE - SHE WAS GIVEN INHALER TO USE AS NEEDED.     Assessment: 78 yo female S/p bilateral 1st stage ureteroscopy for large volume stones by urology, currently in Afib, per cards hold apixaban  and start heparin  drip.  LD apixaban  7/10 @ 2113 aPTT subtherapeutic at 47 while heparin  infusing at 1300 units/hr.   Goal of Therapy:  Heparin  level 0.3-0.7 units/ml aPTT 66-102 seconds Monitor platelets by anticoagulation protocol: Yes   Plan:  Increase heparin  to  1450 units/hr  aPTT 8 hours after dose change Daily CBC and heparin  level  Randall Call, PharmD, BCPS 09/27/23 7:04 PM

## 2023-09-27 NOTE — Progress Notes (Signed)
 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 Stent in place. Renal US  7/10 post-op no hydro.   2 - Acute on Chronic Hypoxemia / Transient Arrythmia - long h/o chronic pulm disease and morbid obesity on inhailers at home. H/o PE  x several. She unfortunately appears to have held her Eliquus (she was to remain on) pre-op. PT with sats low 80s PACU therefore observed. CV monitoring overnight 7/9 with some AFib with occasional rapid response other times sinus. CXR 7/9 unremarkable. BNP 1500. Hosp service + cards consult + transfer to [facility] 7/10 PM.   3 - Disposition - independent in ADL's, lives with husband at baseline, though marginal functional status.  Today Shannon Hammond is stable. Diuresed some for elevated BNP. Echo and VQ pending.    Objective: Vital signs in last 24 hours: Temp:  [97.6 F (36.4 C)-98.5 F (36.9 C)] 97.7 F (36.5 C) (07/11 0512) Pulse Rate:  [82-139] 110 (07/11 0603) Resp:  [16-24] 20 (07/11 0603) BP: (91-123)/(46-90) 93/67 (07/11 0600) SpO2:  [93 %-96 %] 93 % (07/11 0603) Weight:  [123.6 kg] 123.6 kg (07/11 0206) Last BM Date : 09/26/23  Intake/Output from previous day: 07/10 0701 - 07/11 0700 In: 410 [P.O.:410] Out: 2100 [Urine:2100] Intake/Output this shift: No intake/output data recorded.   NAD  Non-labored breathing on NCO2 HR 70s Stable morbid obesity No foley Minimal LE edema, stable venous stasis changes  Lab Results:  Recent Labs    09/26/23 1707 09/27/23 0335  WBC 37.2* 39.4*  HGB 11.4* 12.1  HCT 37.0 37.3  PLT 149* 154   BMET Recent Labs    09/27/23 0335 09/27/23 0621  NA 140 140  K 5.2* 4.7  CL 103 103  CO2 24 23  GLUCOSE 144* 132*  BUN 52* 52*  CREATININE 3.38* 3.13*  CALCIUM  9.2 9.2   PT/INR No results for input(s): LABPROT, INR in the last 72 hours. ABG No results for input(s): PHART, HCO3  in the last 72 hours.  Invalid input(s): PCO2, PO2  Studies/Results: US  RENAL Result Date: 09/26/2023 CLINICAL DATA:  Acute kidney injury EXAM: RENAL / URINARY TRACT ULTRASOUND COMPLETE COMPARISON:  CT 08/08/2023, ultrasound 06/10/2020 FINDINGS: Right Kidney: Renal measurements: 12 x 6.4 x 4.2 cm = volume: 167.5 mL. Slightly echogenic cortex. No hydronephrosis. Cyst in the upper pole measuring 17 mm, no imaging follow-up is recommended Left Kidney: Renal measurements: 11.2 x 7.4 x 4 cm = volume: 171 mL. Slightly echogenic cortex. No hydronephrosis. No mass. Bladder: Appears normal for degree of bladder distention. Other: None. IMPRESSION: No hydronephrosis. Slightly echogenic renal cortices as can be seen with medical renal disease. Electronically Signed   By: Luke Bun M.D.   On: 09/26/2023 23:55   DG CHEST PORT 1 VIEW Result Date: 09/26/2023 CLINICAL DATA:  Acute on chronic respiratory failure with hypoxia EXAM: PORTABLE CHEST 1 VIEW COMPARISON:  Chest radiograph September 25, 2023 FINDINGS: The heart size is mildly enlarged. Increased bronchovascular markings in perihilar region and right paramediastinal with vascular cephalization indicating pulmonary congestion. No consolidation or pulmonary nodule. No pleural effusion or pneumothorax. No acute osseous abnormality. IMPRESSION: Pulmonary congestion without definite pleural effusion. Mild cardiomegaly. Electronically Signed   By: Megan  Zare M.D.   On: 09/26/2023 17:22   DG CHEST PORT 1 VIEW Result Date: 09/25/2023 CLINICAL DATA:  Hypoxia. EXAM: PORTABLE CHEST 1 VIEW COMPARISON:  Chest radiograph dated  02/28/2016. FINDINGS: Shallow inspiration with bibasilar atelectasis. No focal consolidation, effusion or pneumothorax. Stable cardiomegaly. No acute osseous pathology. IMPRESSION: 1. No active disease. 2. Cardiomegaly. Electronically Signed   By: Vanetta Chou M.D.   On: 09/25/2023 18:28   DG C-Arm 1-60 Min-No Report Result Date:  09/25/2023 Fluoroscopy was utilized by the requesting physician.  No radiographic interpretation.    Anti-infectives: Anti-infectives (From admission, onward)    Start     Dose/Rate Route Frequency Ordered Stop   09/25/23 0939  gentamicin  (GARAMYCIN ) 5 mg/kg in dextrose  5 % 100 mL IVPB  Status:  Discontinued        5 mg/kg 100 mL/hr over 60 Minutes Intravenous 30 min pre-op 09/25/23 0939 09/25/23 0942   09/25/23 0600  gentamicin  (GARAMYCIN ) 420 mg in dextrose  5 % 100 mL IVPB        5 mg/kg  84.6 kg (Adjusted) 110.5 mL/hr over 60 Minutes Intravenous 30 min pre-op 09/24/23 9247 09/25/23 1235       Assessment/Plan:  Doing well post-op from GU perspective. Will need 2nd stage stone surgeyr as planned in few weeks. Fortunately no worrisome infectious parameters this stay thus far.  Greatly appreciate hospitalist and cardiology teams management of hypoxia / arrthymia / Rt heart failure. She certainly can remain on eliquus from our perspective and certainly was intended not to stop peri-op.    Shannon Hammond Shannon Hammond. 09/27/2023

## 2023-09-27 NOTE — Consult Note (Addendum)
 Cardiology Consultation   Patient ID: Shannon Hammond MRN: [id number]; DOB: October 28, 1945  Admit date: 09/25/2023 Date of Consult: 09/27/2023  PCP:  Clarice Nottingham, MD   [facility] Providers Cardiologist: Previously Dr. Claudene, wants to switch to [facility] Click here to update MD or APP on Care Team, Refresh:1}     Patient Profile: Shannon Hammond is a 78 y.o. 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 Waddell.  History of Present Illness: Ms. Shamoon previously followed with Dr. Claudene, reportedly last seen 05/2023. We do not have those records. The patient requested to switch to [facility] cardiologist. She was admitted remotely in 2017 with PE/DVT reported to have transient AF RVR at that time. She has been maintained on Eliquis  and diltiazem . Echo showed EF 60-65%, G1DD, moderately dilated RV with moderate-severely reduced RVSF, moderate TR, moderate pHTN. Unsure if repeat echo done in outpatient setting, patient does not think so. She does not recall atrial fibrillation being an issue in recent years - she is on diltiazem  and remembers being taken off metoprolol  due to bradycardia.  She underwent cystoscopy/ureteroscopy with stent placement for large bilateral kidney stones on 09/25/23. Preadmission notes indicate her last dose of Eliquis  was 7/6 AM, patient confirmed this. Overnight 7/9 into 7/10 she was reported to go in and out of PAF/sinus. Eliquis  restarted yesterday. Yesterday the hospitalist team was consulted for episodic hypoxia, was in AF 80s-90s at time of initial eval. Got 120mg  diltiazem  in AM, PM dose held  (suspect due to hypotension). Overnight had issues with RVR, transferred to [facility]. Got 5mg  IV Lopressor  this AM.  BNP 1344, WBC up to 39k, Cr 3.60 (preop Cr 1.25), hyperkalemia 5.4, mild transaminitis, d-dimer >20, TSH OK. VQ scan pending. CXR with pulmonary congestion and mild cardiomegaly. Renal US  with medical renal disease. Received 40mg  IV Lasix  with f/u Cr 3.13, BNP 595, -2.1L UOP. Treated with nebs as well. F/u echo pending. HR remains 120s-140s, SBP 85 -> rechecked 110/70. Thankfully she reports she hasn't actually felt bad through any of this. Denies chest pain, SOB, palpitations, dizziness. Has chronic unchanged DOE for many years. Reports episodic right lower belly pain that radiates to right side of chest relieved with Cola that she attributes to indigestion, but no angina with activities.   Review of tele shows that when she was at [facility] she was in and out of AF/AFL/NSR with brief junctional bradycardia upper 40s on one occasion after conversion from AFL. Overnight she's been in persistent atrial fib. Reports excellent compliance with Eliquis  aside from a) brief delay in mail order several months back prompting 3 days out and b) holding for surgery as above.  Past Medical History:  Diagnosis Date   Arthritis    BACK AND KNEES   Asthma    COPD (chronic obstructive pulmonary disease) (HCC)    DVT (deep venous thrombosis) (HCC)    GERD (gastroesophageal reflux disease)    Heart murmur    Hemorrhoids    History of kidney stones    History of nocturia    Hypertension    Hypothyroidism    PAF (paroxysmal atrial fibrillation) (HCC)    Recurrent pulmonary emboli (HCC)  Seizures (HCC) 03/19/2005   ONLY ONCE - THOUGHT TO BE STRESS INDUCED - PT WAS DEALING WITH HER MOTHER'S DEATH   Shortness of breath    PT TOLD BY HER MEDICAL DOCTOR - SOME LUNG CHANGES DUE TO 2ND HAND SMOKE - SHE WAS GIVEN INHALER TO USE AS NEEDED.    Past Surgical History:  Procedure Laterality Date   BREAST SURGERY   1993   CYST REMOVED LEFT BREAST   CHOLECYSTECTOMY  LATE 1990'S   CYSTOSCOPY W/ RETROGRADES Bilateral 09/25/2023   Procedure: CYSTOSCOPY, WITH RETROGRADE PYELOGRAM;  Surgeon: Alvaro Ricardo KATHEE Mickey., MD;  Location: [facility];  Service: Urology;  Laterality: Bilateral;  FIRST STAGE BILATERAL URETEROSCOPY, RETROGRADE PYELOGRAM, HOLMIUM LASER, STENT   CYSTOSCOPY WITH RETROGRADE PYELOGRAM, URETEROSCOPY AND STENT PLACEMENT Right 11/27/2012   Procedure: CYSTOSCOPY WITH RETROGRADE PYELOGRAM, URETEROSCOPY, stone basketry AND STENT PLACEMENT;  Surgeon: Garnette Shack, MD;  Location: [facility];  Service: Urology;  Laterality: Right;   CYSTOSCOPY/URETEROSCOPY/HOLMIUM LASER/STENT PLACEMENT Bilateral 09/25/2023   Procedure: CYSTOSCOPY/URETEROSCOPY/HOLMIUM LASER/STENT PLACEMENT;  Surgeon: Alvaro Ricardo KATHEE Mickey., MD;  Location: [facility];  Service: Urology;  Laterality: Bilateral;  FIRST STAGE RIGHT URETEROSCOPY, LEFT DIAGNOSTIC URETEROSCOPY, BILATERAL RETROGRADE PYELOGRAM, HOLMIUM LASER, RIGHT URETERAL STENT   HOLMIUM LASER APPLICATION Right 11/27/2012   Procedure: HOLMIUM LASER APPLICATION;  Surgeon: Garnette Shack, MD;  Location: [facility];  Service: Urology;  Laterality: Right;   LITHOTRIPSY     BOTH KIDNEYS     Home Medications:  Prior to Admission medications   Medication Sig Start Date End Date Taking? Authorizing Provider  acetaminophen  (TYLENOL ) 325 MG tablet Take 650 mg by mouth every 6 (six) hours as needed for moderate pain (pain score 4-6).   Yes [provider]  apixaban  (ELIQUIS ) 5 MG TABS tablet Take 1 tablet (5 mg total) by mouth 2 (two) times daily. 10/30/20  Yes Vann, Jessica U, DO  budesonide-formoterol  (SYMBICORT) 160-4.5 MCG/ACT inhaler Inhale 2 puffs into the lungs 2 (two) times daily.   Yes [provider]  diltiazem  (CARDIZEM  SR) 120 MG 12 hr capsule Take 1 capsule (120 mg total) by mouth 2 (two) times daily. 03/02/16  Yes Rizwan, Saima, MD  furosemide  (LASIX ) 20 MG tablet Take 1  tablet (20 mg total) by mouth daily as needed for edema or fluid. 10/30/20  Yes Vann, Jessica U, DO  furosemide  (LASIX ) 40 MG tablet Take 20 mg by mouth daily.   Yes [provider]  gabapentin  (NEURONTIN ) 300 MG capsule Take 300 mg by mouth at bedtime. 08/27/23  Yes [provider]  levothyroxine  (SYNTHROID , LEVOTHROID) 88 MCG tablet Take 88 mcg by mouth daily before breakfast.   Yes [provider]  omeprazole (PRILOSEC) 40 MG capsule Take 40 mg by mouth in the morning and at bedtime.   Yes [provider]  oxybutynin (DITROPAN-XL) 10 MG 24 hr tablet Take 10 mg by mouth 1 day or 1 dose.   Yes [provider]  rosuvastatin  (CRESTOR ) 5 MG tablet Take 5 mg by mouth every other day.   Yes [provider]  traMADol  (ULTRAM ) 50 MG tablet Take 1 tablet (50 mg total) by mouth every 6 (six) hours as needed for moderate pain (pain score 4-6) or severe pain (pain score 7-10) (post-operatively). 09/25/23 09/24/24 Yes Manny, Ricardo KATHEE Mickey., MD  trimethoprim (TRIMPEX) 100 MG tablet Take 100 mg by mouth daily. 10/26/20  Yes [provider]  VENTOLIN  HFA 108 (90 BASE) MCG/ACT inhaler Inhale 2 puffs into the lungs every  4 (four) hours as needed for shortness of breath.  09/21/14  Yes [provider]  alum & mag hydroxide-simeth (MAALOX/MYLANTA) 200-200-20 MG/5ML suspension Take 15 mLs by mouth every 6 (six) hours as needed for indigestion or heartburn. Patient not taking: Reported on 09/17/2023 10/30/20   Vann, Jessica U, DO  B Complex-C (B-COMPLEX WITH VITAMIN C) tablet Take 1 tablet by mouth daily.    [provider]  Cholecalciferol (VITAMIN D3) 50 MCG (2000 UT) TABS Take 2,000 Units by mouth daily.    [provider]  magnesium  oxide (MAG-OX) 400 MG tablet Take 400 mg by mouth daily.    [provider]  ondansetron  (ZOFRAN  ODT) 4 MG disintegrating tablet Take 1 tablet (4 mg total) by mouth every 8 (eight) hours as needed for  nausea or vomiting. 10/30/20   Vann, Jessica U, DO    Scheduled Meds:  Chlorhexidine  Gluconate Cloth  6 each Topical Daily   fluticasone  furoate-vilanterol  1 puff Inhalation Daily   furosemide   40 mg Intravenous BID   gabapentin   300 mg Oral QHS   levalbuterol   0.63 mg Nebulization TID   levothyroxine   88 mcg Oral Q0600   pantoprazole   40 mg Oral Daily   rosuvastatin   5 mg Oral QODAY   senna-docusate  1 tablet Oral BID   Continuous Infusions:  heparin      PRN Meds: levalbuterol , metoprolol  tartrate, traMADol   Allergies:    Allergies  Allergen Reactions   Mirabegron Itching and Rash    Social History:   Social History   Socioeconomic History   Marital status: Married    Spouse name: Not on file   Number of children: Not on file   Years of education: Not on file   Highest education level: Not on file  Occupational History   Not on file  Tobacco Use   Smoking status: Never   Smokeless tobacco: Never  Vaping Use   Vaping status: Never Used  Substance and Sexual Activity   Alcohol use: No   Drug use: No   Sexual activity: Not on file  Other Topics Concern   Not on file  Social History Narrative   Not on file   Social Drivers of Health   Financial Resource Strain: Not on file  Food Insecurity: No Food Insecurity (09/25/2023)   Hunger Vital Sign    Worried About Running Out of Food in the Last Year: Never true    Ran Out of Food in the Last Year: Never true  Transportation Needs: No Transportation Needs (09/25/2023)   PRAPARE - Administrator, Civil Service (Medical): No    Lack of Transportation (Non-Medical): No  Physical Activity: Not on file  Stress: Not on file  Social Connections: Socially Integrated (09/25/2023)   Social Connection and Isolation Panel    Frequency of Communication with Friends and Family: Three times a week    Frequency of Social Gatherings with Friends and Family: Three times a week    Attends Religious Services: More than 4  times per year    Active Member of Clubs or Organizations: Yes    Attends Banker Meetings: More than 4 times per year    Marital Status: Married  Catering manager Violence: Not At Risk (09/25/2023)   Humiliation, Afraid, Rape, and Kick questionnaire    Fear of Current or Ex-Partner: No    Emotionally Abused: No    Physically Abused: No    Sexually Abused: No  Family History:   Family History  Problem Relation Age of Onset   Heart attack Father    Clotting disorder Neg Hx      ROS:  Please see the history of present illness.  All other ROS reviewed and negative.     Physical Exam/Data: Vitals:   09/27/23 0512 09/27/23 0528 09/27/23 0600 09/27/23 0603  BP: 102/83 110/60 93/67   Pulse: (!) 139 (!) 132 (!) 120 (!) 110  Resp: 20 17 20 20   Temp: 97.7 F (36.5 C)     TempSrc: Oral     SpO2: 93% 93% 94% 93%  Weight:      Height:        Intake/Output Summary (Last 24 hours) at 09/27/2023 0756 Last data filed at 09/27/2023 0530 Gross per 24 hour  Intake 410 ml  Output 2100 ml  Net -1690 ml      09/27/2023    2:06 AM 09/25/2023    9:06 PM 09/23/2023    9:10 AM  Last 3 Weights  Weight (lbs) 272 lb 8 oz 273 lb 9.5 oz 270 lb  Weight (kg) 123.605 kg 124.1 kg 122.471 kg     Body mass index is 43.98 kg/m.  General: Well developed, well nourished, in no acute distress. Head: Normocephalic, atraumatic, sclera non-icteric, no xanthomas, nares are without discharge. Neck: Negative for carotid bruits. JVP not elevated. Lungs: Coarse bilaterally to auscultation without wheezes, rales, or rhonchi. Breathing is unlabored. Heart: Irr irr, tachcyardic S1 S2 without murmurs, rubs, or gallops.  Abdomen: Soft, non-tender, non-distended with normoactive bowel sounds. No rebound/guarding. Extremities: No clubbing or cyanosis. Mild sockline edema superimposed on large baseline leg habitus. Distal pedal pulses are 2+ and equal bilaterally. Neuro: Alert and oriented X 3. Moves all  extremities spontaneously. Psych:  Responds to questions appropriately with a normal affect.    EKG:  The EKG was personally reviewed and demonstrates:    09/23/23 preop: significant artifact, NSR 64bpm with suspected PVC, possible nonspecific STTW changes, nonacute 09/26/23 atrial fibrillation 92bpm, one PVC, nonspecific STT changes  Telemetry:  Telemetry was personally reviewed and demonstrates:  outlined above  Relevant CV Studies:  2D echo 2017  - Left ventricle: The cavity size was normal. There was mild    concentric hypertrophy. Systolic function was normal. The    estimated ejection fraction was in the range of 60% to 65%. Wall    motion was normal; there were no regional wall motion    abnormalities. Doppler parameters are consistent with abnormal    left ventricular relaxation (grade 1 diastolic dysfunction).  - Mitral valve: There was mild regurgitation.  - Left atrium: The atrium was mildly dilated.  - Right ventricle: The cavity size was moderately dilated. Wall    thickness was normal. Systolic function was moderately to    severely reduced.  - Tricuspid valve: There was moderate regurgitation.  - Pulmonary arteries: Systolic pressure was moderately increased.    PA peak pressure: 51 mm Hg (S).  Impressions:  - Right ventricular dysfunction.   Laboratory Data: High Sensitivity Troponin:  No results for input(s): TROPONINIHS in the last 720 hours.   Chemistry Recent Labs  Lab 09/26/23 1707 09/26/23 2206 09/27/23 0621  NA 138 137 140  K 5.4* 4.5 4.7  CL 104 103 103  CO2 21* 20* 23  GLUCOSE 123* 149* 132*  BUN 50* 53* 52*  CREATININE 3.60* 3.62* 3.13*  CALCIUM  8.9 8.7* 9.2  MG 1.7  --  2.0  GFRNONAA 12* 12* 15*  ANIONGAP 13 14 14     Recent Labs  Lab 09/26/23 1707  PROT 6.6  ALBUMIN  3.1*  AST 75*  ALT 69*  ALKPHOS 117  BILITOT 1.2   Lipids No results for input(s): CHOL, TRIG, HDL, LABVLDL, LDLCALC, CHOLHDL in the last 168 hours.   Hematology Recent Labs  Lab 09/23/23 0944 09/26/23 1707 09/27/23 0335  WBC 10.1 37.2* 39.4*  RBC 4.31 3.86* 4.15  HGB 12.7 11.4* 12.1  HCT 39.2 37.0 37.3  MCV 91.0 95.9 89.9  MCH 29.5 29.5 29.2  MCHC 32.4 30.8 32.4  RDW 15.8* 16.8* 16.6*  PLT 279 149* 154   Thyroid   Recent Labs  Lab 09/26/23 1707  TSH 1.563    BNP Recent Labs  Lab 09/26/23 1707 09/27/23 0621  BNP 1,344.6* 595.1*    DDimer  Recent Labs  Lab 09/26/23 1707  DDIMER >20.00*    Radiology/Studies:  US  RENAL Result Date: 09/26/2023 CLINICAL DATA:  Acute kidney injury EXAM: RENAL / URINARY TRACT ULTRASOUND COMPLETE COMPARISON:  CT 08/08/2023, ultrasound 06/10/2020 FINDINGS: Right Kidney: Renal measurements: 12 x 6.4 x 4.2 cm = volume: 167.5 mL. Slightly echogenic cortex. No hydronephrosis. Cyst in the upper pole measuring 17 mm, no imaging follow-up is recommended Left Kidney: Renal measurements: 11.2 x 7.4 x 4 cm = volume: 171 mL. Slightly echogenic cortex. No hydronephrosis. No mass. Bladder: Appears normal for degree of bladder distention. Other: None. IMPRESSION: No hydronephrosis. Slightly echogenic renal cortices as can be seen with medical renal disease. Electronically Signed   By: Luke Bun M.D.   On: 09/26/2023 23:55   DG CHEST PORT 1 VIEW Result Date: 09/26/2023 CLINICAL DATA:  Acute on chronic respiratory failure with hypoxia EXAM: PORTABLE CHEST 1 VIEW COMPARISON:  Chest radiograph September 25, 2023 FINDINGS: The heart size is mildly enlarged. Increased bronchovascular markings in perihilar region and right paramediastinal with vascular cephalization indicating pulmonary congestion. No consolidation or pulmonary nodule. No pleural effusion or pneumothorax. No acute osseous abnormality. IMPRESSION: Pulmonary congestion without definite pleural effusion. Mild cardiomegaly. Electronically Signed   By: Megan  Zare M.D.   On: 09/26/2023 17:22   DG CHEST PORT 1 VIEW Result Date: 09/25/2023 CLINICAL DATA:   Hypoxia. EXAM: PORTABLE CHEST 1 VIEW COMPARISON:  Chest radiograph dated 02/28/2016. FINDINGS: Shallow inspiration with bibasilar atelectasis. No focal consolidation, effusion or pneumothorax. Stable cardiomegaly. No acute osseous pathology. IMPRESSION: 1. No active disease. 2. Cardiomegaly. Electronically Signed   By: Vanetta Chou M.D.   On: 09/25/2023 18:28   DG C-Arm 1-60 Min-No Report Result Date: 09/25/2023 Fluoroscopy was utilized by the requesting physician.  No radiographic interpretation.     Assessment and Plan:  1. Post-op cystoscopy/ureteroscopy with stent placement for large bilateral kidney stones on 09/25/23, complicated by AKI on CKD 3b, hyperkalemia, acute respiratory failure with hypoxia requiring supplemental O2, markedly elevated d-dimer in setting of prior recurrent PE/DVT (at least 2 events), with recent Eliquis  interruption prior to surgery - VQ scan pending - high risk for recurrent PE - s/p 1 dose IV Lasix  with downtrending BNP-  IM has held further doses of Lasix , agree - abx/uro/nephro mgmt per primary teams - satting well on supplemental O2, patient appears comfortable  2. Paroxysmal atrial fibrillation/flutter with RVR, brief post-conversion junctional bradycardia - dx 2017 in setting of PE, sounds isolated to that time - primarily continued on OAC due to recurrent VTE events - back in persistent AF RVR overnight - PM diltiazem  dose held due to  hypotension, multiple medical stressors likely contributing to presentation. IV metoprolol  did not produce any lasting effect. Given tachycardia, paroxysmal nature of arrhythmia, and hypotension, will start IV amiodarone  and hold further diltiazem  pending BP trends. Need to watch for bradycardia given history of such - TSH wnl - on heparin  per pharmacy now given acute issues  3. Acute on chronic HFpEF - was on Lasix  PTA, received 40mg  x1 dose last night with Cr 3.6->3.13, agree holding on further dosing given borderline  hypotension at times - follow echo report (hx RV dysfunction in 2017 in setting of PE) - not a candidate for SGLT2i with h/o recurrent cystitis  3. HTN with hypotension - manage in context above, last BP 110/70  4. Hypothyroidism - TSH wnl  5. Aortic atherosclerosis  - defer statin resumption when acute issues resolve  6. Morbid obesity - consider OP sleep study if not yet obtained  Risk Assessment/Risk Scores:       New York  Heart Association (NYHA) Functional Class NYHA Class III  CHA2DS2-VASc Score = 6   This indicates a 9.7% annual risk of stroke. The patient's score is based upon: CHF History: 1 HTN History: 1 Diabetes History: 0 Stroke History: 0 Vascular Disease History: 1 (aortic atherosclerosis) Age Score: 2 Gender Score: 1      For questions or updates, please contact [facility] Please consult [email or web address] for contact info under    Signed, Emrey Thornley N Monteen Toops, PA-C  09/27/2023 7:56 AM

## 2023-09-27 NOTE — Progress Notes (Signed)
 [facility] Consultation Progress Note  Patient: Shannon Hammond FMW:[id number]   PCP: Clarice Nottingham, MD DOB: 01/09/1946   DOA: 09/25/2023   DOS: 09/27/2023   Date of Service: the patient was seen and examined on 09/27/2023 Primary service: Tobie Yetta HERO, MD   Brief Hospital Course: Patient with PMH of DVT and PE, COPD, HTN, GERD, hypothyroidism, CKD 3B, PAF anticoagulation, chronic HFpEF presented to the hospital at [facility] for management of staghorn calculus.  After her ureteroscopy patient remained hypoxic.  Developed A-fib with RVR as well as AKI on CKD and therefore hospitalist service was consulted. Patient was transferred to [facility] for cardiology consultation and further management as well. Assessment and Plan: Acute respiratory failure with hypoxia Developed persistent hypoxia with atrial fibrillation with RVR post ureteroscopy and was placed on 3L oxygen. Concern for volume overload postprocedure.  Treated with IV Lasix .  There is also concern for PE reoccurrence and patient is on IV heparin . Patient was on oral Eliquis  prior to admission which was held for surgery. VQ scan is ordered currently pending.   Staghorn calculus S/p bilateral 1st stage ureteroscopy for large volume stones by urology, Dr. Alvaro on 09/25/2023 Management per urology. Currently has a Foley catheter.  Urine appears dark in color although no gross hematuria.   Paroxysmal A-fib with RVR. History of PAF noted in chart since 2017. She states this was during her large PE  Appreciate cardiology consultation. Currently on IV amiodarone  with soft blood pressure. Monitor for rate control.   History of pulmonary embolism With hypoxia, VQ scan is ordered. Prior to admission was on on eliquis , now on heparin .   Chronic diastolic CHF and RVF Does not appear volume overloaded Echo 2017 with normal EF, grade 1 DD and moderate to severely reduced RVF  Repeat echo ordered.  Report pending. Holding Lasix  given  worsening renal function as well as hypotension.   COPD/asthma No evidence of exacerbation. Continue inhalers.   AKI on CKD 3B. Baseline serum creatinine appears to be around 1.2.  Postprocedure serum creatinine was 3.6.  Improved with IV diuresis to 3.13 therefore there is concern for cardiorenal hemodynamics. Currently has soft blood pressure and appears to be euvolemic and therefore diuresis on hold. Monitor renal function. I discussed with urology they do not think that the urological procedure would result in AKI.   Essential hypertension Blood pressure low   Hypothyroidism Continue home synthroid  88mcg for now    Esophageal reflux Continue PPI    At present given patient's current issues are primarily medical in nature, hospital service will take over the patient for ongoing medical care. This was discussed with primary team.  Subjective: Breathing improving.  No nausea no vomiting.  No fever no chills.  Reports fatigue and tiredness.  Objective: Vitals:   09/27/23 1009 09/27/23 1128 09/27/23 1413 09/27/23 1606  BP: 95/60 104/62 107/83 105/61  Pulse: (!) 125 84  (!) 110  Resp: 20 20 16 20   Temp:  98 F (36.7 C)  97.6 F (36.4 C)  TempSrc:  Oral  Oral  SpO2: 94% 90% 94% 95%  Weight:      Height:        Basal crackles.  No wheezing. S1-S2 present. Bowel sounds present.  Nontender. No significant edema. Alert awake and oriented.  Family Communication: No one at bedside  Data Reviewed: Since last encounter, pertinent lab results CBC and BMP   . I have ordered test including CBC and BMP  . I have  discussed pt's care plan and test results with urology  .   Author: Yetta Blanch, MD  [facility] 09/27/2023  6:56 PM  To reach On-call, Look up on care teams to locate the [facility] team or provider name and reach out to them via secure chat or [email or web address] Between 7PM-7AM, please contact night-coverage. If you still have difficulty reaching the attending provider,  please page the [facility] (Director on Call) for [facility] on amion for assistance.

## 2023-09-27 NOTE — Progress Notes (Signed)
 Patient transferred to [facility] [place] via [place], all personal belongings with patient, attempt made to call spouse Natassia Guthridge VM left.

## 2023-09-28 DIAGNOSIS — I480 Paroxysmal atrial fibrillation: Secondary | ICD-10-CM | POA: Diagnosis not present

## 2023-09-28 DIAGNOSIS — N200 Calculus of kidney: Secondary | ICD-10-CM | POA: Diagnosis not present

## 2023-09-28 LAB — COMPREHENSIVE METABOLIC PANEL WITH GFR
ALT: 40 U/L (ref 0–44)
AST: 25 U/L (ref 15–41)
Albumin: 2.7 g/dL — ABNORMAL LOW (ref 3.5–5.0)
Alkaline Phosphatase: 117 U/L (ref 38–126)
Anion gap: 9 (ref 5–15)
BUN: 50 mg/dL — ABNORMAL HIGH (ref 8–23)
CO2: 24 mmol/L (ref 22–32)
Calcium: 9.3 mg/dL (ref 8.9–10.3)
Chloride: 108 mmol/L (ref 98–111)
Creatinine, Ser: 1.95 mg/dL — ABNORMAL HIGH (ref 0.44–1.00)
GFR, Estimated: 26 mL/min — ABNORMAL LOW (ref >60)
Glucose, Bld: 107 mg/dL — ABNORMAL HIGH (ref 70–99)
Potassium: 4.3 mmol/L (ref 3.5–5.1)
Sodium: 141 mmol/L (ref 135–145)
Total Bilirubin: 0.6 mg/dL (ref 0.0–1.2)
Total Protein: 6.4 g/dL — ABNORMAL LOW (ref 6.5–8.1)

## 2023-09-28 LAB — CBC
HCT: 37.1 % (ref 36.0–46.0)
Hemoglobin: 12.1 g/dL (ref 12.0–15.0)
MCH: 28.8 pg (ref 26.0–34.0)
MCHC: 32.6 g/dL (ref 30.0–36.0)
MCV: 88.3 fL (ref 80.0–100.0)
Platelets: 132 K/uL — ABNORMAL LOW (ref 150–400)
RBC: 4.2 MIL/uL (ref 3.87–5.11)
RDW: 16.6 % — ABNORMAL HIGH (ref 11.5–15.5)
WBC: 33.4 K/uL — ABNORMAL HIGH (ref 4.0–10.5)
nRBC: 0 % (ref 0.0–0.2)

## 2023-09-28 LAB — APTT
aPTT: 51 s — ABNORMAL HIGH (ref 24–36)
aPTT: 53 s — ABNORMAL HIGH (ref 24–36)

## 2023-09-28 LAB — CK: Total CK: 43 U/L (ref 38–234)

## 2023-09-28 LAB — OSMOLALITY: Osmolality: 317 mosm/kg — ABNORMAL HIGH (ref 275–295)

## 2023-09-28 LAB — MAGNESIUM: Magnesium: 1.8 mg/dL (ref 1.7–2.4)

## 2023-09-28 LAB — HEPARIN LEVEL (UNFRACTIONATED): Heparin Unfractionated: >1.1 [IU]/mL — ABNORMAL HIGH (ref 0.30–0.70)

## 2023-09-28 MED ORDER — DILTIAZEM HCL 30 MG PO TABS
60.0000 mg | ORAL_TABLET | Freq: Four times a day (QID) | ORAL | Status: DC
  Administered 2023-09-28 – 2023-09-30 (×8): 60 mg via ORAL
  Filled 2023-09-28 (×8): qty 2

## 2023-09-28 MED ORDER — SODIUM CHLORIDE 0.9 % IV SOLN
1.0000 g | INTRAVENOUS | Status: DC
  Administered 2023-09-28 – 2023-09-29 (×2): 1 g via INTRAVENOUS
  Filled 2023-09-28 (×3): qty 10

## 2023-09-28 MED ORDER — LEVALBUTEROL HCL 0.63 MG/3ML IN NEBU
0.6300 mg | INHALATION_SOLUTION | Freq: Two times a day (BID) | RESPIRATORY_TRACT | Status: DC
  Administered 2023-09-29 – 2023-09-30 (×2): 0.63 mg via RESPIRATORY_TRACT
  Filled 2023-09-28 (×2): qty 3

## 2023-09-28 MED ORDER — APIXABAN 5 MG PO TABS
5.0000 mg | ORAL_TABLET | Freq: Two times a day (BID) | ORAL | Status: DC
  Administered 2023-09-28 – 2023-09-30 (×5): 5 mg via ORAL
  Filled 2023-09-28 (×5): qty 1

## 2023-09-28 NOTE — Progress Notes (Addendum)
 Plan of care is reviewed. Pt has been progressing. She is alert and fully oriented x 4, afebrile, stable hemodynamically.   Her EKG is converted to NSR at 11.14 pm. HR  70s-80s, BP 119/58-122/69 mmHg.Continue Heparin  gtt and Amiodarone  30 mg/hr.  SPO2 88-87% on room air, so we keep her SPO2 94-96% on 2 LPM of NCL.   Her urine output is tea/ dark red color through Foley catheter. Pt has good oral and liquid intake. She denies pain.  Pt is able to rest well with no major complaints. No obvious acute distress noted overnight. We will continue to monitor.   Wendi Dash, RN

## 2023-09-28 NOTE — Progress Notes (Signed)
 EKG from previous NSR and now Is now back to be Atrial fib with HR 100-130. BP stable. Pt is sleeping. We will continue to monitor.   Wendi Dash, RN

## 2023-09-28 NOTE — Progress Notes (Signed)
 PHARMACY - ANTICOAGULATION  Pharmacy Consult for heparin  Indication: atrial fibrillation Brief A/P: aPTT subtherapeutic Increase Heparin  rate  Allergies  Allergen Reactions   Mirabegron Itching and Rash    Patient Measurements: Height: 5' 6 (167.6 cm) Weight: 123.6 kg (272 lb 8 oz) IBW/kg (Calculated) : 59.3 HEPARIN  DW (KG): 89.1  Vital Signs: Temp: 97.8 F (36.6 C) (07/11 2346) Temp Source: Oral (07/11 2346) BP: 119/58 (07/12 0000) Pulse Rate: 75 (07/12 0000)  Labs: Recent Labs    09/26/23 1707 09/26/23 2206 09/27/23 0335 09/27/23 0621 09/27/23 1724 09/28/23 0042  HGB 11.4*  --  12.1  --   --   --   HCT 37.0  --  37.3  --   --   --   PLT 149*  --  154  --   --   --   APTT  --   --   --   --  47* 51*  CREATININE 3.60* 3.62* 3.38* 3.13*  --   --     Estimated Creatinine Clearance: 19.6 mL/min (A) (by C-G formula based on SCr of 3.13 mg/dL (H)).  Assessment: 78 y.o. female with h/o Afib, Eliquis  on hold, for heparin   Goal of Therapy:  Heparin  level 0.3-0.7 units/ml aPTT 66-102 seconds Monitor platelets by anticoagulation protocol: Yes   Plan:  Increase Heparin  1650 units/hr Follow-up am labs.  Cathlyn Arrant, PharmD, BCPS  09/28/23 1:16 AM

## 2023-09-28 NOTE — Progress Notes (Signed)
 [facility] Progress Note Patient: Shannon Hammond FMW:[id number] DOB: [birth year] DOA: 09/25/2023  DOS: the patient was seen and examined on 09/28/2023  Brief Hospital Course: Patient with PMH of DVT and PE, COPD, HTN, GERD, hypothyroidism, CKD 3B, PAF anticoagulation, chronic HFpEF presented to the hospital at [facility] for management of staghorn calculus.  After her ureteroscopy patient remained hypoxic.  Developed A-fib with RVR as well as AKI on CKD and therefore hospitalist service was consulted. Patient was transferred to [facility] for cardiology consultation and further management as well. Assessment and Plan: Acute respiratory failure with hypoxia Developed persistent hypoxia with atrial fibrillation with RVR post ureteroscopy and was placed on 3L oxygen. Concern for volume overload postprocedure.  Treated with IV Lasix .  There is also concern for PE reoccurrence and patient is on IV heparin . Patient was on oral Eliquis  prior to admission which was held for surgery.  Continue Eliquis . VQ scan is ordered negative for any PE.   Staghorn calculus S/p bilateral 1st stage ureteroscopy for large volume stones by urology, Dr. Alvaro on 09/25/2023 Management per urology. Currently has a Foley catheter.  Urine appears dark in color although no gross hematuria.   Paroxysmal A-fib with RVR. History of PAF noted in chart since 2017. She states this was during her large PE  Appreciate cardiology consultation. Currently on IV amiodarone  with soft blood pressure. Monitor for rate control.. Was on Eliquis .  Briefly was given IV heparin .  Now back on Eliquis .   History of pulmonary embolism With hypoxia, VQ scan is ordered. Prior to admission was on on eliquis , now on heparin .  Given that there is no procedure planned in near future, we will switch her back to Eliquis .   Chronic diastolic CHF and RVF Does not appear volume overloaded Echo 2017 with normal EF, grade 1 DD and moderate to severely  reduced RVF  Repeat echo ordered.  Report pending. Holding Lasix  given worsening renal function as well as hypotension.   COPD/asthma No evidence of exacerbation. Continue inhalers.   AKI on CKD 3B. Baseline serum creatinine appears to be around 1.2.  Postprocedure serum creatinine was 3.6.  Improved with IV diuresis to 3.13 therefore there is concern for cardiorenal hemodynamics. Currently has soft blood pressure and appears to be euvolemic and therefore diuresis on hold. I discussed with urology they do not think that the urological procedure would result in AKI. Repeat renal function today appears to be significantly better.  Will monitor.   Essential hypertension Blood pressure low   Hypothyroidism Continue home synthroid  for now    Esophageal reflux Continue PPI   Expected hematuria. Bladder output appears to be with some red tinge. Monitor for now. No clots.   Subjective: Denies any acute complaint.  No nausea no vomiting no fever no chills.  Breathing improving.  Physical Exam: General: in Mild distress, No Rash Cardiovascular: S1 and S2 Present, No Murmur Respiratory: Good respiratory effort, Bilateral Air entry present.  Basal crackles, No wheezes Abdomen: Bowel Sound present, No tenderness Extremities: Trace edema Neuro: Alert and oriented x3, no new focal deficit  Data Reviewed: I have Reviewed nursing notes, Vitals, and Lab results. Since last encounter, pertinent lab results CBC and BMP   . I have ordered test including CBC and BMP  .   Disposition: Status is: Inpatient Remains inpatient appropriate because: Monitor for improvement in renal function  Place TED hose Start: 09/28/23 1448 SCDs Start: 09/25/23 2041 apixaban  (ELIQUIS ) tablet 5 mg   Family  Communication: No one at bedside Level of care: Progressive   Vitals:   09/28/23 0800 09/28/23 1145 09/28/23 1656 09/28/23 1700  BP: 114/74 (!) 118/52 124/72 124/72  Pulse: (!) 105 77 (!) 103 (!)  106  Resp: 18 16 (!) 23 20  Temp: 97.9 F (36.6 C) 98.7 F (37.1 C) 98.2 F (36.8 C)   TempSrc: Oral Oral Oral   SpO2: 96% 92% 94% 95%  Weight:      Height:         Author: Yetta Blanch, MD 09/28/2023 7:15 PM  Please look on [email or web address] to find out who is on call.

## 2023-09-28 NOTE — Progress Notes (Signed)
 Cardiology:  Shannon Hammond  Subjective:  Denies SSCP, palpitations or Dyspnea   Objective:  Vitals:   09/28/23 0400 09/28/23 0410 09/28/23 0700 09/28/23 0800  BP: (!) 124/57 122/65  114/74  Pulse: (!) 109 86 (!) 110 (!) 105  Resp: (!) 23 18 (!) 23 18  Temp:  97.8 F (36.6 C) 97.9 F (36.6 C) 97.9 F (36.6 C)  TempSrc:  Oral  Oral  SpO2: 95% 95% 96% 96%  Weight:      Height:        Intake/Output from previous day:  Intake/Output Summary (Last 24 hours) at 09/28/2023 0819 Last data filed at 09/28/2023 0700 Gross per 24 hour  Intake 1922.72 ml  Output 3150 ml  Net -1227.28 ml    Physical Exam: Obese female Lungs clear Distant heart sounds Foley Chronic bilateral LE lymphedema  Lab Results: Basic Metabolic Panel: Recent Labs    09/26/23 1707 09/26/23 2206 09/27/23 0335 09/27/23 0621  NA 138   < > 140 140  K 5.4*   < > 5.2* 4.7  CL 104   < > 103 103  CO2 21*   < > 24 23  GLUCOSE 123*   < > 144* 132*  BUN 50*   < > 52* 52*  CREATININE 3.60*   < > 3.38* 3.13*  CALCIUM  8.9   < > 9.2 9.2  MG 1.7  --   --  2.0   < > = values in this interval not displayed.   Liver Function Tests: Recent Labs    09/26/23 1707 09/27/23 0335  AST 75* 59*  ALT 69* 63*  ALKPHOS 117 116  BILITOT 1.2 0.9  PROT 6.6 6.6  ALBUMIN  3.1* 3.0*   No results for input(s): LIPASE, AMYLASE in the last 72 hours. CBC: Recent Labs    09/26/23 1707 09/27/23 0335  WBC 37.2* 39.4*  NEUTROABS 32.2* 33.0*  HGB 11.4* 12.1  HCT 37.0 37.3  MCV 95.9 89.9  PLT 149* 154    D-Dimer: Recent Labs    09/26/23 1707  DDIMER >20.00*   Thyroid  Function Tests: Recent Labs    09/26/23 1707  TSH 1.563   Anemia Panel: Recent Labs    09/26/23 1707  VITAMINB12 421    Imaging: NM Pulmonary Perfusion Result Date: 09/27/2023 CLINICAL DATA:  Concern for pulmonary embolism. EXAM: NUCLEAR MEDICINE PERFUSION LUNG SCAN TECHNIQUE: Perfusion images were obtained in multiple projections  after intravenous injection of radiopharmaceutical. RADIOPHARMACEUTICALS:  4.0 mCi Tc-77m MAA COMPARISON:  None Available. FINDINGS: No wedge-shaped peripheral perfusion defect within LEFT or RIGHT lung to suggest acute pulmonary embolism. Normal perfusion pattern. IMPRESSION: No acute pulmonary embolism. Electronically Signed   By: Jackquline Boxer M.D.   On: 09/27/2023 15:18   DG CHEST PORT 1 VIEW Result Date: 09/27/2023 CLINICAL DATA:  141880 SOB (shortness of breath) 141880 EXAM: PORTABLE CHEST - 1 VIEW COMPARISON:  09/26/2023 FINDINGS: Mild elevation of the right hemidiaphragm. Right basilar atelectasis. No focal airspace consolidation, pleural effusion, or pneumothorax. Mild cardiomegaly. Tortuous aorta with aortic atherosclerosis.no acute fracture or destructive lesion. Multilevel thoracic osteophytosis. IMPRESSION: Unchanged cardiomegaly. Otherwise, no acute cardiopulmonary abnormality. Electronically Signed   By: Rogelia Myers M.D.   On: 09/27/2023 12:37   ECHOCARDIOGRAM COMPLETE Result Date: 09/27/2023    ECHOCARDIOGRAM REPORT   Patient Name:   Shannon Hammond [facility] Date of Exam: 09/27/2023 Medical Rec #:  [id number]      Height:       66.0 in Accession #:  [id number]     Weight:       272.5 lb Date of Birth:  [birth year]      BSA:          2.281 m Patient Age:    77 years       BP:           93/67 mmHg Patient Gender: F              HR:           128 bpm. Exam Location:  Inpatient Procedure: 2D Echo, Cardiac Doppler and Color Doppler (Both Spectral and Color            Flow Doppler were utilized during procedure). Indications:     Arrhythmia  History:         Patient has no prior history of Echocardiogram examinations.                  COPD, Arrythmias:Atrial Fibrillation; Risk                  Factors:Hypertension.  Sonographer:     Jayson Gaskins Referring Phys:  [id number] ISAIAH GERALDS Diagnosing Phys: Shannon Hammond Nanas MD IMPRESSIONS  1. Technically difficult study, limited views and patient in Afib  with RVR. Left ventricular ejection fraction, by estimation, is 50-55%. The left ventricle has grossly low normal function. Left ventricular endocardial border not optimally defined to evaluate regional wall motion. There is moderate left ventricular hypertrophy. Left ventricular diastolic parameters are indeterminate. Recommend limited echo with contrast to better evaluate LV function once heart rate improved  2. Poor RV visualization but appears mild systolic dysfunction. Right ventricular systolic function is mildly reduced. The right ventricular size is normal.  3. The mitral valve is degenerative. Trivial mitral valve regurgitation.  4. The aortic valve was not well visualized. Aortic valve regurgitation is not visualized. FINDINGS  Left Ventricle: Left ventricular ejection fraction, by estimation, is 50 to 55%. The left ventricle has low normal function. Left ventricular endocardial border not optimally defined to evaluate regional wall motion. The left ventricular internal cavity  size was normal in size. There is moderate left ventricular hypertrophy. Left ventricular diastolic parameters are indeterminate. Right Ventricle: The right ventricular size is normal. Right vetricular wall thickness was not well visualized. Right ventricular systolic function is mildly reduced. Left Atrium: Left atrial size was not well visualized. Right Atrium: Right atrial size was not well visualized. Pericardium: Trivial pericardial effusion is present. Presence of epicardial fat layer. Mitral Valve: The mitral valve is degenerative in appearance. Trivial mitral valve regurgitation. Tricuspid Valve: The tricuspid valve is normal in structure. Tricuspid valve regurgitation is trivial. Aortic Valve: The aortic valve was not well visualized. Aortic valve regurgitation is not visualized. Pulmonic Valve: The pulmonic valve was not well visualized. Pulmonic valve regurgitation is not visualized. Aorta: The aortic root is normal in  size and structure. IAS/Shunts: The interatrial septum was not well visualized.  LEFT VENTRICLE PLAX 2D LVIDd:         4.60 cm LVIDs:         3.00 cm LV PW:         1.40 cm LV IVS:        1.40 cm LVOT diam:     1.80 cm LVOT Area:     2.54 cm   AORTA Ao Root diam: 2.90 cm MITRAL VALVE               TRICUSPID VALVE  MV Area (PHT): 5.79 cm    TR Peak grad:   36.2 mmHg MV Decel Time: 131 msec    TR Vmax:        301.00 cm/s MV E velocity: 86.50 cm/s                            SHUNTS                            Systemic Diam: 1.80 cm Shannon Hammond Nanas MD Electronically signed by Shannon Hammond Nanas MD Signature Date/Time: 09/27/2023/9:37:20 AM    Final (Updated)    US  RENAL Result Date: 09/26/2023 CLINICAL DATA:  Acute kidney injury EXAM: RENAL / URINARY TRACT ULTRASOUND COMPLETE COMPARISON:  CT 08/08/2023, ultrasound 06/10/2020 FINDINGS: Right Kidney: Renal measurements: 12 x 6.4 x 4.2 cm = volume: 167.5 mL. Slightly echogenic cortex. No hydronephrosis. Cyst in the upper pole measuring 17 mm, no imaging follow-up is recommended Left Kidney: Renal measurements: 11.2 x 7.4 x 4 cm = volume: 171 mL. Slightly echogenic cortex. No hydronephrosis. No mass. Bladder: Appears normal for degree of bladder distention. Other: None. IMPRESSION: No hydronephrosis. Slightly echogenic renal cortices as can be seen with medical renal disease. Electronically Signed   By: Luke Bun M.D.   On: 09/26/2023 23:55   DG CHEST PORT 1 VIEW Result Date: 09/26/2023 CLINICAL DATA:  Acute on chronic respiratory failure with hypoxia EXAM: PORTABLE CHEST 1 VIEW COMPARISON:  Chest radiograph September 25, 2023 FINDINGS: The heart size is mildly enlarged. Increased bronchovascular markings in perihilar region and right paramediastinal with vascular cephalization indicating pulmonary congestion. No consolidation or pulmonary nodule. No pleural effusion or pneumothorax. No acute osseous abnormality. IMPRESSION: Pulmonary congestion without definite  pleural effusion. Mild cardiomegaly. Electronically Signed   By: Megan  Zare M.D.   On: 09/26/2023 17:22    Cardiac Studies:  ECG: afib nonspecific ST changes    Telemetry: afib rate 100-115   Echo:   Medications:    Chlorhexidine  Gluconate Cloth  6 each Topical Daily   fluticasone  furoate-vilanterol  1 puff Inhalation Daily   gabapentin   300 mg Oral QHS   levalbuterol   0.63 mg Nebulization TID   levothyroxine   88 mcg Oral Q0600   pantoprazole   40 mg Oral Daily   senna-docusate  1 tablet Oral BID      amiodarone  30 mg/hr (09/27/23 2354)   heparin  1,650 Units/hr (09/28/23 9347)    Assessment/Plan:   Shannon Hammond is a 78 y.o. 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 Waddell.   PAF: in setting of cystoscopy complicated by hypotension ? Sepsis. Improved now Chronically on eliquis  now heparin  post procedure with foley in Still needs more definitive Rx of staghorn calculi. Continue iv amiodarone  add back PO cardizem  for rate control as BP improved. Transition back to eliquis  when urology/primary service indicate no further procedures planned.  Lymphedema:  resume home lasix  tomorrow  Thyroid :  continue synthroid  replacement   Shannon Hammond 09/28/2023, 8:19 AM

## 2023-09-28 NOTE — Progress Notes (Signed)
 PHARMACY - ANTICOAGULATION CONSULT NOTE  Pharmacy Consult for IV heparin  Indication: atrial fibrillation  Allergies  Allergen Reactions   Mirabegron Itching and Rash    Patient Measurements: Height: 5' 6 (167.6 cm) Weight: 123.6 kg (272 lb 8 oz) IBW/kg (Calculated) : 59.3 HEPARIN  DW (KG): 89.1  Vital Signs: Temp: 97.9 F (36.6 C) (07/12 0800) Temp Source: Oral (07/12 0800) BP: 114/74 (07/12 0800) Pulse Rate: 105 (07/12 0800)  Labs: Recent Labs    09/26/23 1707 09/26/23 2206 09/27/23 0335 09/27/23 0621 09/27/23 1724 09/28/23 0042 09/28/23 0922  HGB 11.4*  --  12.1  --   --   --  12.1  HCT 37.0  --  37.3  --   --   --  37.1  PLT 149*  --  154  --   --   --  132*  APTT  --   --   --   --  47* 51* 53*  HEPARINUNFRC  --   --   --   --   --   --  >1.10*  CREATININE 3.60*   < > 3.38* 3.13*  --   --  1.95*  CKTOTAL  --   --   --   --   --   --  43   < > = values in this interval not displayed.    Estimated Creatinine Clearance: 31.4 mL/min (A) (by C-G formula based on SCr of 1.95 mg/dL (H)).   Medical History: Past Medical History:  Diagnosis Date   Arthritis    BACK AND KNEES   Asthma    COPD (chronic obstructive pulmonary disease) (HCC)    DVT (deep venous thrombosis) (HCC)    GERD (gastroesophageal reflux disease)    Heart murmur    Hemorrhoids    History of kidney stones    History of nocturia    Hypertension    Hypothyroidism    PAF (paroxysmal atrial fibrillation) (HCC)    Recurrent pulmonary emboli (HCC)    Seizures (HCC) 03/19/2005   ONLY ONCE - THOUGHT TO BE STRESS INDUCED - PT WAS DEALING WITH HER MOTHER'S DEATH   Shortness of breath    PT TOLD BY HER MEDICAL DOCTOR - SOME LUNG CHANGES DUE TO 2ND HAND SMOKE - SHE WAS GIVEN INHALER TO USE AS NEEDED.    Medications:  Scheduled:   Chlorhexidine  Gluconate Cloth  6 each Topical Daily   diltiazem   60 mg Oral Q6H   fluticasone  furoate-vilanterol  1 puff Inhalation Daily   gabapentin   300 mg Oral  QHS   levalbuterol   0.63 mg Nebulization TID   levothyroxine   88 mcg Oral Q0600   pantoprazole   40 mg Oral Daily   senna-docusate  1 tablet Oral BID   Infusions:   amiodarone  30 mg/hr (09/27/23 2354)   heparin  1,650 Units/hr (09/28/23 9347)    Assessment: Pt is a 78 yo female with atrial fibrillation with apixaban  PTA. Pharmacy is consulted for heparin  dosing while apixaban  is on hold in setting of cystoscopy/uteroscopy.   aPTT subtherapeutic at 53 secs on 1650 units/hr. Heparin  level remains elevated (>1.10) as expected with apixaban .   Goal of Therapy:  Heparin  level 0.3-0.7 units/ml aPTT 66-102 seconds Monitor platelets by anticoagulation protocol: Yes   Plan:  Increase heparin  to 1950 units/hour Monitor aPTT 8 hours after increase in dose (7/12 1930) Monitor daily CBC and aPTT Monitor anti-Xa 7/13 and assess correlation with aPTT Continue to monitor H&H and platelets  Izetta Carl, PharmD  PGY1 Pharmacy Resident [facility]  09/28/2023 11:06 AM

## 2023-09-29 DIAGNOSIS — N200 Calculus of kidney: Secondary | ICD-10-CM | POA: Diagnosis not present

## 2023-09-29 DIAGNOSIS — I480 Paroxysmal atrial fibrillation: Secondary | ICD-10-CM | POA: Diagnosis not present

## 2023-09-29 LAB — CBC
HCT: 35.2 % — ABNORMAL LOW (ref 36.0–46.0)
Hemoglobin: 11.4 g/dL — ABNORMAL LOW (ref 12.0–15.0)
MCH: 28.7 pg (ref 26.0–34.0)
MCHC: 32.4 g/dL (ref 30.0–36.0)
MCV: 88.7 fL (ref 80.0–100.0)
Platelets: 150 K/uL (ref 150–400)
RBC: 3.97 MIL/uL (ref 3.87–5.11)
RDW: 16.4 % — ABNORMAL HIGH (ref 11.5–15.5)
WBC: 22.2 K/uL — ABNORMAL HIGH (ref 4.0–10.5)
nRBC: 0 % (ref 0.0–0.2)

## 2023-09-29 LAB — GLUCOSE, CAPILLARY: Glucose-Capillary: 72 mg/dL (ref 70–99)

## 2023-09-29 LAB — COMPREHENSIVE METABOLIC PANEL WITH GFR
ALT: 29 U/L (ref 0–44)
AST: 19 U/L (ref 15–41)
Albumin: 2.4 g/dL — ABNORMAL LOW (ref 3.5–5.0)
Alkaline Phosphatase: 125 U/L (ref 38–126)
Anion gap: 7 (ref 5–15)
BUN: 47 mg/dL — ABNORMAL HIGH (ref 8–23)
CO2: 24 mmol/L (ref 22–32)
Calcium: 9.6 mg/dL (ref 8.9–10.3)
Chloride: 108 mmol/L (ref 98–111)
Creatinine, Ser: 1.74 mg/dL — ABNORMAL HIGH (ref 0.44–1.00)
GFR, Estimated: 29 mL/min — ABNORMAL LOW (ref >60)
Glucose, Bld: 84 mg/dL (ref 70–99)
Potassium: 4.9 mmol/L (ref 3.5–5.1)
Sodium: 139 mmol/L (ref 135–145)
Total Bilirubin: 0.5 mg/dL (ref 0.0–1.2)
Total Protein: 5.7 g/dL — ABNORMAL LOW (ref 6.5–8.1)

## 2023-09-29 LAB — MAGNESIUM: Magnesium: 1.7 mg/dL (ref 1.7–2.4)

## 2023-09-29 MED ORDER — ENSURE PLUS HIGH PROTEIN PO LIQD
237.0000 mL | Freq: Two times a day (BID) | ORAL | Status: DC
  Administered 2023-09-29: 237 mL via ORAL

## 2023-09-29 NOTE — Progress Notes (Signed)
 Cardiology:  Shannon Hammond  Subjective:  Denies SSCP, palpitations or Dyspnea Being bathed by tech   Objective:  Vitals:   09/28/23 2043 09/29/23 0004 09/29/23 0405 09/29/23 0739  BP: 134/72 127/70 119/66 (!) 121/94  Pulse: 71 69 67 65  Resp: 18 18 18  (!) 22  Temp: 98.1 F (36.7 C) 97.8 F (36.6 C) 97.6 F (36.4 C) (!) 97.5 F (36.4 C)  TempSrc: Oral Oral Oral Oral  SpO2: 92% 94% 92% 91%  Weight:      Height:        Intake/Output from previous day:  Intake/Output Summary (Last 24 hours) at 09/29/2023 0838 Last data filed at 09/29/2023 0742 Gross per 24 hour  Intake 720 ml  Output 2960 ml  Net -2240 ml    Physical Exam: Obese female Lungs clear Distant heart sounds Foley Chronic bilateral LE lymphedema  Lab Results: Basic Metabolic Panel: Recent Labs    09/28/23 0922 09/29/23 0413  NA 141 139  K 4.3 4.9  CL 108 108  CO2 24 24  GLUCOSE 107* 84  BUN 50* 47*  CREATININE 1.95* 1.74*  CALCIUM  9.3 9.6  MG 1.8 1.7   Liver Function Tests: Recent Labs    09/28/23 0922 09/29/23 0413  AST 25 19  ALT 40 29  ALKPHOS 117 125  BILITOT 0.6 0.5  PROT 6.4* 5.7*  ALBUMIN  2.7* 2.4*   No results for input(s): LIPASE, AMYLASE in the last 72 hours. CBC: Recent Labs    09/26/23 1707 09/27/23 0335 09/28/23 0922 09/29/23 0413  WBC 37.2* 39.4* 33.4* 22.2*  NEUTROABS 32.2* 33.0*  --   --   HGB 11.4* 12.1 12.1 11.4*  HCT 37.0 37.3 37.1 35.2*  MCV 95.9 89.9 88.3 88.7  PLT 149* 154 132* 150    D-Dimer: Recent Labs    09/26/23 1707  DDIMER >20.00*   Thyroid  Function Tests: Recent Labs    09/26/23 1707  TSH 1.563   Anemia Panel: Recent Labs    09/26/23 1707  VITAMINB12 421    Imaging: NM Pulmonary Perfusion Result Date: 09/27/2023 CLINICAL DATA:  Concern for pulmonary embolism. EXAM: NUCLEAR MEDICINE PERFUSION LUNG SCAN TECHNIQUE: Perfusion images were obtained in multiple projections after intravenous injection of radiopharmaceutical.  RADIOPHARMACEUTICALS:  4.0 mCi Tc-43m MAA COMPARISON:  None Available. FINDINGS: No wedge-shaped peripheral perfusion defect within LEFT or RIGHT lung to suggest acute pulmonary embolism. Normal perfusion pattern. IMPRESSION: No acute pulmonary embolism. Electronically Signed   By: Jackquline Boxer M.D.   On: 09/27/2023 15:18   DG CHEST PORT 1 VIEW Result Date: 09/27/2023 CLINICAL DATA:  141880 SOB (shortness of breath) 141880 EXAM: PORTABLE CHEST - 1 VIEW COMPARISON:  09/26/2023 FINDINGS: Mild elevation of the right hemidiaphragm. Right basilar atelectasis. No focal airspace consolidation, pleural effusion, or pneumothorax. Mild cardiomegaly. Tortuous aorta with aortic atherosclerosis.no acute fracture or destructive lesion. Multilevel thoracic osteophytosis. IMPRESSION: Unchanged cardiomegaly. Otherwise, no acute cardiopulmonary abnormality. Electronically Signed   By: Rogelia Myers M.D.   On: 09/27/2023 12:37    Cardiac Studies:  ECG: afib nonspecific ST changes    Telemetry: NSR rates 70's   Echo:   Medications:    apixaban   5 mg Oral BID   Chlorhexidine  Gluconate Cloth  6 each Topical Daily   diltiazem   60 mg Oral Q6H   fluticasone  furoate-vilanterol  1 puff Inhalation Daily   gabapentin   300 mg Oral QHS   levalbuterol   0.63 mg Nebulization BID   levothyroxine   88 mcg Oral Q0600  pantoprazole   40 mg Oral Daily   senna-docusate  1 tablet Oral BID      amiodarone  30 mg/hr (09/28/23 2318)   cefTRIAXone  (ROCEPHIN )  IV 1 g (09/28/23 1522)    Assessment/Plan:   Shannon Hammond is a 78 y.o. 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 Waddell.   PAF: in setting of cystoscopy complicated by hypotension ? Sepsis. Improved now Chronically on eliquis  now heparin  post procedure with foley in Still needs more definitive Rx of staghorn calculi. Continue iv amiodarone  add back PO cardizem  for rate control as BP improved. Transition back to eliquis  when urology/primary service indicate no further procedures planned. She is in NSR this am.  Lymphedema:  resume home lasix  at some point She had 2.2 L spontaneous diuresis yesterday  Thyroid :  continue synthroid  replacement   Shannon Hammond 09/29/2023, 8:38 AM

## 2023-09-29 NOTE — Evaluation (Signed)
 Occupational Therapy Evaluation Patient Details Name: Shannon Hammond MRN: [id number] DOB: 05-25-1945 Today's Date: 09/29/2023   History of Present Illness   Pt is a 78 y.o. female admitted 7/9 for scheduled    Cystoscopy and kidney stones. Noted to be hypoxic post-op. CHF exacerbation and AKI.  PMH: HTN, aortic atherosclerosis, PAF, HFpEF, pulmonary HTN, asthma, hx of DVT/PE (2012, 2017), GERD, hypothyroid, CKD, arthritis     Clinical Impressions Pt admitted based on above, and was seen based on problem list below. PTA pt was independent with ADLs and IADLs. Today pt is requiring set up  to min assist for ADLs. Bed mobility and functional transfers are  CGA. Noted pt requiring increased time and effort to complete tasks. Presenting with decreased activity tolerance, and balance. Pt reporting near functional baseline, and anticipate pt will progress well. Pt with adequate support available upon d/c no follow up OT or DME needs. OT will continue to follow acutely to maximize functional independence.        If plan is discharge home, recommend the following:   A little help with walking and/or transfers;A little help with bathing/dressing/bathroom;Assistance with cooking/housework     Functional Status Assessment   Patient has had a recent decline in their functional status and demonstrates the ability to make significant improvements in function in a reasonable and predictable amount of time.     Equipment Recommendations   None recommended by OT      Precautions/Restrictions   Precautions Precautions: Fall Recall of Precautions/Restrictions: Intact Restrictions Weight Bearing Restrictions Per Provider Order: No     Mobility Bed Mobility Overal bed mobility: Needs Assistance Bed Mobility: Supine to Sit     Supine to sit: Contact guard, HOB elevated     General bed mobility comments: With increased time and effort    Transfers Overall transfer level: Needs  assistance Equipment used: None Transfers: Sit to/from Stand, Bed to chair/wheelchair/BSC Sit to Stand: Contact guard assist     Step pivot transfers: Contact guard assist     General transfer comment: No AD, pt mildly unsteady d/t foot pain, furniture walking for room distances      Balance Overall balance assessment: Mild deficits observed, not formally tested         ADL either performed or assessed with clinical judgement   ADL Overall ADL's : Needs assistance/impaired Eating/Feeding: Set up;Sitting   Grooming: Supervision/safety;Standing;Oral care           Upper Body Dressing : Set up;Sitting   Lower Body Dressing: Minimal assistance;Sit to/from stand Lower Body Dressing Details (indicate cue type and reason): Min assist fort LLE sock Toilet Transfer: Contact guard assist;Ambulation Toilet Transfer Details (indicate cue type and reason): CGA for balance, No AD, furniture walking Toileting- Clothing Manipulation and Hygiene: Supervision/safety;Sit to/from stand       Functional mobility during ADLs: Contact guard assist;Rolling walker (2 wheels) General ADL Comments: Decreased activity tolerance     Vision Baseline Vision/History: 0 No visual deficits Patient Visual Report: No change from baseline Vision Assessment?: No apparent visual deficits            Pertinent Vitals/Pain Pain Assessment Pain Assessment: Faces Faces Pain Scale: Hurts a little bit Pain Location: bil feet Pain Descriptors / Indicators: Discomfort Pain Intervention(s): Repositioned     Extremity/Trunk Assessment Upper Extremity Assessment Upper Extremity Assessment: Overall WFL for tasks assessed   Lower Extremity Assessment Lower Extremity Assessment: Defer to PT evaluation       Communication  Communication Communication: No apparent difficulties   Cognition Arousal: Alert Behavior During Therapy: WFL for tasks assessed/performed Cognition: No apparent impairments        Following commands: Intact       Cueing  General Comments   Cueing Techniques: Verbal cues  pt c/o of SOB, increased respirations up to 27, O2 sats stable on RA           Home Living Family/patient expects to be discharged to:: Private residence Living Arrangements: Spouse/significant other;Children Available Help at Discharge: Family;Available 24 hours/day Type of Home: House Home Access: Stairs to enter [employer] of Steps: 1   Home Layout: One level (1 step from den to living room)     Bathroom Shower/Tub: Chief Strategy Officer: Handicapped height Bathroom Accessibility: Yes How Accessible: Accessible via walker Home Equipment: Crutches;Cane - single point;BSC/3in1;Rollator (4 wheels);Shower seat (rollator used could beneft from a new one)          Prior Functioning/Environment Prior Level of Function : Independent/Modified Independent;Driving             Mobility Comments: No AD ADLs Comments: Completes own IADLs, drives, grocery shops, meds    OT Problem List: Decreased activity tolerance;Impaired balance (sitting and/or standing);Cardiopulmonary status limiting activity   OT Treatment/Interventions: Self-care/ADL training;Therapeutic exercise;Energy conservation;DME and/or AE instruction;Therapeutic activities;Patient/family education;Balance training      OT Goals(Current goals can be found in the care plan section)   Acute Rehab OT Goals Patient Stated Goal: To get better OT Goal Formulation: With patient Time For Goal Achievement: 10/13/23 Potential to Achieve Goals: Good   OT Frequency:  Min 2X/week       AM-PAC OT 6 Clicks Daily Activity     Outcome Measure Help from another person eating meals?: None Help from another person taking care of personal grooming?: A Little Help from another person toileting, which includes using toliet, bedpan, or urinal?: A Little Help from another person bathing (including  washing, rinsing, drying)?: A Little Help from another person to put on and taking off regular upper body clothing?: A Little Help from another person to put on and taking off regular lower body clothing?: A Little 6 Click Score: 19   End of Session Equipment Utilized During Treatment: Gait belt Nurse Communication: Mobility status  Activity Tolerance: Patient tolerated treatment well Patient left: in chair;with call bell/phone within reach  OT Visit Diagnosis: Unsteadiness on feet (R26.81);Muscle weakness (generalized) (M62.81)                Time: 9246-9176 OT Time Calculation (min): 30 min Charges:  OT General Charges $OT Visit: 1 Visit OT Evaluation $OT Eval Moderate Complexity: 1 Mod OT Treatments $Self Care/Home Management : 8-22 mins  Adrianne BROCKS, OT  Acute Rehabilitation Services Office [phone] Secure chat preferred   Adrianne GORMAN Savers 09/29/2023, 8:41 AM

## 2023-09-29 NOTE — Progress Notes (Signed)
 [facility] Progress Note Patient: Shannon Hammond FMW:[id number] DOB: [birth year] DOA: 09/25/2023  DOS: the patient was seen and examined on 09/29/2023  Brief Hospital Course: Patient with PMH of DVT and PE, COPD, HTN, GERD, hypothyroidism, CKD 3B, PAF anticoagulation, chronic HFpEF presented to the hospital at [facility] for management of staghorn calculus.  After her ureteroscopy patient remained hypoxic.  Developed A-fib with RVR as well as AKI on CKD and therefore hospitalist service was consulted. Patient was transferred to [facility] for cardiology consultation and further management as well. Assessment and Plan: Acute respiratory failure with hypoxia Developed persistent hypoxia with atrial fibrillation with RVR post ureteroscopy and was placed on 3L oxygen. Concern for volume overload postprocedure.  Treated with IV Lasix .  There is also concern for PE reoccurrence and patient is on IV heparin . Patient was on oral Eliquis  prior to admission which was held for surgery.  Continue Eliquis . VQ scan is ordered negative for any PE.   Staghorn calculus S/p bilateral 1st stage ureteroscopy for large volume stones by urology, Dr. Alvaro on 09/25/2023 Management per urology. Currently has a Foley catheter.  Urine appears dark in color although no gross hematuria.   Paroxysmal A-fib with RVR. History of PAF noted in chart since 2017. She states this was during her large PE  Appreciate cardiology consultation. Currently on IV amiodarone  with soft blood pressure. Monitor for rate control.. Was on Eliquis .  Briefly was given IV heparin .  Now back on Eliquis .   History of pulmonary embolism With hypoxia, VQ scan is ordered. Prior to admission was on on eliquis , now on heparin .  Given that there is no procedure planned in near future, we will switch her back to Eliquis .   Chronic diastolic CHF and RVF Does not appear volume overloaded Echo 2017 with normal EF, grade 1 DD and moderate to severely  reduced RVF  Repeat echo ordered.  Report pending. Holding Lasix  given worsening renal function as well as hypotension.   COPD/asthma No evidence of exacerbation. Continue inhalers.   AKI on CKD 3B. Baseline serum creatinine appears to be around 1.2.  Postprocedure serum creatinine was 3.6.  Improved with IV diuresis to 3.13 therefore there is concern for cardiorenal hemodynamics. Currently has soft blood pressure and appears to be euvolemic and therefore diuresis on hold. I discussed with urology they do not think that the urological procedure would result in AKI. Renal function spontaneously improving.  Volume status spontaneously improving.   Essential hypertension Blood pressure low   Hypothyroidism Continue home synthroid  for now    Esophageal reflux Continue PPI   Expected hematuria. Bladder output appears to be with some red tinge. Monitor for now. No clots.   Subjective: No nausea no vomiting no fever no chills.  No chest pain.   Physical Exam: General: in Mild distress, No Rash Cardiovascular: S1 and S2 Present, No Murmur Respiratory: Good respiratory effort, Bilateral Air entry present. No Crackles, No wheezes Abdomen: Bowel Sound present, No tenderness Extremities: Chronic edema Neuro: Alert and oriented x3, no new focal deficit  Data Reviewed: I have Reviewed nursing notes, Vitals, and Lab results. Since last encounter, pertinent lab results CBC and BMP   . I have ordered test including CBC and BMP  .   Disposition: Status is: Inpatient Remains inpatient appropriate because: Monitor for improvement in renal function and respiratory status.  Need to be off of the IV amiodarone .  Place TED hose Start: 09/28/23 1448 SCDs Start: 09/25/23 2041 apixaban  (ELIQUIS ) tablet  5 mg   Family Communication: No one at bedside Level of care: Progressive   Vitals:   09/29/23 0739 09/29/23 0925 09/29/23 1252 09/29/23 1500  BP: (!) 121/94  137/70 (!) 152/87  Pulse:  65  72   Resp: (!) 22  18   Temp: (!) 97.5 F (36.4 C)  98.4 F (36.9 C) 98.4 F (36.9 C)  TempSrc: Oral  Oral   SpO2: 91% 96% 94%   Weight:      Height:         Author: Yetta Blanch, MD 09/29/2023 8:08 PM  Please look on [email or web address] to find out who is on call.

## 2023-09-29 NOTE — Evaluation (Signed)
 Physical Therapy Evaluation Patient Details Name: Shannon Hammond MRN: [id number] DOB: 05/19/1945 Today's Date: 09/29/2023  History of Present Illness  Pt is a 78 y.o. female admitted 7/9 for scheduled Cystoscopy and kidney stones. Noted to be hypoxic post-op. CHF exacerbation and AKI.  PMH: HTN, aortic atherosclerosis, PAF, HFpEF, pulmonary HTN, asthma, hx of DVT/PE (2012, 2017), GERD, hypothyroid, CKD, arthritis  Clinical Impression  Pt admitted with above diagnosis. Ambulates with CGA, moderately antalgic gait pattern, complaining of Lt>Rt knee pain (typically wears a brace which helps.) Reviewed recommendation for AD use which should help with gait efficiency and comfort. SpO2 92% and greater on room air during evaluation including while ambulating. Moderate dyspnea and fatigues easily. Good family support at home. Would benefit from OPPT visit, pt hesitant to consider. Pt currently with functional limitations due to the deficits listed below (see PT Problem List). Pt will benefit from acute skilled PT to increase their independence and safety with mobility to allow discharge.           If plan is discharge home, recommend the following: A little help with walking and/or transfers;A little help with bathing/dressing/bathroom;Assistance with cooking/housework;Help with stairs or ramp for entrance;Assist for transportation   Can travel by private vehicle        Equipment Recommendations None recommended by PT  Recommendations for Other Services       Functional Status Assessment Patient has had a recent decline in their functional status and demonstrates the ability to make significant improvements in function in a reasonable and predictable amount of time.     Precautions / Restrictions Precautions Precautions: Fall Recall of Precautions/Restrictions: Intact Restrictions Weight Bearing Restrictions Per Provider Order: No      Mobility  Bed Mobility               General  bed mobility comments: in reclinr    Transfers Overall transfer level: Needs assistance Equipment used: None Transfers: Sit to/from Stand Sit to Stand: Contact guard assist           General transfer comment: CGA for safety to rise from chair, reaches for rail on bed but self stabilizes without therapist's assistance.    Ambulation/Gait Ambulation/Gait assistance: Contact guard assist Gait Distance (Feet): 60 Feet Assistive device: None Gait Pattern/deviations: Step-through pattern, Decreased stance time - left, Decreased stride length, Decreased weight shift to left, Antalgic, Wide base of support Gait velocity: dec Gait velocity interpretation: <1.31 ft/sec, indicative of household ambulator   General Gait Details: Moderately antalgic, reports Lt knee pain > Rt with weight bearing. Declines to use AD but educated on efficency and stability benefits. SpO2 Maintained 92% and greater on room air while ambulating. Cues for safety and symptom awareness. Moderate dyspnea, fatigues easily.  Stairs            Wheelchair Mobility     Tilt Bed    Modified Rankin (Stroke Patients Only)       Balance Overall balance assessment: Mild deficits observed, not formally tested                                           Pertinent Vitals/Pain Pain Assessment Pain Assessment: Faces Faces Pain Scale: Hurts a little bit Pain Location: bil feet Pain Descriptors / Indicators: Discomfort Pain Intervention(s): Monitored during session, Repositioned    Home Living Family/patient expects to be discharged to:: Private  residence Living Arrangements: Spouse/significant other;Children Available Help at Discharge: Family;Available 24 hours/day Type of Home: House Home Access: Stairs to enter   [employer] of Steps: 1 (threshold)   Home Layout: One level (1 step from den to living room) Home Equipment: Crutches;Cane - single point;BSC/3in1;Rollator (4  wheels);Shower seat (States she has several walkers)      Prior Function Prior Level of Function : Independent/Modified Independent;Driving             Mobility Comments: Rare use of walker in home ADLs Comments: Completes own IADLs, drives, grocery shops, meds     Extremity/Trunk Assessment   Upper Extremity Assessment Upper Extremity Assessment: Overall WFL for tasks assessed    Lower Extremity Assessment Lower Extremity Assessment: Generalized weakness (Edematous, hx of lymphedema)       Communication   Communication Communication: No apparent difficulties    Cognition Arousal: Alert Behavior During Therapy: WFL for tasks assessed/performed   PT - Cognitive impairments: No apparent impairments                         Following commands: Intact       Cueing Cueing Techniques: Verbal cues     General Comments General comments (skin integrity, edema, etc.): pt c/o of SOB, increased respirations up to 27, O2 sats stable on RA    Exercises General Exercises - Lower Extremity Ankle Circles/Pumps: AROM, Both, 10 reps, Seated Long Arc Quad: Strengthening, Both, 10 reps, Seated   Assessment/Plan    PT Assessment Patient needs continued PT services  PT Problem List Decreased strength;Decreased range of motion;Decreased activity tolerance;Decreased balance;Decreased mobility;Cardiopulmonary status limiting activity;Pain;Obesity       PT Treatment Interventions DME instruction;Gait training;Stair training;Functional mobility training;Therapeutic activities;Therapeutic exercise;Balance training;Neuromuscular re-education;Patient/family education;Modalities;Manual techniques    PT Goals (Current goals can be found in the Care Plan section)  Acute Rehab PT Goals Patient Stated Goal: Go home PT Goal Formulation: With patient Time For Goal Achievement: 10/13/23 Potential to Achieve Goals: Good    Frequency Min 2X/week     Co-evaluation                AM-PAC PT 6 Clicks Mobility  Outcome Measure Help needed turning from your back to your side while in a flat bed without using bedrails?: None Help needed moving from lying on your back to sitting on the side of a flat bed without using bedrails?: A Little Help needed moving to and from a bed to a chair (including a wheelchair)?: A Little Help needed standing up from a chair using your arms (e.g., wheelchair or bedside chair)?: A Little Help needed to walk in hospital room?: A Little Help needed climbing 3-5 steps with a railing? : A Little 6 Click Score: 19    End of Session   Activity Tolerance: Patient limited by fatigue Patient left: in chair;with call bell/phone within reach;with chair alarm set (RT in room)   PT Visit Diagnosis: Unsteadiness on feet (R26.81);Other abnormalities of gait and mobility (R26.89);Muscle weakness (generalized) (M62.81);Difficulty in walking, not elsewhere classified (R26.2);Pain Pain - part of body:  (BIL feet and Lt knee > Rt knee)    Time: 9088-9074 PT Time Calculation (min) (ACUTE ONLY): 14 min   Charges:   PT Evaluation $PT Eval Low Complexity: 1 Low   PT General Charges $$ ACUTE PT VISIT: 1 Visit         Leontine Roads, PT, DPT [facility]  Rehabilitation Services Physical  Therapist Office: [phone] Website: [email or web address]   Leontine GORMAN Roads 09/29/2023, 10:29 AM

## 2023-09-30 DIAGNOSIS — N200 Calculus of kidney: Secondary | ICD-10-CM | POA: Diagnosis not present

## 2023-09-30 LAB — CBC WITH DIFFERENTIAL/PLATELET
Abs Immature Granulocytes: 0.59 K/uL — ABNORMAL HIGH (ref 0.00–0.07)
Basophils Absolute: 0.1 K/uL (ref 0.0–0.1)
Basophils Relative: 1 %
Eosinophils Absolute: 0.3 K/uL (ref 0.0–0.5)
Eosinophils Relative: 3 %
HCT: 39.1 % (ref 36.0–46.0)
Hemoglobin: 13 g/dL (ref 12.0–15.0)
Immature Granulocytes: 5 %
Lymphocytes Relative: 20 %
Lymphs Abs: 2.6 K/uL (ref 0.7–4.0)
MCH: 29 pg (ref 26.0–34.0)
MCHC: 33.2 g/dL (ref 30.0–36.0)
MCV: 87.1 fL (ref 80.0–100.0)
Monocytes Absolute: 1.4 K/uL — ABNORMAL HIGH (ref 0.1–1.0)
Monocytes Relative: 11 %
Neutro Abs: 8.2 K/uL — ABNORMAL HIGH (ref 1.7–7.7)
Neutrophils Relative %: 60 %
Platelets: 152 K/uL (ref 150–400)
RBC: 4.49 MIL/uL (ref 3.87–5.11)
RDW: 16.3 % — ABNORMAL HIGH (ref 11.5–15.5)
WBC: 13.3 K/uL — ABNORMAL HIGH (ref 4.0–10.5)
nRBC: 0 % (ref 0.0–0.2)

## 2023-09-30 LAB — BASIC METABOLIC PANEL WITH GFR
Anion gap: 10 (ref 5–15)
BUN: 45 mg/dL — ABNORMAL HIGH (ref 8–23)
CO2: 22 mmol/L (ref 22–32)
Calcium: 10.1 mg/dL (ref 8.9–10.3)
Chloride: 105 mmol/L (ref 98–111)
Creatinine, Ser: 1.68 mg/dL — ABNORMAL HIGH (ref 0.44–1.00)
GFR, Estimated: 31 mL/min — ABNORMAL LOW (ref >60)
Glucose, Bld: 94 mg/dL (ref 70–99)
Potassium: 4.6 mmol/L (ref 3.5–5.1)
Sodium: 137 mmol/L (ref 135–145)

## 2023-09-30 LAB — MAGNESIUM: Magnesium: 1.7 mg/dL (ref 1.7–2.4)

## 2023-09-30 MED ORDER — ROSUVASTATIN CALCIUM 5 MG PO TABS
5.0000 mg | ORAL_TABLET | ORAL | Status: DC
  Administered 2023-09-30: 5 mg via ORAL
  Filled 2023-09-30: qty 1

## 2023-09-30 MED ORDER — AMIODARONE HCL 200 MG PO TABS: 200.0000 mg | ORAL_TABLET | Freq: Every day | ORAL | 0 refills | Status: DC

## 2023-09-30 MED ORDER — CEFADROXIL 500 MG PO CAPS: 500.0000 mg | ORAL_CAPSULE | Freq: Two times a day (BID) | ORAL | Status: DC

## 2023-09-30 MED ORDER — CEFADROXIL 500 MG PO CAPS
500.0000 mg | ORAL_CAPSULE | Freq: Every day | ORAL | Status: DC
  Filled 2023-09-30: qty 1

## 2023-09-30 MED ORDER — DILTIAZEM HCL ER 60 MG PO CP12
120.0000 mg | ORAL_CAPSULE | Freq: Two times a day (BID) | ORAL | Status: DC
  Administered 2023-09-30: 120 mg via ORAL
  Filled 2023-09-30 (×3): qty 2

## 2023-09-30 MED ORDER — AMIODARONE HCL 200 MG PO TABS
200.0000 mg | ORAL_TABLET | Freq: Every day | ORAL | Status: DC
Start: 2023-09-30 — End: 2023-09-30
  Administered 2023-09-30: 200 mg via ORAL
  Filled 2023-09-30: qty 1

## 2023-09-30 MED ORDER — MAGNESIUM SULFATE 2 GM/50ML IV SOLN
2.0000 g | Freq: Once | INTRAVENOUS | Status: AC
  Administered 2023-09-30: 2 g via INTRAVENOUS
  Filled 2023-09-30: qty 50

## 2023-09-30 MED ORDER — CEFADROXIL 500 MG PO CAPS: 500.0000 mg | ORAL_CAPSULE | Freq: Every day | ORAL | 0 refills | Status: AC

## 2023-09-30 MED ORDER — ENSURE PLUS HIGH PROTEIN PO LIQD: 237.0000 mL | Freq: Two times a day (BID) | ORAL | 0 refills | Status: DC

## 2023-09-30 NOTE — TOC Initial Note (Signed)
 Transition of Care (TOC) - Initial/Assessment Note  Rayfield Gobble RN, BSN Inpatient Care Management Unit 4E- RN Case Manager See Treatment Team for direct phone #   Patient Details  Name: Shannon Hammond MRN: [id number] Date of Birth: 08-12-1945  Transition of Care [facility]) CM/SW Contact:    Gobble Rayfield Hurst, RN Phone Number: 09/30/2023, 11:59 AM  Clinical Narrative:                 Per MD pt for possible d/c later today.  Note recommendations for outpt PT follow up. MD has given verbal order for outpt PT referral.   CM spoke with pt at bedside to discuss outpt therapy. Per pt she does not want to do outpt therapy at this time. Voiced that she has exercise bike at home and will do her own strengthen at home. Outpt PT referral declined by pt on discharge.  Explained to pt that should she change her mind after return home- to contact her PCP for outpt therapy referral. Pt voiced understanding.   Pt voiced she has walkers at home, and declines any DME needs.  Lives at home with spouse, daughter and grand-daughter.  Pt voiced her spouse will transport home.    No further CM needs noted.   Expected Discharge Plan: OP Rehab Barriers to Discharge: Barriers Resolved   Patient Goals and CMS Choice Patient states their goals for this hospitalization and ongoing recovery are:: return home with family   Choice offered to / list presented to : NA      Expected Discharge Plan and Services In-house Referral: NA Discharge Planning Services: CM Consult Post Acute Care Choice: NA Living arrangements for the past 2 months: Single Family Home Expected Discharge Date: 09/25/23               DME Arranged: N/A DME Agency: NA       HH Arranged: NA HH Agency: NA        Prior Living Arrangements/Services Living arrangements for the past 2 months: Single Family Home Lives with:: Spouse, Adult Children, Relatives Patient language and need for interpreter reviewed:: Yes Do you feel  safe going back to the place where you live?: Yes      Need for Family Participation in Patient Care: Yes (Comment) Care giver support system in place?: Yes (comment) Current home services: DME (rolling walkers x2) Criminal Activity/Legal Involvement Pertinent to Current Situation/Hospitalization: No - Comment as needed  Activities of Daily Living   ADL Screening (condition at time of admission) Independently performs ADLs?: Yes (appropriate for developmental age) Is the patient deaf or have difficulty hearing?: No Does the patient have difficulty seeing, even when wearing glasses/contacts?: No Does the patient have difficulty concentrating, remembering, or making decisions?: No  Permission Sought/Granted                  Emotional Assessment Appearance:: Appears stated age Attitude/Demeanor/Rapport: Engaged Affect (typically observed): Pleasant Orientation: : Oriented to Self, Oriented to Place, Oriented to  Time, Oriented to Situation Alcohol / Substance Use: Not Applicable Psych Involvement: No (comment)  Admission diagnosis:  Calculus of kidney and ureter [N20.2] Staghorn calculus [N20.0] Patient Active Problem List   Diagnosis Date Noted   Esophageal reflux 09/26/2023   Essential hypertension 09/26/2023   Diastolic dysfunction 09/26/2023   History of pulmonary embolism 09/26/2023   Hypothyroidism 09/26/2023   Venous insufficiency of lower extremity 09/26/2023   Acute respiratory failure with hypoxia (HCC) 09/26/2023   Chronic diastolic CHF  and RVF 09/26/2023   COPD/asthma 09/26/2023   CKD stage 3b, GFR 30-44 ml/min (HCC) 09/26/2023   Staghorn calculus 09/25/2023   Enteritis 10/28/2020   Abdominal pain 10/27/2020   RVF (right ventricular failure) (HCC) 03/02/2016   Morbid obesity (HCC) 03/02/2016   Right leg DVT (HCC) 03/02/2016   Subacute massive pulmonary embolism (HCC) 02/29/2016   Paroxysmal A-fib (HCC) 02/29/2016   Chest pain 11/03/2014   Dyspnea  11/02/2014   PCP:  Clarice Nottingham, MD Pharmacy:   [facility] - [place], [place] - [address] [place] [address] Phone: [phone] Fax: [phone]  [facility] - [place], [place] - [address] [place] [address] Phone: [phone] Fax: [phone]     Social Drivers of Health (SDOH) Social History: SDOH Screenings   Food Insecurity: No Food Insecurity (09/25/2023)  Housing: Low Risk  (09/25/2023)  Transportation Needs: No Transportation Needs (09/25/2023)  Utilities: Not At Risk (09/25/2023)  Social Connections: Socially Integrated (09/25/2023)  Tobacco Use: Low Risk  (09/25/2023)   SDOH Interventions:     Readmission Risk Interventions     No data to display

## 2023-09-30 NOTE — Discharge Summary (Signed)
 Physician Discharge Summary   Patient: Shannon Hammond MRN: [id number] DOB: [birth year]  Admit date:     09/25/2023  Discharge date: 09/30/2023  Discharge Physician: Yetta Blanch  PCP: Clarice Nottingham, MD  Recommendations at discharge: Follow-up with urology.  Discussed regarding timing of stopping the Eliquis . Follow-up with PCP for BMP and CBC.   Follow-up Information     Alvaro Ricardo KATHEE Raddle., MD Follow up on 10/11/2023.   Specialty: Urology Why: for next stage kidney stone surgery. call office this week to discuss preparation for the procedure including blood thinner. Contact information: [address] [place] [address] [phone]         Clarice Nottingham, MD. Schedule an appointment as soon as possible for a visit in 1 week(s).   Specialty: Internal Medicine Why: with BMP lab to look at kidney and electrolytes, with CBC lab to look at blood counts Contact information: [address] [place] [address] [phone]                Discharge Diagnoses: Principal Problem:   Staghorn calculus Active Problems:   Acute respiratory failure with hypoxia (HCC)   Paroxysmal A-fib (HCC)   History of pulmonary embolism   Chronic diastolic CHF and RVF   COPD/asthma   CKD stage 3b, GFR 30-44 ml/min (HCC)   Essential hypertension   Hypothyroidism   Esophageal reflux  Hospital Course: Patient with PMH of DVT and PE, COPD, HTN, GERD, hypothyroidism, CKD 3B, PAF anticoagulation, chronic HFpEF presented to the hospital at [facility] for management of staghorn calculus.  After her ureteroscopy patient remained hypoxic.  Developed A-fib with RVR as well as AKI on CKD and therefore hospitalist service was consulted. Patient was transferred to [facility] for cardiology consultation and further management as well. Assessment and Plan: Acute respiratory failure with hypoxia Developed persistent hypoxia with atrial fibrillation with RVR post ureteroscopy and was placed  on 3L oxygen. Concern for volume overload postprocedure.  Treated with IV Lasix . There is also concern for PE reoccurrence and patient was on IV heparin . Patient was on oral Eliquis  prior to admission which was held for surgery.  Continue Eliquis . VQ scan is ordered negative for any PE.   Staghorn calculus S/p bilateral 1st stage ureteroscopy for large volume stones by urology, Dr. Alvaro on 09/25/2023. Management per urology. Discussed with urology on 7/14.  Recommend to remove Foley catheter.  Patient was able to void after removal.  Paroxysmal A-fib with RVR. History of PAF noted in chart since 2017. She states this was during her large PE  Appreciate cardiology consultation. Treated with IV amiodarone . Currently on oral amiodarone . Was on Eliquis .  Briefly was given IV heparin .  Now back on Eliquis .   History of pulmonary embolism With hypoxia, VQ scan was ordered which is negative.  Chronic diastolic CHF and RVF Does not appear volume overloaded Echo 2017 with normal EF, grade 1 DD and moderate to severely reduced RVF  Repeat echo shows EF 50 to 55%.  No significant valvular abnormality.   COPD/asthma No evidence of exacerbation. Continue inhalers.   AKI on CKD 3B. Baseline serum creatinine appears to be around 1.2.  Postprocedure serum creatinine was 3.6.  Improved with IV diuresis. I discussed with urology they do not think that the urological procedure would result in AKI. Renal function spontaneously improving.  Volume status spontaneously improving.  Essential hypertension Blood pressure improving. Discharge on oral Lasix , Cardizem  as recommended by cardiology.   Hypothyroidism Continue  home synthroid  88mcg for now    Esophageal reflux Continue PPI   Expected hematuria. Bladder output appears to be with some red tinge. Monitor for now. No clots.  No retention  Consultants:  Urology Cardiology  Procedures performed:  Presented for cystoscopy with  pyelograms and ureteroscopy with laser lithotripsy and stent placement  DISCHARGE MEDICATION: Allergies as of 09/30/2023       Reactions   Mirabegron Itching, Rash        Medication List     STOP taking these medications    oxybutynin 10 MG 24 hr tablet Commonly known as: DITROPAN-XL       TAKE these medications    acetaminophen  325 MG tablet Commonly known as: TYLENOL  Take 650 mg by mouth every 6 (six) hours as needed for moderate pain (pain score 4-6).   alum & mag hydroxide-simeth 200-200-20 MG/5ML suspension Commonly known as: MAALOX/MYLANTA Take 15 mLs by mouth every 6 (six) hours as needed for indigestion or heartburn.   amiodarone  200 MG tablet Commonly known as: PACERONE  Take 1 tablet (200 mg total) by mouth daily. Start taking on: October 01, 2023   apixaban  5 MG Tabs tablet Commonly known as: ELIQUIS  Take 1 tablet (5 mg total) by mouth 2 (two) times daily.   B-complex with vitamin C tablet Take 1 tablet by mouth daily.   budesonide-formoterol  160-4.5 MCG/ACT inhaler Commonly known as: SYMBICORT Inhale 2 puffs into the lungs 2 (two) times daily.   cefadroxil  500 MG capsule Commonly known as: DURICEF Take 1 capsule (500 mg total) by mouth daily for 3 days.   diltiazem  120 MG 12 hr capsule Commonly known as: CARDIZEM  SR Take 1 capsule (120 mg total) by mouth 2 (two) times daily.   feeding supplement Liqd Take 237 mLs by mouth 2 (two) times daily between meals.   furosemide  40 MG tablet Commonly known as: LASIX  Take 20 mg by mouth daily.   furosemide  20 MG tablet Commonly known as: LASIX  Take 1 tablet (20 mg total) by mouth daily as needed for edema or fluid.   gabapentin  300 MG capsule Commonly known as: NEURONTIN  Take 300 mg by mouth at bedtime.   levothyroxine  88 MCG tablet Commonly known as: SYNTHROID  Take 88 mcg by mouth daily before breakfast.   magnesium  oxide 400 MG tablet Commonly known as: MAG-OX Take 400 mg by mouth daily.    omeprazole 40 MG capsule Commonly known as: PRILOSEC Take 40 mg by mouth in the morning and at bedtime.   ondansetron  4 MG disintegrating tablet Commonly known as: Zofran  ODT Take 1 tablet (4 mg total) by mouth every 8 (eight) hours as needed for nausea or vomiting.   rosuvastatin  5 MG tablet Commonly known as: CRESTOR  Take 5 mg by mouth every other day.   traMADol  50 MG tablet Commonly known as: Ultram  Take 1 tablet (50 mg total) by mouth every 6 (six) hours as needed for moderate pain (pain score 4-6) or severe pain (pain score 7-10) (post-operatively).   trimethoprim 100 MG tablet Commonly known as: TRIMPEX Take 100 mg by mouth daily.   Ventolin  HFA 108 (90 Base) MCG/ACT inhaler Generic drug: albuterol  Inhale 2 puffs into the lungs every 4 (four) hours as needed for shortness of breath.   Vitamin D3 50 MCG (2000 UT) Tabs Take 2,000 Units by mouth daily.       Disposition: Home Diet recommendation: Cardiac diet  Discharge Exam: Vitals:   09/30/23 0200 09/30/23 0400 09/30/23 0622 09/30/23 9171  BP: 125/60 135/72 (!) 141/68 127/62  Pulse: 69 65  76  Resp: 18 (!) 21  18  Temp:  98.3 F (36.8 C)  98.3 F (36.8 C)  TempSrc:  Oral  Oral  SpO2: 94% 93%  95%  Weight:      Height:       General: Appear in no distress; no visible Abnormal Neck Mass Or lumps, Conjunctiva normal Cardiovascular: S1 and S2 Present, no Murmur, Respiratory: good respiratory effort, Bilateral Air entry present and CTA, no Crackles, no wheezes Abdomen: Bowel Sound present, Non tender  Extremities: trace Pedal edema Neurology: alert and oriented to time, place, and person  [facility] Weights   09/25/23 2106 09/27/23 0206  Weight: 124.1 kg 123.6 kg   Condition at discharge: stable  The results of significant diagnostics from this hospitalization (including imaging, microbiology, ancillary and laboratory) are listed below for reference.   Imaging Studies: NM Pulmonary Perfusion Result Date:  09/27/2023 CLINICAL DATA:  Concern for pulmonary embolism. EXAM: NUCLEAR MEDICINE PERFUSION LUNG SCAN TECHNIQUE: Perfusion images were obtained in multiple projections after intravenous injection of radiopharmaceutical. RADIOPHARMACEUTICALS:  4.0 mCi Tc-65m MAA COMPARISON:  None Available. FINDINGS: No wedge-shaped peripheral perfusion defect within LEFT or RIGHT lung to suggest acute pulmonary embolism. Normal perfusion pattern. IMPRESSION: No acute pulmonary embolism. Electronically Signed   By: Jackquline Boxer M.D.   On: 09/27/2023 15:18   DG CHEST PORT 1 VIEW Result Date: 09/27/2023 CLINICAL DATA:  141880 SOB (shortness of breath) [id number] EXAM: PORTABLE CHEST - 1 VIEW COMPARISON:  09/26/2023 FINDINGS: Mild elevation of the right hemidiaphragm. Right basilar atelectasis. No focal airspace consolidation, pleural effusion, or pneumothorax. Mild cardiomegaly. Tortuous aorta with aortic atherosclerosis.no acute fracture or destructive lesion. Multilevel thoracic osteophytosis. IMPRESSION: Unchanged cardiomegaly. Otherwise, no acute cardiopulmonary abnormality. Electronically Signed   By: Rogelia Myers M.D.   On: 09/27/2023 12:37   ECHOCARDIOGRAM COMPLETE Result Date: 09/27/2023    ECHOCARDIOGRAM REPORT   Patient Name:   Shannon Hammond [facility] Date of Exam: 09/27/2023 Medical Rec #:  [id number]      Height:       66.0 in Accession #:    [id number]     Weight:       272.5 lb Date of Birth:  09-17-1945      BSA:          2.281 m Patient Age:    77 years       BP:           93/67 mmHg Patient Gender: F              HR:           128 bpm. Exam Location:  Inpatient Procedure: 2D Echo, Cardiac Doppler and Color Doppler (Both Spectral and Color            Flow Doppler were utilized during procedure). Indications:     Arrhythmia  History:         Patient has no prior history of Echocardiogram examinations.                  COPD, Arrythmias:Atrial Fibrillation; Risk                  Factors:Hypertension.  Sonographer:     Jayson Gaskins Referring Phys:  [id number] ISAIAH GERALDS Diagnosing Phys: Lonni Nanas MD IMPRESSIONS  1. Technically difficult study, limited views and patient in Afib with RVR. Left ventricular ejection fraction, by estimation, is 50-55%. The  left ventricle has grossly low normal function. Left ventricular endocardial border not optimally defined to evaluate regional wall motion. There is moderate left ventricular hypertrophy. Left ventricular diastolic parameters are indeterminate. Recommend limited echo with contrast to better evaluate LV function once heart rate improved  2. Poor RV visualization but appears mild systolic dysfunction. Right ventricular systolic function is mildly reduced. The right ventricular size is normal.  3. The mitral valve is degenerative. Trivial mitral valve regurgitation.  4. The aortic valve was not well visualized. Aortic valve regurgitation is not visualized. FINDINGS  Left Ventricle: Left ventricular ejection fraction, by estimation, is 50 to 55%. The left ventricle has low normal function. Left ventricular endocardial border not optimally defined to evaluate regional wall motion. The left ventricular internal cavity  size was normal in size. There is moderate left ventricular hypertrophy. Left ventricular diastolic parameters are indeterminate. Right Ventricle: The right ventricular size is normal. Right vetricular wall thickness was not well visualized. Right ventricular systolic function is mildly reduced. Left Atrium: Left atrial size was not well visualized. Right Atrium: Right atrial size was not well visualized. Pericardium: Trivial pericardial effusion is present. Presence of epicardial fat layer. Mitral Valve: The mitral valve is degenerative in appearance. Trivial mitral valve regurgitation. Tricuspid Valve: The tricuspid valve is normal in structure. Tricuspid valve regurgitation is trivial. Aortic Valve: The aortic valve was not well visualized. Aortic valve regurgitation  is not visualized. Pulmonic Valve: The pulmonic valve was not well visualized. Pulmonic valve regurgitation is not visualized. Aorta: The aortic root is normal in size and structure. IAS/Shunts: The interatrial septum was not well visualized.  LEFT VENTRICLE PLAX 2D LVIDd:         4.60 cm LVIDs:         3.00 cm LV PW:         1.40 cm LV IVS:        1.40 cm LVOT diam:     1.80 cm LVOT Area:     2.54 cm   AORTA Ao Root diam: 2.90 cm MITRAL VALVE               TRICUSPID VALVE MV Area (PHT): 5.79 cm    TR Peak grad:   36.2 mmHg MV Decel Time: 131 msec    TR Vmax:        301.00 cm/s MV E velocity: 86.50 cm/s                            SHUNTS                            Systemic Diam: 1.80 cm Lonni Nanas MD Electronically signed by Lonni Nanas MD Signature Date/Time: 09/27/2023/9:37:20 AM    Final (Updated)    US  RENAL Result Date: 09/26/2023 CLINICAL DATA:  Acute kidney injury EXAM: RENAL / URINARY TRACT ULTRASOUND COMPLETE COMPARISON:  CT 08/08/2023, ultrasound 06/10/2020 FINDINGS: Right Kidney: Renal measurements: 12 x 6.4 x 4.2 cm = volume: 167.5 mL. Slightly echogenic cortex. No hydronephrosis. Cyst in the upper pole measuring 17 mm, no imaging follow-up is recommended Left Kidney: Renal measurements: 11.2 x 7.4 x 4 cm = volume: 171 mL. Slightly echogenic cortex. No hydronephrosis. No mass. Bladder: Appears normal for degree of bladder distention. Other: None. IMPRESSION: No hydronephrosis. Slightly echogenic renal cortices as can be seen with medical renal disease. Electronically Signed   By: Luke Bun  M.D.   On: 09/26/2023 23:55   DG CHEST PORT 1 VIEW Result Date: 09/26/2023 CLINICAL DATA:  Acute on chronic respiratory failure with hypoxia EXAM: PORTABLE CHEST 1 VIEW COMPARISON:  Chest radiograph September 25, 2023 FINDINGS: The heart size is mildly enlarged. Increased bronchovascular markings in perihilar region and right paramediastinal with vascular cephalization indicating pulmonary  congestion. No consolidation or pulmonary nodule. No pleural effusion or pneumothorax. No acute osseous abnormality. IMPRESSION: Pulmonary congestion without definite pleural effusion. Mild cardiomegaly. Electronically Signed   By: Megan  Zare M.D.   On: 09/26/2023 17:22   DG CHEST PORT 1 VIEW Result Date: 09/25/2023 CLINICAL DATA:  Hypoxia. EXAM: PORTABLE CHEST 1 VIEW COMPARISON:  Chest radiograph dated 02/28/2016. FINDINGS: Shallow inspiration with bibasilar atelectasis. No focal consolidation, effusion or pneumothorax. Stable cardiomegaly. No acute osseous pathology. IMPRESSION: 1. No active disease. 2. Cardiomegaly. Electronically Signed   By: Vanetta Chou M.D.   On: 09/25/2023 18:28   DG C-Arm 1-60 Min-No Report Result Date: 09/25/2023 Fluoroscopy was utilized by the requesting physician.  No radiographic interpretation.    Microbiology: Results for orders placed or performed during the hospital encounter of 10/27/20  Resp Panel by RT-PCR (Flu A&B, Covid) Nasopharyngeal Swab     Status: None   Collection Time: 10/27/20  5:04 PM   Specimen: Nasopharyngeal Swab; Nasopharyngeal(NP) swabs in vial transport medium  Result Value Ref Range Status   SARS Coronavirus 2 by RT PCR NEGATIVE NEGATIVE Final    Comment: (NOTE) SARS-CoV-2 target nucleic acids are NOT DETECTED.  The SARS-CoV-2 RNA is generally detectable in upper respiratory specimens during the acute phase of infection. The lowest concentration of SARS-CoV-2 viral copies this assay can detect is 138 copies/mL. A negative result does not preclude SARS-Cov-2 infection and should not be used as the sole basis for treatment or other patient management decisions. A negative result may occur with  improper specimen collection/handling, submission of specimen other than nasopharyngeal swab, presence of viral mutation(s) within the areas targeted by this assay, and inadequate number of viral copies(<138 copies/mL). A negative result must  be combined with clinical observations, patient history, and epidemiological information. The expected result is Negative.  Fact Sheet for Patients:  BloggerCourse.com  Fact Sheet for Healthcare Providers:  SeriousBroker.it  This test is no t yet approved or cleared by the [place]  FDA and  has been authorized for detection and/or diagnosis of SARS-CoV-2 by FDA under an Emergency Use Authorization (EUA). This EUA will remain  in effect (meaning this test can be used) for the duration of the COVID-19 declaration under Section 564(b)(1) of the Act, 21 U.S.C.section 360bbb-3(b)(1), unless the authorization is terminated  or revoked sooner.       Influenza A by PCR NEGATIVE NEGATIVE Final   Influenza B by PCR NEGATIVE NEGATIVE Final    Comment: (NOTE) The Xpert Xpress SARS-CoV-2/FLU/RSV plus assay is intended as an aid in the diagnosis of influenza from Nasopharyngeal swab specimens and should not be used as a sole basis for treatment. Nasal washings and aspirates are unacceptable for Xpert Xpress SARS-CoV-2/FLU/RSV testing.  Fact Sheet for Patients: BloggerCourse.com  Fact Sheet for Healthcare Providers: SeriousBroker.it  This test is not yet approved or cleared by the [place]  FDA and has been authorized for detection and/or diagnosis of SARS-CoV-2 by FDA under an Emergency Use Authorization (EUA). This EUA will remain in effect (meaning this test can be used) for the duration of the COVID-19 declaration under Section 564(b)(1) of the Act, 21  U.S.C. section 360bbb-3(b)(1), unless the authorization is terminated or revoked.  Performed at [facility], [address], [place], [place] [address]    Labs: CBC: Recent Labs  Lab 09/26/23 1707 09/27/23 0335 09/28/23 0922 09/29/23 0413 09/30/23 0413  WBC 37.2* 39.4* 33.4* 22.2* 13.3*  NEUTROABS  32.2* 33.0*  --   --  8.2*  HGB 11.4* 12.1 12.1 11.4* 13.0  HCT 37.0 37.3 37.1 35.2* 39.1  MCV 95.9 89.9 88.3 88.7 87.1  PLT 149* 154 132* 150 152   Basic Metabolic Panel: Recent Labs  Lab 09/26/23 1707 09/26/23 2206 09/27/23 0335 09/27/23 0621 09/28/23 0922 09/29/23 0413 09/30/23 0413  NA 138   < > 140 140 141 139 137  K 5.4*   < > 5.2* 4.7 4.3 4.9 4.6  CL 104   < > 103 103 108 108 105  CO2 21*   < > 24 23 24 24 22   GLUCOSE 123*   < > 144* 132* 107* 84 94  BUN 50*   < > 52* 52* 50* 47* 45*  CREATININE 3.60*   < > 3.38* 3.13* 1.95* 1.74* 1.68*  CALCIUM  8.9   < > 9.2 9.2 9.3 9.6 10.1  MG 1.7  --   --  2.0 1.8 1.7 1.7   < > = values in this interval not displayed.   Liver Function Tests: Recent Labs  Lab 09/26/23 1707 09/27/23 0335 09/28/23 0922 09/29/23 0413  AST 75* 59* 25 19  ALT 69* 63* 40 29  ALKPHOS 117 116 117 125  BILITOT 1.2 0.9 0.6 0.5  PROT 6.6 6.6 6.4* 5.7*  ALBUMIN  3.1* 3.0* 2.7* 2.4*   CBG: Recent Labs  Lab 09/29/23 1139  GLUCAP 72    Discharge time spent: greater than 30 minutes.  Author: Yetta Blanch, MD  [facility]

## 2023-09-30 NOTE — Progress Notes (Signed)
 5 Days Post-Op   Subjective/Chief Complaint:  1 - Rt Partial Staghorn Stone In Very Comorbid Lady - s/p 1st stage RIGHT ureteroscopic stone manipulaiton and stent 09/25/23. Stent in place. Renal US  7/10 post-op no hydro.   2 - Acute on Chronic Hypoxemia / Transient Arrythmia - long h/o chronic pulm disease and morbid obesity on inhailers at home. H/o PE  x several. She unfortunately appears to have held her Eliquus (she was to remain on) pre-op. PT with sats low 80s PACU therefore observed. CV monitoring overnight 7/9 with some AFib with occasional rapid response other times sinus. CXR 7/9 unremarkable. BNP 1500. Hosp service + cards consult + transfer to [facility] 7/10 PM. VQ scan 7/11 normal. Improving on IV amio.   3 - Disposition - independent in ADL's, lives with husband at baseline, though marginal functional status.  Today Shannon Hammond is stable. Cr now back to near baseline. Working with PT. Still on IV amio.   Objective: Vital signs in last 24 hours: Temp:  [97.9 F (36.6 C)-98.4 F (36.9 C)] 98.3 F (36.8 C) (07/14 0400) Pulse Rate:  [65-72] 65 (07/14 0400) Resp:  [18-21] 21 (07/14 0400) BP: (125-152)/(59-87) 141/68 (07/14 0622) SpO2:  [93 %-96 %] 93 % (07/14 0400) Last BM Date : 09/28/23  Intake/Output from previous day: 07/13 0701 - 07/14 0700 In: 1432 [P.O.:480; I.V.:752; IV Piggyback:200] Out: 2810 [Urine:2810] Intake/Output this shift: No intake/output data recorded.  NAD  Non-labored breathing on NCO2 HR 70s Stable morbid obesity No foley Minimal LE edema, stable venous stasis changes  Lab Results:  Recent Labs    09/29/23 0413 09/30/23 0413  WBC 22.2* 13.3*  HGB 11.4* 13.0  HCT 35.2* 39.1  PLT 150 152   BMET Recent Labs    09/29/23 0413 09/30/23 0413  NA 139 137  K 4.9 4.6  CL 108 105  CO2 24 22  GLUCOSE 84 94  BUN 47* 45*  CREATININE 1.74* 1.68*  CALCIUM  9.6 10.1   PT/INR No results for input(s): LABPROT, INR in the last 72 hours. ABG No  results for input(s): PHART, HCO3 in the last 72 hours.  Invalid input(s): PCO2, PO2  Studies/Results: No results found.  Anti-infectives: Anti-infectives (From admission, onward)    Start     Dose/Rate Route Frequency Ordered Stop   09/28/23 1430  cefTRIAXone  (ROCEPHIN ) 1 g in sodium chloride  0.9 % 100 mL IVPB        1 g 200 mL/hr over 30 Minutes Intravenous Every 24 hours 09/28/23 1329     09/25/23 0939  gentamicin  (GARAMYCIN ) 5 mg/kg in dextrose  5 % 100 mL IVPB  Status:  Discontinued        5 mg/kg 100 mL/hr over 60 Minutes Intravenous 30 min pre-op 09/25/23 0939 09/25/23 0942   09/25/23 0600  gentamicin  (GARAMYCIN ) 420 mg in dextrose  5 % 100 mL IVPB        5 mg/kg  84.6 kg (Adjusted) 110.5 mL/hr over 60 Minutes Intravenous 30 min pre-op 09/24/23 9247 09/25/23 1235       Assessment/Plan:  Doing well post-op from GU perspective. Will need 2nd stage stone surgeyr as planned in few weeks pending her functional status.   Greatly appreciate hospitalist and cardiology teams management of hypoxia / arrthymia / Rt heart failure. She certainly can remain on eliquus from our perspective.   Shannon Hammond. 09/30/2023

## 2023-09-30 NOTE — Progress Notes (Addendum)
 Progress Note  Patient Name: Shannon Hammond Date of Encounter: 09/30/2023 Shannon Hammond Hammond Cardiologist: Shelda Bruckner, MD   Interval Summary     Vital Signs Vitals:   09/30/23 0200 09/30/23 0400 09/30/23 0622 09/30/23 0828  BP: 125/60 135/72 (!) 141/68 127/62  Pulse: 69 65  76  Resp: 18 (!) 21  18  Temp:  98.3 F (36.8 C)  98.3 F (36.8 C)  TempSrc:  Oral  Oral  SpO2: 94% 93%  95%  Weight:      Height:        Intake/Output Summary (Last 24 hours) at 09/30/2023 0852 Last data filed at 09/30/2023 0835 Gross per 24 hour  Intake 1191.98 ml  Output 2925 ml  Net -1733.02 ml      09/27/2023    2:06 AM 09/25/2023    9:06 PM 09/23/2023    9:10 AM  Last 3 Weights  Weight (lbs) 272 lb 8 oz 273 lb 9.5 oz 270 lb  Weight (kg) 123.605 kg 124.1 kg 122.471 kg      Telemetry/ECG   - Personally Reviewed  Physical Exam  GEN: No acute distress.  Alert and orientated on room air. Neck: No JVD Cardiac: RRR, no murmurs, rubs, or gallops.  Respiratory: Clear to auscultation bilaterally. GI: Soft, nontender, non-distended  MS: 1+ edema  Assessment & Plan  Shannon Hammond is a 78 y.o. 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 Waddell.   Bilateral nephrolithiasis s/p cystoscopy/ureteroscopy with stent placement on 09/25/23 AKI on stage IIIb CKD While having procedure performed developed A-fib and became hypotensive.  VQ scan found no PE.  AKI has improved and appears to have some underlying CKD.  No surgical procedures planned by urology this hospitalization.   Paroxysmal atrial  fibrillation Hypothyroidism CHA2DS2-VASc Score = 6 [CHF History: 1, HTN History: 1, Diabetes History: 0, Stroke History: 0, Vascular Disease History: 1 (aortic atherosclerosis), Age Score: 2, Gender Score: 1].  Therefore, the patient's annual risk of stroke is 9.7 %.  TSH within normal limits.  On levothyroxine  for hypothyroidism per primary.  Was started on IV amiodarone .  On 09/29/2023 blood pressure improved and oral Cardizem  was started.  Potassium 4.6, magnesium  1.7.  Goal potassium greater than 4, goal magnesium  greater than 2. Will replace magnesium . Continue Eliquis  5 mg twice daily (Correct dose but may need dose adjustment in 1 week when turns 78.) Stop Cardizem  60 mg every 6 hours. Restart home Cardizem  Sr 120mg  BID. Stop IV amiodarone .  Has received about 2.5 g Start oral amiodarone  200mg  daily Needs outpatient follow up with afib clinic in 2 months.   Chronic HFpEF Lymphedema Had elevated BNP 1344> 595.  Received IV Lasix .  Echo on 09/27/2023 poor quality as contrast was not used but showed low normal LVEF of 50 to 55%, moderate LVH, degenerative mitral valve, and trivial MR Prior to admission was on 20 mg Lasix  daily. Restart home 20 mg oral Lasix  daily.   Morbid obesity consider OP sleep study if not yet obtained   Otherwise manage per primary    Shannon Hammond will sign off.   The patient is ready for discharge today from a  cardiac standpoint. Medication Recommendations:  as above Other recommendations (labs, testing, etc):   Follow up as an outpatient:  afib clinic. For questions or updates, please contact [place] [facility] Please consult [email or web address] for contact info under       Signed, Morse Clause, PA-C   Personally seen and examined. Agree with above.  Would transition to PO amiodarone  200 mg daily. Follow up in AFIB clinic in 2 months. If still in NSR, consider stopping amiodarone  at that time.  Back on Eliquis  for PAF currently in NSR.  She  would need to once again hold her Eliquis  prior to further urologic procedure coming up in approximately 11 days.  Agree with consolidating her diltiazem  once again to 120 mg CD twice daily as she was on at home and restarting her home Lasix  20 mg   Shannon Parchment, MD

## 2023-09-30 NOTE — Progress Notes (Addendum)
 Prior note from Dr. Delford reviewed.   Would transition to PO amiodarone  200 mg daily. Follow up in AFIB clinic in 2 months. If still in NSR, consider stopping amiodarone  at that time.  Back on Eliquis  for PAF currently in NSR.   Please call is any questions.  Will sign off.   Oneil Parchment, MD

## 2023-09-30 NOTE — Progress Notes (Signed)
 DISCHARGE NOTE HOME Shannon Hammond to be discharged Home per MD order. Discussed prescriptions and follow up appointments with the patient. Prescriptions given to patient; medication list explained in detail. Patient verbalized understanding.  Skin clean, dry and intact without evidence of skin break down, no evidence of skin tears noted. IV catheter discontinued intact. Site without signs and symptoms of complications. Dressing and pressure applied. Pt denies pain at the site currently. No complaints noted.  Patient free of lines, drains, and wounds.   An After Visit Summary (AVS) was printed and given to the patient. Patient escorted via wheelchair, and discharged home via private auto.  Shannon SHAUNNA Pepper, RN

## 2023-09-30 NOTE — Care Management Important Message (Signed)
 Important Message  Patient Details  Name: Shannon Hammond MRN: [id number] Date of Birth: [birth year]   Important Message Given:  Yes - Medicare IM     Claretta Deed 09/30/2023, 3:43 PM

## 2023-10-01 ENCOUNTER — Telehealth: Payer: Self-pay

## 2023-10-01 NOTE — Transitions of Care (Post Inpatient/ED Visit) (Signed)
 10/01/2023  Name: Shannon Hammond MRN: [id number] DOB: 12-02-1945  Today's TOC FU Call Status: Today's TOC FU Call Status:: Successful TOC FU Call Completed TOC FU Call Complete Date: 10/01/23 Patient's Name and Date of Birth confirmed.  Transition Care Management Follow-up Telephone Call Date of Discharge: 09/30/23 Discharge Facility: [facility]) Type of Discharge: Inpatient Admission Primary Inpatient Discharge Diagnosis:: Staghorn calculus How have you been since you were released from the hospital?: Better Any questions or concerns?: No  Items Reviewed: Did you receive and understand the discharge instructions provided?: Yes Medications obtained,verified, and reconciled?: Yes (Medications Reviewed) Any new allergies since your discharge?: No Dietary orders reviewed?: Yes Type of Diet Ordered:: Heart Healthy Do you have support at home?: Yes People in Home [RPT]: spouse Name of Support/Comfort Primary Source: [place]  Medications Reviewed Today: Medications Reviewed Today     Reviewed by Nyair Depaulo, RN (Case Manager) on 10/01/23 at 1120  Med List Status: <None>   Medication Order Taking? Sig Documenting Provider Last Dose Status Informant  acetaminophen  (TYLENOL ) 325 MG tablet [id number] Yes Take 650 mg by mouth every 6 (six) hours as needed for moderate pain (pain score 4-6). [provider]  Active Self  alum & mag hydroxide-simeth (MAALOX/MYLANTA) 200-200-20 MG/5ML suspension [id number]  Take 15 mLs by mouth every 6 (six) hours as needed for indigestion or heartburn.  Patient not taking: Reported on 10/01/2023   Vann, Jessica U, DO  Active Self  amiodarone  (PACERONE ) 200 MG tablet [id number] Yes Take 1 tablet (200 mg total) by mouth daily. Tobie Yetta HERO, MD  Active   apixaban  (ELIQUIS ) 5 MG TABS tablet [id number] Yes Take 1 tablet (5 mg total) by mouth 2 (two) times daily. Vann, Jessica U, DO  Active Self  B Complex-C (B-COMPLEX WITH VITAMIN C) tablet  [id number]  Take 1 tablet by mouth daily.  Patient not taking: Reported on 10/01/2023   [provider]  Active Self           Med Note SOILA, LYLE BROCKS   Tue Sep 17, 2023  4:54 PM) ON HOLD  budesonide-formoterol  (SYMBICORT) 160-4.5 MCG/ACT inhaler [id number] Yes Inhale 2 puffs into the lungs 2 (two) times daily. [provider]  Active Self  cefadroxil  (DURICEF) 500 MG capsule [id number] Yes Take 1 capsule (500 mg total) by mouth daily for 3 days. Tobie Yetta HERO, MD  Active   Cholecalciferol (VITAMIN D3) 50 MCG (2000 UT) TABS [id number]  Take 2,000 Units by mouth daily.  Patient not taking: Reported on 10/01/2023   [provider]  Active Self           Med Note SOILA, LYLE BROCKS   Tue Sep 17, 2023  4:54 PM) ON HOLD  diltiazem  (CARDIZEM  SR) 120 MG 12 hr capsule [id number] Yes Take 1 capsule (120 mg total) by mouth 2 (two) times daily. Rizwan, Saima, MD  Active Self  feeding supplement (ENSURE PLUS HIGH PROTEIN) LIQD [id number]  Take 237 mLs by mouth 2 (two) times daily between meals.  Patient not taking: Reported on 10/01/2023   Tobie Yetta HERO, MD  Active   furosemide  (LASIX ) 20 MG tablet [id number] Yes Take 1 tablet (20 mg total) by mouth daily as needed for edema or fluid. Vann, Jessica U, DO  Active Self  furosemide  (LASIX ) 40 MG tablet [id number] Yes Take 20 mg by mouth daily. [provider]  Active Self  gabapentin  (NEURONTIN ) 300 MG capsule [id number] Yes Take 300 mg by mouth at  bedtime. [provider]  Active Self  levothyroxine  (SYNTHROID , LEVOTHROID) 88 MCG tablet [id number] Yes Take 88 mcg by mouth daily before breakfast. [provider]  Active Self  magnesium  oxide (MAG-OX) 400 MG tablet [id number]  Take 400 mg by mouth daily.  Patient not taking: Reported on 10/01/2023   [provider]  Active Self           Med Note SOILA, LYLE BROCKS   Tue Sep 17, 2023  4:54 PM) ON HOLD  omeprazole (PRILOSEC) 40 MG capsule [id number] Yes Take 40  mg by mouth in the morning and at bedtime. [provider]  Active Self  ondansetron  (ZOFRAN  ODT) 4 MG disintegrating tablet [id number] Yes Take 1 tablet (4 mg total) by mouth every 8 (eight) hours as needed for nausea or vomiting. Vann, Jessica U, DO  Active Self  rosuvastatin  (CRESTOR ) 5 MG tablet [id number] Yes Take 5 mg by mouth every other day. [provider]  Active   traMADol  (ULTRAM ) 50 MG tablet [id number] Yes Take 1 tablet (50 mg total) by mouth every 6 (six) hours as needed for moderate pain (pain score 4-6) or severe pain (pain score 7-10) (post-operatively). Alvaro Ricardo KATHEE Mickey., MD  Active   trimethoprim (TRIMPEX) 100 MG tablet [id number]  Take 100 mg by mouth daily. [provider]  Active Self  VENTOLIN  HFA 108 (90 BASE) MCG/ACT inhaler [id number]  Inhale 2 puffs into the lungs every 4 (four) hours as needed for shortness of breath.  [provider]  Active Self           Med Note MICHAELEEN, GREG A   Tue Feb 28, 2016  8:54 PM)              Home Care and Equipment/Supplies: Were Home Health Services Ordered?: NA Any new equipment or medical supplies ordered?: NA  Functional Questionnaire: Do you need assistance with bathing/showering or dressing?: No Do you need assistance with meal preparation?: No Do you need assistance with eating?: No Do you have difficulty maintaining continence: No Do you need assistance with getting out of bed/getting out of a chair/moving?: No Do you have difficulty managing or taking your medications?: No  Follow up appointments reviewed: PCP Follow-up appointment confirmed?: Yes Date of PCP follow-up appointment?: 10/03/23 Follow-up Provider: Dr. Clarice [facility] Follow-up appointment confirmed?: NA (Cystoscopy scheduled for 7/25) Do you need transportation to your follow-up appointment?: No Do you understand care options if your condition(s) worsen?: Yes-patient verbalized understanding  SDOH  Interventions Today    Flowsheet Row Most Recent Value  SDOH Interventions   Food Insecurity Interventions Intervention Not Indicated  Housing Interventions Intervention Not Indicated  Transportation Interventions Intervention Not Indicated  Utilities Interventions Intervention Not Indicated    Jessice Madill J. Braden Deloach RN, MSN [facility]  [facility], [facility] RN Care Manager Direct Dial: [phone]  Fax: [phone] Website: [email or web address]

## 2023-10-01 NOTE — Patient Instructions (Signed)
 Visit Information  Thank you for taking time to visit with me today. Please don't hesitate to contact me if I can be of assistance to you.   It is recommended to seek medical attention for kidney stones if you experience any of the following symptoms: Severe pain: Intense pain in the lower back, side, or groin that may radiate to the front of the body.  Nausea and vomiting: Pain that is accompanied by nausea, vomiting, or fever.  Blood in the urine: Pink, red, or brown urine.  Difficulty urinating: Pain or burning during urination, or a decreased ability to urinate.    Fever and chills: Signs of a possible infection   Patient verbalizes understanding of instructions and care plan provided today and agrees to view in MyChart. Active MyChart status and patient understanding of how to access instructions and care plan via MyChart confirmed with patient.     The patient has been provided with contact information for the care management team and has been advised to call with any health related questions or concerns.      Please call the Suicide and Crisis Lifeline: 988 if you are experiencing a Mental Health or Behavioral Health Crisis or need someone to talk to.  Wen Merced J. Mackenize Delgadillo RN, MSN [facility], [facility] RN Care Manager Direct Dial: [phone]  Fax: [phone] Website: [email or web address]

## 2023-10-02 ENCOUNTER — Other Ambulatory Visit: Payer: Self-pay

## 2023-10-02 NOTE — Progress Notes (Addendum)
 For Anesthesia: PCP - Ryan Hives, MD  Cardiologist - Claudene, Salena   Bowel Prep reminder: N/A  Chest x-ray - 09/27/23 in [facility] EKG - 09/26/23 in [facility] Stress Test - N/A ECHO - 09/27/23 in [facility] Cardiac Cath - N/A Pacemaker/ICD device last checked:N/A Pacemaker orders received:N/A Device Rep notified:N/A  Spinal Cord Stimulator:N/A  Sleep Study - N/A CPAP - N/A  Fasting Blood Sugar - N/A Checks Blood Sugar __N/A___ times a day Date and result of last Hgb A1c-N/A  Blood Thinner Instructions: Eliquis  Aspirin  Instructions: N/A Last Dose: 10/08/23  Activity level: 10/02/23 Lightheaded when got up to go to bathroom, low energy weak, shaky since been home from hospital.     Anesthesia review: 7/10 Hypoxia, Afib RVR, Hypotension, increased pulmonary congestion after surgery transferred to [facility]. Has follow up appt with Dr. Hives 10/03/23 at 9 AM.  Patient verbalized understanding of instructions that were reviewed over the telephone.

## 2023-10-02 NOTE — Progress Notes (Signed)
 Sent message, via epic in basket, requesting orders in epic from Careers adviser.

## 2023-10-03 DIAGNOSIS — I480 Paroxysmal atrial fibrillation: Secondary | ICD-10-CM | POA: Diagnosis not present

## 2023-10-03 DIAGNOSIS — N949 Unspecified condition associated with female genital organs and menstrual cycle: Secondary | ICD-10-CM | POA: Diagnosis not present

## 2023-10-03 DIAGNOSIS — N1832 Chronic kidney disease, stage 3b: Secondary | ICD-10-CM | POA: Diagnosis not present

## 2023-10-03 DIAGNOSIS — I5042 Chronic combined systolic (congestive) and diastolic (congestive) heart failure: Secondary | ICD-10-CM | POA: Diagnosis not present

## 2023-10-03 DIAGNOSIS — R0602 Shortness of breath: Secondary | ICD-10-CM | POA: Diagnosis not present

## 2023-10-03 DIAGNOSIS — E559 Vitamin D deficiency, unspecified: Secondary | ICD-10-CM | POA: Diagnosis not present

## 2023-10-03 DIAGNOSIS — Z09 Encounter for follow-up examination after completed treatment for conditions other than malignant neoplasm: Secondary | ICD-10-CM | POA: Diagnosis not present

## 2023-10-03 DIAGNOSIS — I872 Venous insufficiency (chronic) (peripheral): Secondary | ICD-10-CM | POA: Diagnosis not present

## 2023-10-03 DIAGNOSIS — N200 Calculus of kidney: Secondary | ICD-10-CM | POA: Diagnosis not present

## 2023-10-03 DIAGNOSIS — D72829 Elevated white blood cell count, unspecified: Secondary | ICD-10-CM | POA: Diagnosis not present

## 2023-10-04 ENCOUNTER — Telehealth: Payer: Self-pay | Admitting: Cardiology

## 2023-10-04 MED ORDER — AMIODARONE HCL 200 MG PO TABS: 200.0000 mg | ORAL_TABLET | Freq: Every day | ORAL | 1 refills | Status: DC

## 2023-10-04 NOTE — Telephone Encounter (Signed)
 Refill Amiodarone  200mg  daily for 30 tablets with 1 refill. TY!  Kenley Troop S Bryony Kaman, NP

## 2023-10-04 NOTE — Telephone Encounter (Signed)
 Order for Amiodarone  200 mg sent to patient pharmacy as requested and approved per Reche Finder, NP. Patient made aware.

## 2023-10-04 NOTE — Telephone Encounter (Signed)
 Pt c/o medication issue:  1. Name of Medication: amiodarone  (PACERONE ) 200 MG tablet   2. How are you currently taking this medication (dosage and times per day)? Take 1 tablet (200 mg total) by mouth daily.   3. Are you having a reaction (difficulty breathing--STAT)? No  4. What is your medication issue? Patient is scheduled for a hospital follow up on 11/05/23. Patient was given this medication from the hospital with no refills on 10/01/23. Patient will run out of medication before her appointment and would like to know if she should get enough to last her until her appointment or stop the medication once it runs out. Patient requested if she does not answer, please leave a detailed VM.

## 2023-10-04 NOTE — Telephone Encounter (Signed)
 Called and spoke to patient and patient has appointment scheduled with provider on 8/19. Per patient she will be 3 days short of medication and wanted refill . Made patient aware that provider will be notified. Understand verbalized.

## 2023-10-08 NOTE — Anesthesia Postprocedure Evaluation (Signed)
 Anesthesia Post Note  Patient: Shannon Hammond  Procedure(s) Performed: CYSTOSCOPY/URETEROSCOPY/HOLMIUM LASER/STENT PLACEMENT (Bilateral: Ureter) CYSTOSCOPY, WITH RETROGRADE PYELOGRAM (Bilateral: Ureter)     Patient location during evaluation: PACU Anesthesia Type: General Level of consciousness: awake Pain management: pain level controlled Vital Signs Assessment: post-procedure vital signs reviewed and stable Respiratory status: spontaneous breathing, nonlabored ventilation and respiratory function stable Cardiovascular status: blood pressure returned to baseline and stable Postop Assessment: no apparent nausea or vomiting Anesthetic complications: no   No notable events documented.  Last Vitals:  Vitals:   09/30/23 0828 09/30/23 1302  BP: 127/62 (!) 145/70  Pulse: 76 81  Resp: 18 18  Temp: 36.8 C 36.6 C  SpO2: 95%     Last Pain:  Vitals:   09/30/23 1302  TempSrc: Oral  PainSc:                  Fedrick Cefalu P Annete Ayuso

## 2023-10-09 ENCOUNTER — Encounter (HOSPITAL_COMMUNITY): Payer: Self-pay | Admitting: Urology

## 2023-10-09 ENCOUNTER — Ambulatory Visit: Payer: Self-pay

## 2023-10-09 DIAGNOSIS — R32 Unspecified urinary incontinence: Secondary | ICD-10-CM | POA: Diagnosis not present

## 2023-10-09 DIAGNOSIS — M810 Age-related osteoporosis without current pathological fracture: Secondary | ICD-10-CM | POA: Diagnosis not present

## 2023-10-09 DIAGNOSIS — I480 Paroxysmal atrial fibrillation: Secondary | ICD-10-CM | POA: Diagnosis not present

## 2023-10-09 DIAGNOSIS — J45909 Unspecified asthma, uncomplicated: Secondary | ICD-10-CM | POA: Diagnosis not present

## 2023-10-09 DIAGNOSIS — D72829 Elevated white blood cell count, unspecified: Secondary | ICD-10-CM | POA: Diagnosis not present

## 2023-10-09 DIAGNOSIS — I5042 Chronic combined systolic (congestive) and diastolic (congestive) heart failure: Secondary | ICD-10-CM | POA: Diagnosis not present

## 2023-10-09 NOTE — Progress Notes (Signed)
 Case: [id number] Date/Time: 10/11/23 1345   Procedures:      CYSTOSCOPY/URETEROSCOPY/HOLMIUM LASER/STENT PLACEMENT (Bilateral) - SECOND STAGE BILATERAL URETEROSCOPY, RETROGRADE PYELOGRAM, HOLMIUM LASER, STENT     CYSTOSCOPY, WITH RETROGRADE PYELOGRAM (Bilateral) - SECOND STAGE BILATERAL URETEROSCOPY, RETROGRADE PYELOGRAM, HOLMIUM LASER, STENT   Anesthesia type: General   Diagnosis: Calculus of kidney and ureter [N20.2]   Pre-op diagnosis: LARGE BILATERAL KIDNEY STONES   Location: [facility] 03 [facility]   Surgeons: Alvaro Ricardo KATHEE Mickey., MD       DISCUSSION: Shannon Hammond is an 78 yo female who presents to PAT prior to surgery above. PMH of HTN, aortic atherosclerosis, PAF on Eliquis , HFpEF, pulmonary HTN, asthma, hx of DVT/PE (2012, 2017), GERD, hypothyroid, CKD stage 3, arthritis, obesity (BMI 43).   Patient underwent 1st stage BILATERAL Ureteroscopic Stone Manipulation on 7/10 with Dr. Alvaro. She developed acute respiratory failure and A.fib with RVR in the PACU and was placed on 3L of O2 and given Lasix . VQ scan obtained due to hx of DVT/PE and was negative for PE. Echo on 09/27/2023 was poor quality as contrast was not used but showed low normal LVEF of 50 to 55%, moderate LVH, degenerative mitral valve, and trivial MR. Discharged on 7/14.   Seen by PCP on 7/17 for hospital f/u. Patient referred to Pulmonology due to unknown pulmonary diagnosis for which she is using albuterol  and symbicort.   Patient was following with Dr. Claudene with Cardiology however will be establishing care at [facility] - appts set up for 8/19 and 9/15. Seen by Cardiology while inpatient and started on Amiodarone  due to A.fib with RVR. Also on Eliquis  and Diltiazem .   Seen by Pulmonology remotely in 2016. Advised to treat GERD and asthma. She is prescribed inhalers. She has hx of COPD listed in her medical hx but is a nonsmoker. Does have hx of 2nd hand smoke exposure.  LD Eliquis : 7/22  VS:     10/02/2023   12:42 PM  09/30/2023    1:02 PM 09/30/2023    8:28 AM  Vitals with BMI  Height 5' 6    Weight 270 lbs    BMI 43.6    Systolic  145 127  Diastolic  70 62  Pulse  81 76     PROVIDERS: Clarice Nottingham, MD   LABS: Labs reviewed: Acceptable for surgery. Leukocytosis improving. Kidney function stable (all labs ordered are listed, but only abnormal results are displayed)  Labs Reviewed - No data to display   IMAGES: VQ scan 09/27/23:  FINDINGS: No wedge-shaped peripheral perfusion defect within LEFT or RIGHT lung to suggest acute pulmonary embolism. Normal perfusion pattern.   IMPRESSION: No acute pulmonary embolism.   CXR 09/27/23:  IMPRESSION: Unchanged cardiomegaly. Otherwise, no acute cardiopulmonary abnormality.   EKG 09/26/23:  Atrial fibrillation with premature ventricular or aberrantly conducted complexes Low voltage QRS Possible Anterolateral infarct , age undeterminedal ECG\  CV:  Echo 09/27/23:  IMPRESSIONS    1. Technically difficult study, limited views and patient in Afib with RVR. Left ventricular ejection fraction, by estimation, is 50-55%. The left ventricle has grossly low normal function. Left ventricular endocardial border not optimally defined to evaluate regional wall motion. There is moderate left ventricular hypertrophy. Left ventricular diastolic parameters are indeterminate. Recommend limited echo with contrast to better evaluate LV function once heart rate improved  2. Poor RV visualization but appears mild systolic dysfunction. Right ventricular systolic function is mildly reduced. The right ventricular size is normal.  3.  The mitral valve is degenerative. Trivial mitral valve regurgitation.  4. The aortic valve was not well visualized. Aortic valve regurgitation is not visualized.  Echo 02/28/2016:   Study Conclusions   - Left ventricle: The cavity size was normal. There was mild   concentric hypertrophy. Systolic function was normal. The    estimated ejection fraction was in the range of 60% to 65%. Wall   motion was normal; there were no regional wall motion   abnormalities. Doppler parameters are consistent with abnormal   left ventricular relaxation (grade 1 diastolic dysfunction). - Mitral valve: There was mild regurgitation. - Left atrium: The atrium was mildly dilated. - Right ventricle: The cavity size was moderately dilated. Wall   thickness was normal. Systolic function was moderately to   severely reduced. - Tricuspid valve: There was moderate regurgitation. - Pulmonary arteries: Systolic pressure was moderately increased.   PA peak pressure: 51 mm Hg (S).   Impressions:   - Right ventricular dysfunction.  Past Medical History:  Diagnosis Date   Arthritis    BACK AND KNEES   Asthma    COPD (chronic obstructive pulmonary disease) (HCC)    DVT (deep venous thrombosis) (HCC)    GERD (gastroesophageal reflux disease)    Heart murmur    Hemorrhoids    History of kidney stones    History of nocturia    Hypertension    Hypothyroidism    PAF (paroxysmal atrial fibrillation) (HCC)    Recurrent pulmonary emboli (HCC)    Seizures (HCC) 03/19/2005   ONLY ONCE - THOUGHT TO BE STRESS INDUCED - PT WAS DEALING WITH HER MOTHER'S DEATH   Shortness of breath    PT TOLD BY HER MEDICAL DOCTOR - SOME LUNG CHANGES DUE TO 2ND HAND SMOKE - SHE WAS GIVEN INHALER TO USE AS NEEDED.    Past Surgical History:  Procedure Laterality Date   BREAST SURGERY  1993   CYST REMOVED LEFT BREAST   CHOLECYSTECTOMY  LATE 1990'S   CYSTOSCOPY W/ RETROGRADES Bilateral 09/25/2023   Procedure: CYSTOSCOPY, WITH RETROGRADE PYELOGRAM;  Surgeon: Alvaro Ricardo KATHEE Mickey., MD;  Location: [facility];  Service: Urology;  Laterality: Bilateral;  FIRST STAGE BILATERAL URETEROSCOPY, RETROGRADE PYELOGRAM, HOLMIUM LASER, STENT   CYSTOSCOPY WITH RETROGRADE PYELOGRAM, URETEROSCOPY AND STENT PLACEMENT Right 11/27/2012   Procedure: CYSTOSCOPY WITH RETROGRADE PYELOGRAM,  URETEROSCOPY, stone basketry AND STENT PLACEMENT;  Surgeon: Garnette Shack, MD;  Location[facility];  Service: Urology;  Laterality: Right;   CYSTOSCOPY/URETEROSCOPY/HOLMIUM LASER/STENT PLACEMENT Bilateral 09/25/2023   Procedure: CYSTOSCOPY/URETEROSCOPY/HOLMIUM LASER/STENT PLACEMENT;  Surgeon: Alvaro Ricardo KATHEE Mickey., MD;  [facility];  Service: Urology;  Laterality: Bilateral;  FIRST STAGE RIGHT URETEROSCOPY, LEFT DIAGNOSTIC URETEROSCOPY, BILATERAL RETROGRADE PYELOGRAM, HOLMIUM LASER, RIGHT URETERAL STENT   HOLMIUM LASER APPLICATION Right 11/27/2012   Procedure: HOLMIUM LASER APPLICATION;  Surgeon: Garnette Shack, MD;  [facility];  Service: Urology;  Laterality: Right;   LITHOTRIPSY     BOTH KIDNEYS    MEDICATIONS: No current facility-administered medications for this encounter.    acetaminophen  (TYLENOL ) 325 MG tablet   apixaban  (ELIQUIS ) 5 MG TABS tablet   budesonide-formoterol  (SYMBICORT) 160-4.5 MCG/ACT inhaler   diltiazem  (CARDIZEM  SR) 120 MG 12 hr capsule   furosemide  (LASIX ) 40 MG tablet   gabapentin  (NEURONTIN ) 300 MG capsule   levothyroxine  (SYNTHROID , LEVOTHROID) 88 MCG tablet   omeprazole (PRILOSEC) 40 MG capsule   ondansetron  (ZOFRAN  ODT) 4 MG disintegrating tablet   trimethoprim (TRIMPEX) 100 MG tablet   VENTOLIN  HFA 108 (  90 BASE) MCG/ACT inhaler   alum & mag hydroxide-simeth (MAALOX/MYLANTA) 200-200-20 MG/5ML suspension   amiodarone  (PACERONE ) 200 MG tablet   B Complex-C (B-COMPLEX WITH VITAMIN C) tablet   Cholecalciferol (VITAMIN D3) 50 MCG (2000 UT) TABS   feeding supplement (ENSURE PLUS HIGH PROTEIN) LIQD   furosemide  (LASIX ) 20 MG tablet   magnesium  oxide (MAG-OX) 400 MG tablet   rosuvastatin  (CRESTOR ) 5 MG tablet   traMADol  (ULTRAM ) 50 MG tablet   Shenia Alan M Baylea Milburn, PA-C [facility] Short Stay/Anesthesiology [facility] Phone [phone] 10/09/2023 10:58 AM

## 2023-10-09 NOTE — Telephone Encounter (Signed)
 FYI Only or Action Required?: FYI only for provider.  Called Nurse Triage reporting Shortness of Breath.  Symptoms began several years ago.  Interventions attempted: Prescription medications: inhalers.  Symptoms are: stable.  Triage Disposition: See PCP Within 2 Weeks  Patient/caregiver understands and will follow disposition?:     Copied from CRM #[id number]. Topic: Clinical - Red Word Triage >> Oct 09, 2023 11:31 AM Shannon Hammond wrote: Red Word that prompted transfer to Nurse Triage: Non established pt was in the hospital on 7/9 for kidney stones and had her oxygen level drop while her heart rate went up high. Pt is currently still having SOB and weakness. Patient stated she's had two experiences as late as 2010 with blood clots. Pt stated her referral will be coming from Dr. Clarice, but I didn't see it in her chart yet.    Reason for Disposition  [1] MILD longstanding difficulty breathing (e.g., minimal/no SOB at rest, SOB with walking, pulse < 100) AND [2] SAME as normal  Answer Assessment - Initial Assessment Questions 1. RESPIRATORY STATUS: Describe your breathing? (e.g., wheezing, shortness of breath, unable to speak, severe coughing)      Shortness of breath, no worse than her normal  2. ONSET: When did this breathing problem begin?      Going on for years  3. PATTERN Does the difficult breathing come and go, or has it been constant since it started?      Intermittent  4. SEVERITY: How bad is your breathing? (e.g., mild, moderate, severe)      Mild to moderate  5. RECURRENT SYMPTOM: Have you had difficulty breathing before? If Yes, ask: When was the last time? and What happened that time?      Yes, has inhalers  6. CARDIAC HISTORY: Do you have any history of heart disease? (e.g., heart attack, angina, bypass surgery, angioplasty)      Yes 7. LUNG HISTORY: Do you have any history of lung disease?  (e.g., pulmonary embolus, asthma, emphysema)     Yes 8. CAUSE:  What do you think is causing the breathing problem?      COPD/Asthma  9. OTHER SYMPTOMS: Do you have any other symptoms? (e.g., chest pain, cough, dizziness, fever, runny nose)     Feels fatigued still from her recent admission  10. O2 SATURATION MONITOR:  Do you use an oxygen saturation monitor (pulse oximeter) at home? If Yes, ask: What is your reading (oxygen level) today? What is your usual oxygen saturation reading? (e.g., 95%)       No  Protocols used: Breathing Difficulty-A-AH

## 2023-10-11 ENCOUNTER — Ambulatory Visit (HOSPITAL_COMMUNITY): Payer: Self-pay | Admitting: Physician Assistant

## 2023-10-11 ENCOUNTER — Other Ambulatory Visit: Payer: Self-pay | Admitting: Urology

## 2023-10-11 ENCOUNTER — Ambulatory Visit (HOSPITAL_COMMUNITY)

## 2023-10-11 ENCOUNTER — Encounter (HOSPITAL_COMMUNITY)
Admission: RE | Disposition: A | Discharge status: Home / Self Care | Payer: Self-pay | Source: Home / Self Care | Attending: Urology

## 2023-10-11 ENCOUNTER — Encounter (HOSPITAL_COMMUNITY): Payer: Self-pay | Admitting: Urology

## 2023-10-11 ENCOUNTER — Ambulatory Visit (HOSPITAL_COMMUNITY)
Admission: RE | Admit: 2023-10-11 | Discharge: 2023-10-11 | Disposition: A | Discharge status: Home / Self Care | Attending: Urology | Admitting: Urology

## 2023-10-11 DIAGNOSIS — I272 Pulmonary hypertension, unspecified: Secondary | ICD-10-CM | POA: Insufficient documentation

## 2023-10-11 DIAGNOSIS — G40909 Epilepsy, unspecified, not intractable, without status epilepticus: Secondary | ICD-10-CM | POA: Diagnosis not present

## 2023-10-11 DIAGNOSIS — Z7722 Contact with and (suspected) exposure to environmental tobacco smoke (acute) (chronic): Secondary | ICD-10-CM | POA: Insufficient documentation

## 2023-10-11 DIAGNOSIS — K219 Gastro-esophageal reflux disease without esophagitis: Secondary | ICD-10-CM | POA: Insufficient documentation

## 2023-10-11 DIAGNOSIS — N1832 Chronic kidney disease, stage 3b: Secondary | ICD-10-CM | POA: Diagnosis not present

## 2023-10-11 DIAGNOSIS — Z7951 Long term (current) use of inhaled steroids: Secondary | ICD-10-CM | POA: Diagnosis not present

## 2023-10-11 DIAGNOSIS — I130 Hypertensive heart and chronic kidney disease with heart failure and stage 1 through stage 4 chronic kidney disease, or unspecified chronic kidney disease: Secondary | ICD-10-CM | POA: Insufficient documentation

## 2023-10-11 DIAGNOSIS — E039 Hypothyroidism, unspecified: Secondary | ICD-10-CM | POA: Insufficient documentation

## 2023-10-11 DIAGNOSIS — Z6841 Body Mass Index (BMI) 40.0 and over, adult: Secondary | ICD-10-CM | POA: Insufficient documentation

## 2023-10-11 DIAGNOSIS — N183 Chronic kidney disease, stage 3 unspecified: Secondary | ICD-10-CM | POA: Diagnosis not present

## 2023-10-11 DIAGNOSIS — I5032 Chronic diastolic (congestive) heart failure: Secondary | ICD-10-CM

## 2023-10-11 DIAGNOSIS — N200 Calculus of kidney: Secondary | ICD-10-CM

## 2023-10-11 DIAGNOSIS — Z86718 Personal history of other venous thrombosis and embolism: Secondary | ICD-10-CM | POA: Diagnosis not present

## 2023-10-11 DIAGNOSIS — N202 Calculus of kidney with calculus of ureter: Secondary | ICD-10-CM | POA: Diagnosis not present

## 2023-10-11 DIAGNOSIS — I480 Paroxysmal atrial fibrillation: Secondary | ICD-10-CM | POA: Diagnosis not present

## 2023-10-11 DIAGNOSIS — Z86711 Personal history of pulmonary embolism: Secondary | ICD-10-CM | POA: Diagnosis not present

## 2023-10-11 DIAGNOSIS — J4489 Other specified chronic obstructive pulmonary disease: Secondary | ICD-10-CM | POA: Insufficient documentation

## 2023-10-11 DIAGNOSIS — Z79899 Other long term (current) drug therapy: Secondary | ICD-10-CM | POA: Diagnosis not present

## 2023-10-11 DIAGNOSIS — I083 Combined rheumatic disorders of mitral, aortic and tricuspid valves: Secondary | ICD-10-CM | POA: Diagnosis not present

## 2023-10-11 DIAGNOSIS — Z7901 Long term (current) use of anticoagulants: Secondary | ICD-10-CM | POA: Insufficient documentation

## 2023-10-11 DIAGNOSIS — I5030 Unspecified diastolic (congestive) heart failure: Secondary | ICD-10-CM | POA: Insufficient documentation

## 2023-10-11 HISTORY — PX: CYSTOSCOPY/URETEROSCOPY/HOLMIUM LASER/STENT PLACEMENT: SHX6546

## 2023-10-11 HISTORY — PX: CYSTOSCOPY W/ RETROGRADES: SHX1426

## 2023-10-11 SURGERY — CYSTOSCOPY/URETEROSCOPY/HOLMIUM LASER/STENT PLACEMENT
Anesthesia: General | Site: Pelvis | Laterality: Bilateral

## 2023-10-11 MED ORDER — OXYCODONE HCL 5 MG/5ML PO SOLN: 5.0000 mg | Freq: Once | ORAL | Status: DC | PRN

## 2023-10-11 MED ORDER — DEXAMETHASONE SODIUM PHOSPHATE 10 MG/ML IJ SOLN
INTRAMUSCULAR | Status: AC
  Filled 2023-10-11: qty 1

## 2023-10-11 MED ORDER — ACETAMINOPHEN 10 MG/ML IV SOLN
INTRAVENOUS | Status: DC | PRN
  Administered 2023-10-11: 1000 mg via INTRAVENOUS

## 2023-10-11 MED ORDER — ORAL CARE MOUTH RINSE: 15.0000 mL | Freq: Once | OROMUCOSAL | Status: AC

## 2023-10-11 MED ORDER — FENTANYL CITRATE PF 50 MCG/ML IJ SOSY: 25.0000 ug | PREFILLED_SYRINGE | INTRAMUSCULAR | Status: DC | PRN

## 2023-10-11 MED ORDER — ONDANSETRON HCL 4 MG/2ML IJ SOLN: 4.0000 mg | Freq: Once | INTRAMUSCULAR | Status: DC | PRN

## 2023-10-11 MED ORDER — ONDANSETRON HCL 4 MG/2ML IJ SOLN
INTRAMUSCULAR | Status: AC
  Filled 2023-10-11: qty 2

## 2023-10-11 MED ORDER — IOHEXOL 300 MG/ML  SOLN
INTRAMUSCULAR | Status: DC | PRN
  Administered 2023-10-11: 15 mL

## 2023-10-11 MED ORDER — PHENYLEPHRINE 80 MCG/ML (10ML) SYRINGE FOR IV PUSH (FOR BLOOD PRESSURE SUPPORT)
PREFILLED_SYRINGE | INTRAVENOUS | Status: DC | PRN
  Administered 2023-10-11 (×4): 80 ug via INTRAVENOUS

## 2023-10-11 MED ORDER — CHLORHEXIDINE GLUCONATE 0.12 % MT SOLN
15.0000 mL | Freq: Once | OROMUCOSAL | Status: AC
  Administered 2023-10-11: 15 mL via OROMUCOSAL

## 2023-10-11 MED ORDER — FENTANYL CITRATE (PF) 100 MCG/2ML IJ SOLN
INTRAMUSCULAR | Status: AC
  Filled 2023-10-11: qty 2

## 2023-10-11 MED ORDER — ONDANSETRON HCL 4 MG/2ML IJ SOLN
INTRAMUSCULAR | Status: DC | PRN
  Administered 2023-10-11: 4 mg via INTRAVENOUS

## 2023-10-11 MED ORDER — LIDOCAINE HCL (CARDIAC) PF 100 MG/5ML IV SOSY
PREFILLED_SYRINGE | INTRAVENOUS | Status: DC | PRN
  Administered 2023-10-11: 100 mg via INTRAVENOUS

## 2023-10-11 MED ORDER — FENTANYL CITRATE (PF) 100 MCG/2ML IJ SOLN
INTRAMUSCULAR | Status: DC | PRN
  Administered 2023-10-11: 25 ug via INTRAVENOUS

## 2023-10-11 MED ORDER — ACETAMINOPHEN 10 MG/ML IV SOLN
1000.0000 mg | Freq: Once | INTRAVENOUS | Status: DC | PRN
Start: 2023-10-11 — End: 2023-10-11

## 2023-10-11 MED ORDER — CEPHALEXIN 500 MG PO CAPS: 500.0000 mg | ORAL_CAPSULE | Freq: Two times a day (BID) | ORAL | 0 refills | Status: DC

## 2023-10-11 MED ORDER — PROPOFOL 10 MG/ML IV BOLUS
INTRAVENOUS | Status: AC
Start: 2023-10-11 — End: 2023-10-11
  Filled 2023-10-11: qty 20

## 2023-10-11 MED ORDER — CEFAZOLIN SODIUM-DEXTROSE 2-4 GM/100ML-% IV SOLN
INTRAVENOUS | Status: AC
  Filled 2023-10-11: qty 100

## 2023-10-11 MED ORDER — LACTATED RINGERS IV SOLN: INTRAVENOUS | Status: DC

## 2023-10-11 MED ORDER — OXYCODONE HCL 5 MG PO TABS: 5.0000 mg | ORAL_TABLET | Freq: Once | ORAL | Status: DC | PRN

## 2023-10-11 MED ORDER — PROPOFOL 10 MG/ML IV BOLUS
INTRAVENOUS | Status: DC | PRN
  Administered 2023-10-11: 120 mg via INTRAVENOUS

## 2023-10-11 MED ORDER — ACETAMINOPHEN 10 MG/ML IV SOLN
INTRAVENOUS | Status: AC
  Filled 2023-10-11: qty 100

## 2023-10-11 MED ORDER — DEXAMETHASONE SODIUM PHOSPHATE 10 MG/ML IJ SOLN
INTRAMUSCULAR | Status: DC | PRN
  Administered 2023-10-11: 10 mg via INTRAVENOUS

## 2023-10-11 MED ORDER — LIDOCAINE HCL (PF) 2 % IJ SOLN
INTRAMUSCULAR | Status: AC
Start: 2023-10-11 — End: 2023-10-11
  Filled 2023-10-11: qty 5

## 2023-10-11 MED ORDER — SODIUM CHLORIDE 0.9 % IR SOLN
Status: DC | PRN
  Administered 2023-10-11: 3000 mL via INTRAVESICAL

## 2023-10-11 MED ORDER — CEFAZOLIN SODIUM-DEXTROSE 3-4 GM/150ML-% IV SOLN
3.0000 g | Freq: Once | INTRAVENOUS | Status: AC
  Administered 2023-10-11: 3 g via INTRAVENOUS

## 2023-10-11 SURGICAL SUPPLY — 20 items
BAG URO CATCHER STRL LF (MISCELLANEOUS) ×1 IMPLANT
BASKET LASER NITINOL 1.9FR (BASKET) IMPLANT
BASKET STONE NCOMPASS (UROLOGICAL SUPPLIES) IMPLANT
CATH URETL OPEN END 6FR 70 (CATHETERS) ×1 IMPLANT
CLOTH BEACON ORANGE TIMEOUT ST (SAFETY) ×1 IMPLANT
GLOVE SURG LX STRL 7.5 STRW (GLOVE) ×1 IMPLANT
GOWN STRL REUS W/ TWL XL LVL3 (GOWN DISPOSABLE) ×1 IMPLANT
GUIDEWIRE ANG ZIPWIRE 038X150 (WIRE) ×1 IMPLANT
GUIDEWIRE STR DUAL SENSOR (WIRE) ×1 IMPLANT
KIT TURNOVER KIT A (KITS) ×1 IMPLANT
MANIFOLD NEPTUNE II (INSTRUMENTS) ×1 IMPLANT
NS IRRIG 1000ML POUR BTL (IV SOLUTION) IMPLANT
PACK CYSTO (CUSTOM PROCEDURE TRAY) ×1 IMPLANT
SHEATH NAVIGATOR HD 11/13X28 (SHEATH) IMPLANT
SHEATH NAVIGATOR HD 11/13X36 (SHEATH) IMPLANT
STENT URET 6FRX24 CONTOUR (STENTS) IMPLANT
TRACTIP FLEXIVA PULS ID 200XHI (Laser) IMPLANT
TUBE PU 8FR 16IN ENFIT (TUBING) ×1 IMPLANT
TUBING CONNECTING 10 (TUBING) ×1 IMPLANT
TUBING UROLOGY SET (TUBING) ×1 IMPLANT

## 2023-10-11 NOTE — H&P (Signed)
 Shannon Hammond is an 78 y.o. female.    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 Most recnet clonal CX 2024 proteus sens amp, cipro , gent, bactrim. No febrile pyelo admissions. UCX 08/2023 non-clonal.   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 She is morbidly obese on blood thinners. Skin to stone distance 14cm. 1st stage stone surgery 09/30/23 and felt that >60% stone volume addressed.   PMH sig for morbid obesity, CHF/Lasix , PE/Eliquus, lap chole. INdependatn in all ADL's lives with husband and daughter. Retired from [employer]. Her PCP is Ryan Hives MD. Her cardiologist is [place].   Today  Shannon Hammond  is seen to proceed with RIGHT 2nd stage ureteroscopy for large volume stones that are likelky colonized. Her 1st stage surgery was complicated by volume overload / hypoxia / arrthymia and resulted in in-house hospital stay with extensive pulm and CV work-up. She remains very risky operative candidate with poor functional capacity but is at baseline. Her Rt ureteral stent remains in situ.   Past Medical History:  Diagnosis Date   Arthritis    BACK AND KNEES   Asthma    COPD (chronic obstructive pulmonary disease) (HCC)    DVT (deep venous thrombosis) (HCC)    GERD (gastroesophageal reflux disease)    Heart murmur    Hemorrhoids    History of kidney stones    History of nocturia    Hypertension    Hypothyroidism    PAF (paroxysmal atrial fibrillation) (HCC)    Recurrent pulmonary emboli (HCC)    Seizures (HCC) 03/19/2005   ONLY ONCE - THOUGHT TO BE STRESS INDUCED - PT WAS DEALING WITH HER MOTHER'S DEATH   Shortness of breath    PT TOLD BY HER MEDICAL DOCTOR - SOME LUNG CHANGES DUE TO 2ND HAND SMOKE - SHE WAS GIVEN INHALER TO USE AS NEEDED.    Past Surgical History:  Procedure Laterality  Date   BREAST SURGERY  1993   CYST REMOVED LEFT BREAST   CHOLECYSTECTOMY  LATE 1990'S   CYSTOSCOPY W/ RETROGRADES Bilateral 09/25/2023   Procedure: CYSTOSCOPY, WITH RETROGRADE PYELOGRAM;  Surgeon: Alvaro Ricardo KATHEE Mickey., MD;  [facility];  Service: Urology;  Laterality: Bilateral;  FIRST STAGE BILATERAL URETEROSCOPY, RETROGRADE PYELOGRAM, HOLMIUM LASER, STENT   CYSTOSCOPY WITH RETROGRADE PYELOGRAM, URETEROSCOPY AND STENT PLACEMENT Right 11/27/2012   Procedure: CYSTOSCOPY WITH RETROGRADE PYELOGRAM, URETEROSCOPY, stone basketry AND STENT PLACEMENT;  Surgeon: Garnette Shack, MD;  [facility];  Service: Urology;  Laterality: Right;   CYSTOSCOPY/URETEROSCOPY/HOLMIUM LASER/STENT PLACEMENT Bilateral 09/25/2023   Procedure: CYSTOSCOPY/URETEROSCOPY/HOLMIUM LASER/STENT PLACEMENT;  Surgeon: Alvaro Ricardo KATHEE Mickey., MD;  [facility];  Service: Urology;  Laterality: Bilateral;  FIRST STAGE RIGHT URETEROSCOPY, LEFT DIAGNOSTIC URETEROSCOPY, BILATERAL RETROGRADE PYELOGRAM, HOLMIUM LASER, RIGHT URETERAL STENT   HOLMIUM LASER APPLICATION Right 11/27/2012   Procedure: HOLMIUM LASER APPLICATION;  Surgeon: Garnette Shack, MD;  [facility];  Service: Urology;  Laterality: Right;   LITHOTRIPSY     BOTH KIDNEYS    Family History  Problem Relation Age of Onset   Heart attack Father    Clotting disorder Neg Hx    Social History:  reports that she has never smoked. She has never used smokeless tobacco. She reports that she does not drink alcohol and does not use drugs.  Allergies:  Allergies  Allergen Reactions   Mirabegron Itching and Rash    No medications prior to admission.    No results found for this or any previous visit (from the past 48 hours). No results found.  Review of Systems  Constitutional:  Negative for chills and fever.  Genitourinary:  Positive for urgency.  All other systems reviewed and are negative.   Height 5' 6 (1.676 m), weight 122.5 kg. Physical Exam Vitals  reviewed.  Constitutional:      Comments: At baseline. Stable obese frailty.   HENT:     Head: Normocephalic.  Eyes:     Pupils: Pupils are equal, round, and reactive to light.  Pulmonary:     Comments: Non-labored on room air.  Abdominal:     Comments: Stable very large truncal obesity.   Genitourinary:    Comments: No CVAT at present.  Musculoskeletal:        General: Normal range of motion.  Neurological:     Mental Status: She is alert.  Psychiatric:        Mood and Affect: Mood normal.      Assessment/Plan  Proceed as planned with 2nd stage RIGHT ureteroscopic stone manipulation with goal of stone free and reduce colonized nidus for infections. Risks, benefits, alternatives, expected peri-op course discussed. She and her family understand that her age, major obesity / pulm / CV comorbidity increases risk of ALL peri-op complications including mortality.   Ricardo KATHEE Alvaro Mickey., MD 10/11/2023, 7:00 AM

## 2023-10-11 NOTE — Op Note (Signed)
 NAME: Shannon Hammond, LITTLE MEDICAL RECORD NO: [id number] ACCOUNT NO: [id number] DATE OF BIRTH: [birth year] FACILITY: [facility] LOCATION: [facility] PHYSICIAN: Ricardo Likens, MD  Operative Report   DATE OF PROCEDURE: 10/11/2023  PREOPERATIVE DIAGNOSIS:  Residual right renal stone status post first-stage procedure.  PROCEDURE PERFORMED: 1.  Cystoscopy, right retrograde pyelogram interpretation. 2.  Right ureteroscopy with laser lithotripsy, second stage. 3.  Exchange of right ureteral stent.  ESTIMATED BLOOD LOSS:  Nil.  COMPLICATION:  None.  SPECIMENS:  Residual right renal stone fragments for discard.  FINDINGS: 1.  Significant interval resolution of prior right renal stone fragments with interval stenting. 2.  Complete resolution of all accessible stone fragments larger than 1/3 mm following laser lithotripsy and basket extraction. 3.  Successful placement of right ureteral stent, proximal end in renal pelvis and distal end in urinary bladder.  No tether.  INDICATIONS:  The patient is a very pleasant but quite comorbid 78 year old lady with a history of severe recurrent urinary tract infections.  She has a known right renal stone that is likely colonized.  Options discussed management including continued  care for cystourethroscopy with goal stone free to reduce her colonized nidus and she wished to proceed.  She underwent a first-stage procedure on 09/30/2023, which was complicated by some congestive heart failure exacerbation requiring inpatient  admission and diuresis, additional cardiovascular workup, which fortunately was unremarkable.  She has now diuresed approximately 12 pounds.  Pulmonary status improved significantly.  She presents for a second stage procedure today with the goal of stone  free.  Informed consent was obtained and placed in the medical record.  DESCRIPTION OF PROCEDURE:  The patient being identified and verified, procedure being right second-stage ureteroscopic stone  manipulation was confirmed, cystoscopy was performed.  Intravenous antibiotics administered, general LMA anesthesia introduced.   The patient was placed into a low lithotomy position.  Sterile field was created.  Prepped and draped the patient's vagina, introitus, and proximal thighs using iodine, and cystourethroscopy was performed using 21-French rigid cystoscope with offset  lens. Inspection of the urinary bladder revealed no diverticula, calcification, or papillary lesions.  The distal end of right stent was seen, grasped, and brought to the level of the urethral meatus.  A 0.038 ZIPwire with advanced to the level of the  upper pole set aside as safety wire exchanged for an open-ended catheter and right retrograde pyelogram was obtained.  Right retrograde pyelogram demonstrated a single right ureter and single system right kidney.  No obvious filling defects or narrowing noted.  A ZIPwire was once again advanced and set aside as a safety wire.  An 8-French feeding tube was placed in the  urinary bladder for pressure release.  A semi-rigid ureteroscopy was performed of the distal portion of the right ureter alongside as separate sensor working wire.  No mucosal abnormalities were found.  The semi-rigid scope was then exchanged for a  short-length ureteral access sheath to the level of the proximal ureter.  Using continuous fluoroscopic guidance, flexible digital ureteroscopy was performed in the proximal ureter and systemic inspection of the right kidney including all calyces x3.   There were multifocal residual stone fragments as anticipated mostly in the lower pole and upper poles respectively.  There were a few dominant fragments.  They were too large for simple basketing and holmium laser energy was applied to stone with  settings of 0.2 joules and 20 Hz.  Using a dusting and fragmentation technique, the dominant foci were fragmented into fragments 1-2 mm  or less.  Next, the NCompass basket was used  with multiple passes using a Net technique and all amenable stone  fragments were carefully removed.  It is estimated that at least 1 cm worth of total stone volume was retrieved.  Following this, complete resolution of all accessible stone fragments larger than 1/3 mm.  Excellent hemostasis.  No evidence of  perforation.  We achieved the goal of the single staged procedure today and it is not felt that she will require additional manipulation of this.  Access sheath was removed under continuous vision.  No significant mucosal abnormalities were found.  Given  the large volume stone and her tendency for infectious parameters, it was felt that interval stenting with non-tether stent would be most prudent and a new 6 x 24 contour-type stent was carefully placed.  Using fluoroscopic guidance, good proximal and  distal plane were noted and the procedure was terminated.  The patient tolerated the procedure well.  No immediate periprocedural complications.  The patient was taken to the post-anesthesia care unit in stable condition with plan for discharge home  pending her pulmonary status in the recovery room.   PUS D: 10/11/2023 11:53:51 am T: 10/11/2023 4:54:00 pm  JOB: [id number]/ [id number]

## 2023-10-11 NOTE — Anesthesia Preprocedure Evaluation (Addendum)
 Anesthesia Evaluation  Patient identified by MRN, date of birth, ID band Patient awake  General Assessment Comment:  Per PAT note: Shannon Hammond is an 78 yo female who presents to PAT prior to surgery above. PMH of HTN, aortic atherosclerosis, PAF on Eliquis , HFpEF, pulmonary HTN, asthma, hx of DVT/PE (2012, 2017), GERD, hypothyroid, CKD stage 3, arthritis, obesity (BMI 43).    Patient underwent 1st stage BILATERAL Ureteroscopic Stone Manipulation on 7/10 with Dr. Alvaro. She developed acute respiratory failure and A.fib with RVR in the PACU and was placed on 3L of O2 and given Lasix . VQ scan obtained due to hx of DVT/PE and was negative for PE. Echo on 09/27/2023 was poor quality as contrast was not used but showed low normal LVEF of 50 to 55%, moderate LVH, degenerative mitral valve, and trivial MR. Discharged on 7/14.    Seen by PCP on 7/17 for hospital f/u. Patient referred to Pulmonology due to unknown pulmonary diagnosis for which she is using albuterol  and symbicort.   Patient was following with Dr. Claudene with Cardiology however will be establishing care at [facility] - appts set up for 8/19 and 9/15. Seen by Cardiology while inpatient and started on Amiodarone  due to A.fib with RVR. Also on Eliquis  and Diltiazem .   Seen by Pulmonology remotely in 2016. Advised to treat GERD and asthma. She is prescribed inhalers. She has hx of COPD listed in her medical hx but is a nonsmoker. Does have hx of 2nd hand smoke exposure.   Reviewed: Allergy & Precautions, NPO status , Patient's Chart, lab work & pertinent test results  History of Anesthesia Complications Negative for: history of anesthetic complications  Airway Mallampati: III  TM Distance: >3 FB Neck ROM: Full    Dental  (+) Edentulous Upper, Missing, Poor Dentition   Pulmonary shortness of breath and with exertion, asthma , neg sleep apnea, COPD,  COPD inhaler, Patient abstained from smoking.Not  current smoker, PE    + decreased breath sounds      Cardiovascular Exercise Tolerance: Poor METShypertension, Pt. on medications +CHF and + DVT  (-) CAD and (-) Past MI + dysrhythmias Atrial Fibrillation  Rhythm:Irregular Rate:Normal     Neuro/Psych Seizures -, Well Controlled,   negative psych ROS   GI/Hepatic Neg liver ROS,GERD  Controlled and Medicated,,  Endo/Other  neg diabetesHypothyroidism  Class 3 obesity  Renal/GU CRFRenal disease     Musculoskeletal  (+) Arthritis ,    Abdominal  (+) + obese  Peds  Hematology  (+) Blood dyscrasia (Eliquis )   Anesthesia Other Findings Past Medical History: No date: Arthritis     Comment:  BACK AND KNEES No date: Asthma No date: COPD (chronic obstructive pulmonary disease) (HCC) No date: DVT (deep venous thrombosis) (HCC) No date: GERD (gastroesophageal reflux disease) No date: Heart murmur No date: Hemorrhoids No date: History of kidney stones No date: History of nocturia No date: Hypertension No date: Hypothyroidism No date: PAF (paroxysmal atrial fibrillation) (HCC) No date: 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  Reproductive/Obstetrics                              Anesthesia Physical Anesthesia Plan  ASA: 3  Anesthesia Plan: General   Post-op Pain Management:    Induction: Intravenous  PONV Risk Score and Plan: 4 or greater and Ondansetron , Dexamethasone  and Treatment may vary due to age or medical condition  Airway Management Planned: LMA  Additional Equipment: None  Intra-op Plan:   Post-operative Plan: Extubation in OR  Informed Consent: I have reviewed the patients  History and Physical, chart, labs and discussed the procedure including the risks, benefits and alternatives for the proposed anesthesia with the patient or authorized representative who has indicated his/her understanding and acceptance.     Dental advisory given  Plan Discussed with: CRNA  Anesthesia Plan Comments: (Discussed risks of anesthesia with patient, including PONV, sore throat, lip/dental/eye damage. Rare risks discussed as well, such as cardiorespiratory and neurological sequelae, and allergic reactions. Discussed the role of CRNA in patient's perioperative care. Patient understands. Patient informed about increased incidence of above perioperative risk due to high BMI. Patient understands.  )         Anesthesia Quick Evaluation

## 2023-10-11 NOTE — Discharge Instructions (Signed)
 1 - You may have urinary urgency (bladder spasms) and bloody urine on / off with stent in place. This is normal.  2 - Call MD or go to ER for fever >102, severe pain / nausea / vomiting not relieved by medications, or acute change in medical status

## 2023-10-11 NOTE — Anesthesia Postprocedure Evaluation (Signed)
 Anesthesia Post Note  Patient: Shannon Hammond  Procedure(s) Performed: CYSTOSCOPY/URETEROSCOPY/HOLMIUM LASER/STENT PLACEMENT (Bilateral: Pelvis) CYSTOSCOPY, WITH RETROGRADE PYELOGRAM (Bilateral: Pelvis)     Patient location during evaluation: PACU Anesthesia Type: General Level of consciousness: awake and alert Pain management: pain level controlled Vital Signs Assessment: post-procedure vital signs reviewed and stable Respiratory status: spontaneous breathing, nonlabored ventilation, respiratory function stable and patient connected to nasal cannula oxygen Cardiovascular status: blood pressure returned to baseline and stable Postop Assessment: no apparent nausea or vomiting Anesthetic complications: no   No notable events documented.  Last Vitals:  Vitals:   10/11/23 1230 10/11/23 1241  BP: (!) 94/59 (!) 130/95  Pulse: (!) 59 63  Resp: 12 16  Temp: (!) 36.4 C 36.8 C  SpO2: 98% 92%    Last Pain:  Vitals:   10/11/23 1241  TempSrc: Oral  PainSc: 0-No pain                 Rome Ade

## 2023-10-11 NOTE — Brief Op Note (Signed)
 10/11/2023  11:48 AM  PATIENT:  Dagoberto VEAR Sable  78 y.o. female  PRE-OPERATIVE DIAGNOSIS:  LARGE BILATERAL KIDNEY STONES  POST-OPERATIVE DIAGNOSIS:  LARGE BILATERAL KIDNEY STONES  PROCEDURE:  Procedure(s) with comments: CYSTOSCOPY/URETEROSCOPY/HOLMIUM LASER/STENT PLACEMENT (Bilateral) - SECOND STAGE BILATERAL URETEROSCOPY, RETROGRADE PYELOGRAM, HOLMIUM LASER, STENT CYSTOSCOPY, WITH RETROGRADE PYELOGRAM (Bilateral) - SECOND STAGE BILATERAL URETEROSCOPY, RETROGRADE PYELOGRAM, HOLMIUM LASER, STENT  SURGEON:  Surgeons and Role:    * Manny, Ricardo KATHEE Raddle., MD - Primary  PHYSICIAN ASSISTANT:   ASSISTANTS: none   ANESTHESIA:   general  EBL:  minimal   BLOOD ADMINISTERED:none  DRAINS: none   LOCAL MEDICATIONS USED:  NONE  SPECIMEN:  Source of Specimen:  residual Rt renal stone fragments  DISPOSITION OF SPECIMEN:  discard  COUNTS:  YES  TOURNIQUET:  * No tourniquets in log *  DICTATION: .Other Dictation: Dictation Number [id number]  PLAN OF CARE: Discharge to home after PACU  PATIENT DISPOSITION:  PACU - hemodynamically stable.   Delay start of Pharmacological VTE agent (>24hrs) due to surgical blood loss or risk of bleeding: no

## 2023-10-11 NOTE — Transfer of Care (Signed)
 Immediate Anesthesia Transfer of Care Note  Patient: Shannon Hammond  Procedure(s) Performed: CYSTOSCOPY/URETEROSCOPY/HOLMIUM LASER/STENT PLACEMENT (Bilateral: Pelvis) CYSTOSCOPY, WITH RETROGRADE PYELOGRAM (Bilateral: Pelvis)  Patient Location: PACU  Anesthesia Type:General  Level of Consciousness: awake, alert , and oriented  Airway & Oxygen Therapy: Patient Spontanous Breathing and Patient connected to face mask oxygen  Post-op Assessment: Report given to RN and Post -op Vital signs reviewed and stable  Post vital signs: Reviewed and stable  Last Vitals:  Vitals Value Taken Time  BP 101/63 10/11/23 12:00  Temp    Pulse 55 10/11/23 12:01  Resp 15 10/11/23 12:01  SpO2 100 % 10/11/23 12:01  Vitals shown include unfiled device data.  Last Pain:  Vitals:   10/11/23 1013  TempSrc:   PainSc: 0-No pain      Patients Stated Pain Goal: 3 (10/02/23 1242)  Complications: No notable events documented.

## 2023-10-11 NOTE — Anesthesia Procedure Notes (Signed)
 Procedure Name: LMA Insertion Date/Time: 10/11/2023 10:58 AM  Performed by: Therisa Doyal CROME, CRNAPatient Re-evaluated:Patient Re-evaluated prior to induction Oxygen Delivery Method: Circle system utilized Preoxygenation: Pre-oxygenation with 100% oxygen Induction Type: IV induction LMA: LMA inserted LMA Size: 4.0 Number of attempts: 1 Placement Confirmation: positive ETCO2 and breath sounds checked- equal and bilateral Tube secured with: Tape Dental Injury: Teeth and Oropharynx as per pre-operative assessment

## 2023-10-12 ENCOUNTER — Encounter (HOSPITAL_COMMUNITY): Payer: Self-pay | Admitting: Urology

## 2023-10-31 DIAGNOSIS — N3020 Other chronic cystitis without hematuria: Secondary | ICD-10-CM | POA: Diagnosis not present

## 2023-10-31 DIAGNOSIS — N202 Calculus of kidney with calculus of ureter: Secondary | ICD-10-CM | POA: Diagnosis not present

## 2023-11-05 ENCOUNTER — Encounter (HOSPITAL_BASED_OUTPATIENT_CLINIC_OR_DEPARTMENT_OTHER): Payer: Self-pay | Admitting: Family

## 2023-11-05 ENCOUNTER — Encounter (HOSPITAL_BASED_OUTPATIENT_CLINIC_OR_DEPARTMENT_OTHER): Payer: Self-pay

## 2023-11-05 ENCOUNTER — Ambulatory Visit (HOSPITAL_BASED_OUTPATIENT_CLINIC_OR_DEPARTMENT_OTHER): Admitting: Family

## 2023-11-05 VITALS — BP 138/54 | HR 81 | Ht 66.0 in | Wt 269.5 lb

## 2023-11-05 DIAGNOSIS — I480 Paroxysmal atrial fibrillation: Secondary | ICD-10-CM

## 2023-11-05 DIAGNOSIS — R0609 Other forms of dyspnea: Secondary | ICD-10-CM

## 2023-11-05 DIAGNOSIS — D6859 Other primary thrombophilia: Secondary | ICD-10-CM

## 2023-11-05 DIAGNOSIS — R946 Abnormal results of thyroid function studies: Secondary | ICD-10-CM | POA: Diagnosis not present

## 2023-11-05 DIAGNOSIS — Z79899 Other long term (current) drug therapy: Secondary | ICD-10-CM | POA: Diagnosis not present

## 2023-11-05 DIAGNOSIS — R7989 Other specified abnormal findings of blood chemistry: Secondary | ICD-10-CM | POA: Diagnosis not present

## 2023-11-05 MED ORDER — AMIODARONE HCL 200 MG PO TABS: 200.0000 mg | ORAL_TABLET | Freq: Every day | ORAL | 1 refills | Status: DC

## 2023-11-05 NOTE — Progress Notes (Signed)
 Cardiology Office Note   Date:  11/11/2023  ID:  Shannon Hammond, Shannon Hammond 1946-03-12, MRN [id number] PCP: Clarice Nottingham, MD  [facility] Providers Cardiologist:  Shelda Bruckner, MD     History of Present Illness Shannon Hammond is a 78 y.o. female with hx of PAF, recurrent DVT/PE, obesity, bradycardia, reported HFpEF, obesity, CKDIIIb, HTN, hypothyroidism, obesity, COPD, arthritis, aortic atherosclerosis, GERD.  Previous patient of Dr. Claudene.  Cardiology consulted during July admission due to A-fib with RVR after admission for urologic cystoscopy/ureteroscopy with lithotripsy and ureteral stent placement at [facility].  Echo 09/27/23 LVEF 50-55%, moderate LVH, trivial MR. She was started on IV amiodarone  and transition to amiodarone  200 mg daily.  She was discharged on Eliquis .  She feels she is shaky with more than usual exertion and more short of breath with exertion. She has an intermittently productive cough that feels like it is in her upper chest. Notes cough was better after hospital then worsened. Has taken some Robitussin DM, which did help some. Did not take her fluid pill until late yesterday which she notes may have worsened some of her symptoms. She has slept in recliner for many years an reports no orthopnea, PND. She reports bilateral lower extremity edema is mild and stable at her baseline. She has lymphedema pumps at home. Will take additional 20mg  Lasix  periodically but has not had to do that recently. She reports she is generally not aware of her atrial fibrillation. She has had heart rate as high as 114 bpm with activity. First upon arriving home had sensation of feeling real weak with activity but this has not recurred. Occasional right chest pain under her right armpit at random that is not associated with activity, reassurance provided atypical for angina.    ROS: Please see the history of present illness.    All other systems reviewed and are negative.    Studies Reviewed      Cardiac Studies & Procedures   ______________________________________________________________________________________________     ECHOCARDIOGRAM  ECHOCARDIOGRAM COMPLETE 09/27/2023  Narrative ECHOCARDIOGRAM REPORT    Patient Name:   Shannon Hammond [facility] Date of Exam: 09/27/2023 Medical Rec #:  [id number]      Height:       66.0 in Accession #:    [id number]     Weight:       272.5 lb Date of Birth:  Jun 01, 1945      BSA:          2.281 m Patient Age:    77 years       BP:           93/67 mmHg Patient Gender: F              HR:           128 bpm. Exam Location:  Inpatient  Procedure: 2D Echo, Cardiac Doppler and Color Doppler (Both Spectral and Color Flow Doppler were utilized during procedure).  Indications:     Arrhythmia  History:         Patient has no prior history of Echocardiogram examinations. COPD, Arrythmias:Atrial Fibrillation; Risk Factors:Hypertension.  Sonographer:     Jayson Gaskins Referring Phys:  [id number] ISAIAH GERALDS Diagnosing Phys: Bruckner Nanas MD  IMPRESSIONS   1. Technically difficult study, limited views and patient in Afib with RVR. Left ventricular ejection fraction, by estimation, is 50-55%. The left ventricle has grossly low normal function. Left ventricular endocardial border not optimally defined to evaluate regional wall motion. There  is moderate left ventricular hypertrophy. Left ventricular diastolic parameters are indeterminate. Recommend limited echo with contrast to better evaluate LV function once heart rate improved 2. Poor RV visualization but appears mild systolic dysfunction. Right ventricular systolic function is mildly reduced. The right ventricular size is normal. 3. The mitral valve is degenerative. Trivial mitral valve regurgitation. 4. The aortic valve was not well visualized. Aortic valve regurgitation is not visualized.  FINDINGS Left Ventricle: Left ventricular ejection fraction, by estimation, is  50 to 55%. The left ventricle has low normal function. Left ventricular endocardial border not optimally defined to evaluate regional wall motion. The left ventricular internal cavity size was normal in size. There is moderate left ventricular hypertrophy. Left ventricular diastolic parameters are indeterminate.  Right Ventricle: The right ventricular size is normal. Right vetricular wall thickness was not well visualized. Right ventricular systolic function is mildly reduced.  Left Atrium: Left atrial size was not well visualized.  Right Atrium: Right atrial size was not well visualized.  Pericardium: Trivial pericardial effusion is present. Presence of epicardial fat layer.  Mitral Valve: The mitral valve is degenerative in appearance. Trivial mitral valve regurgitation.  Tricuspid Valve: The tricuspid valve is normal in structure. Tricuspid valve regurgitation is trivial.  Aortic Valve: The aortic valve was not well visualized. Aortic valve regurgitation is not visualized.  Pulmonic Valve: The pulmonic valve was not well visualized. Pulmonic valve regurgitation is not visualized.  Aorta: The aortic root is normal in size and structure.  IAS/Shunts: The interatrial septum was not well visualized.   LEFT VENTRICLE PLAX 2D LVIDd:         4.60 cm LVIDs:         3.00 cm LV PW:         1.40 cm LV IVS:        1.40 cm LVOT diam:     1.80 cm LVOT Area:     2.54 cm    AORTA Ao Root diam: 2.90 cm  MITRAL VALVE               TRICUSPID VALVE MV Area (PHT): 5.79 cm    TR Peak grad:   36.2 mmHg MV Decel Time: 131 msec    TR Vmax:        301.00 cm/s MV E velocity: 86.50 cm/s SHUNTS Systemic Diam: 1.80 cm  Lonni Nanas MD Electronically signed by Lonni Nanas MD Signature Date/Time: 09/27/2023/9:37:20 AM    Final (Updated)          ______________________________________________________________________________________________      Risk  Assessment/Calculations  CHA2DS2-VASc Score = 6   This indicates a 9.7% annual risk of stroke. The patient's score is based upon: CHF History: 1 HTN History: 1 Diabetes History: 0 Stroke History: 0 Vascular Disease History: 1 (aortic atherosclerosis) Age Score: 2 Gender Score: 1         Physical Exam VS:  BP (!) 138/54   Pulse 81   Ht 5' 6 (1.676 m)   Wt 269 lb 8 oz (122.2 kg)   SpO2 96%   BMI 43.50 kg/m        Wt Readings from Last 3 Encounters:  11/05/23 269 lb 8 oz (122.2 kg)  10/11/23 268 lb 15.4 oz (122 kg)  09/27/23 272 lb 8 oz (123.6 kg)    GEN: Well nourished, well developed in no acute distress NECK: No JVD; No carotid bruits CARDIAC: RRR, no murmurs, rubs, gallops RESPIRATORY:  Clear to auscultation without rales, wheezing or rhonchi  ABDOMEN: Soft, non-tender, non-distended EXTREMITIES:  No edema; No deformity   ASSESSMENT AND PLAN   URI - Supportive care and follow up with PCP recommended.  PAF / On Amiodarone  therapy / Hypercoagulable state - Recurrent atrial fib during recent admission Plan for 7 day ZIO monitor to assess atrial fibrillation burden. If with breakthrough AFib on Amiodarone  would not discontinue. Amiodarone  monitoring: TSH, CMP today OAC monitoring: CBC today  DOE / Aortic atherosclerosis - no prior ischemic workup per her report. Due to persistent exertional dyspnea and risk factors for CAD will plan for cardiac PET.   HTN - Continue diltiaem 120mg  daily, Lasix  20mg  and 40mg  every other day. Discussed to monitor BP at home at least 2 hours after medications and sitting for 5-10 minutes.   HLD - Continue Rosuvastatin  5 mg daily. If CAD noted on PET, consider increased dose.   LE edema / HFpEF - well managed on Lasix  20mg  daily. Leg elevation encouraged.    Informed Consent   Shared Decision Making/Informed Consent The risks [chest pain, shortness of breath, cardiac arrhythmias, dizziness, blood pressure fluctuations, myocardial  infarction, stroke/transient ischemic attack, nausea, vomiting, allergic reaction, radiation exposure, metallic taste sensation and life-threatening complications (estimated to be 1 in 10,000)], benefits (risk stratification, diagnosing coronary artery disease, treatment guidance) and alternatives of a cardiac PET stress test were discussed in detail with Shannon Hammond and she agrees to proceed.        Dispo: follow up as scheduled with AFib Clinic and in 3 months with Dr. Lonni or APP  Signed, Reche GORMAN Finder, NP

## 2023-11-05 NOTE — Patient Instructions (Addendum)
 Medication Instructions:  Continue your current medications.   *If you need a refill on your cardiac medications before your next appointment, please call your pharmacy*  Lab Work: Your physician recommends that you return for lab work today: TSH, CMP, CBC  If you have labs (blood work) drawn today and your tests are completely normal, you will receive your results only by: MyChart Message (if you have MyChart) OR A paper copy in the mail If you have any lab test that is abnormal or we need to change your treatment, we will call you to review the results.  Testing/Procedures: Your provider has recommended a cardiac PET. See instructions below.   Your physician has recommended that you wear a Zio monitor.   This monitor is a medical device that records the heart's electrical activity. Doctors most often use these monitors to diagnose arrhythmias. Arrhythmias are problems with the speed or rhythm of the heartbeat. The monitor is a small device applied to your chest. You can wear one while you do your normal daily activities. While wearing this monitor if you have any symptoms to push the button and record what you felt. Once you have worn this monitor for the period of time provider prescribed (Usually 14 days), you will return the monitor device in the postage paid box. Once it is returned they will download the data collected and provide us  with a report which the provider will then review and we will call you with those results. Important tips:  Avoid showering during the first 24 hours of wearing the monitor. Avoid excessive sweating to help maximize wear time. Do not submerge the device, no hot tubs, and no swimming pools. Keep any lotions or oils away from the patch. After 24 hours you may shower with the patch on. Take brief showers with your back facing the shower head.  Do not remove patch once it has been placed because that will interrupt data and decrease adhesive wear time. Push  the button when you have any symptoms and write down what you were feeling. Once you have completed wearing your monitor, remove and place into box which has postage paid and place in your outgoing mailbox.  If for some reason you have misplaced your box then call our office and we can provide another box and/or mail it off for you.  Follow-Up: At [facility], you and your health needs are our priority.  As part of our continuing mission to provide you with exceptional heart care, our providers are all part of one team.  This team includes your primary Cardiologist (physician) and Advanced Practice Providers or APPs (Physician Assistants and Nurse Practitioners) who all work together to provide you with the care you need, when you need it.  Your next appointment:   As scheduled with AFib Clinic AND In 3 months with Dr. Lonni or Reche GORMAN Finder, NP   We recommend signing up for the patient portal called MyChart.  Sign up information is provided on this After Visit Summary.  MyChart is used to connect with patients for Virtual Visits (Telemedicine).  Patients are able to view lab/test results, encounter notes, upcoming appointments, etc.  Non-urgent messages can be sent to your provider as well.   To learn more about what you can do with MyChart, go to [email or web address].   Other Instructions     Please report to Radiology at the [facility] Main Entrance 30 minutes early for your test.  [address] [place]  [place], [place] [address]                         OR   Please report to Radiology at [facility] Main Entrance, medical mall, 30 mins prior to your test.  [address]  [place], [place]  How to Prepare for Your Cardiac PET/CT Stress Test:  Nothing to eat or drink, except water, 3 hours prior to arrival time.  NO caffeine/decaffeinated products, or chocolate 12 hours prior to arrival. (Please note decaffeinated beverages  (teas/coffees) still contain caffeine).  If you have caffeine within 12 hours prior, the test will need to be rescheduled.  Medication instructions: Do not take erectile dysfunction medications for 72 hours prior to test (sildenafil, tadalafil) Do not take nitrates (isosorbide mononitrate, Ranexa) the day before or day of test Do not take tamsulosin the day before or morning of test Hold theophylline containing medications for 12 hours. Hold Dipyridamole 48 hours prior to the test.  Diabetic Preparation: If able to eat breakfast prior to 3 hour fasting, you may take all medications, including your insulin. Do not worry if you miss your breakfast dose of insulin - start at your next meal. If you do not eat prior to 3 hour fast-Hold all diabetes (oral and insulin) medications. Patients who wear a continuous glucose monitor MUST remove the device prior to scanning.  You may take your remaining medications with water.  NO perfume, cologne or lotion on chest or abdomen area. FEMALES - Please avoid wearing dresses to this appointment.  Total time is 1 to 2 hours; you may want to bring reading material for the waiting time.  IF YOU THINK YOU MAY BE PREGNANT, OR ARE NURSING PLEASE INFORM THE TECHNOLOGIST.  In preparation for your appointment, medication and supplies will be purchased.  Appointment availability is limited, so if you need to cancel or reschedule, please call the Radiology Department Scheduler at [phone] 24 hours in advance to avoid a cancellation fee of $100.00  What to Expect When you Arrive:  Once you arrive and check in for your appointment, you will be taken to a preparation room within the Radiology Department.  A technologist or Nurse will obtain your medical history, verify that you are correctly prepped for the exam, and explain the procedure.  Afterwards, an IV will be started in your arm and electrodes will be placed on your skin for EKG monitoring during the stress  portion of the exam. Then you will be escorted to the PET/CT scanner.  There, staff will get you positioned on the scanner and obtain a blood pressure and EKG.  During the exam, you will continue to be connected to the EKG and blood pressure machines.  A small, safe amount of a radioactive tracer will be injected in your IV to obtain a series of pictures of your heart along with an injection of a stress agent.    After your Exam:  It is recommended that you eat a meal and drink a caffeinated beverage to counter act any effects of the stress agent.  Drink plenty of fluids for the remainder of the day and urinate frequently for the first couple of hours after the exam.  Your doctor will inform you of your test results within 7-10 business days.  For more information and frequently asked questions, please visit our website: [email or web address]  For questions about your test or how to prepare for your test, please call: Cardiac  Imaging Nurse Navigators Office: [phone]

## 2023-11-06 ENCOUNTER — Ambulatory Visit (HOSPITAL_BASED_OUTPATIENT_CLINIC_OR_DEPARTMENT_OTHER): Payer: Self-pay | Admitting: Family

## 2023-11-06 LAB — COMPREHENSIVE METABOLIC PANEL WITH GFR
ALT: 10 IU/L (ref 0–32)
AST: 10 IU/L (ref 0–40)
Albumin: 4 g/dL (ref 3.8–4.8)
Alkaline Phosphatase: 134 IU/L — ABNORMAL HIGH (ref 44–121)
BUN/Creatinine Ratio: 11 — ABNORMAL LOW (ref 12–28)
BUN: 19 mg/dL (ref 8–27)
Bilirubin Total: <0.2 mg/dL (ref 0.0–1.2)
CO2: 20 mmol/L (ref 20–29)
Calcium: 9.5 mg/dL (ref 8.7–10.3)
Chloride: 103 mmol/L (ref 96–106)
Creatinine, Ser: 1.77 mg/dL — ABNORMAL HIGH (ref 0.57–1.00)
Globulin, Total: 2.7 g/dL (ref 1.5–4.5)
Glucose: 84 mg/dL (ref 70–99)
Potassium: 4.5 mmol/L (ref 3.5–5.2)
Sodium: 141 mmol/L (ref 134–144)
Total Protein: 6.7 g/dL (ref 6.0–8.5)
eGFR: 29 mL/min/1.73 — ABNORMAL LOW (ref >59)

## 2023-11-06 LAB — CBC
Hematocrit: 36.5 % (ref 34.0–46.6)
Hemoglobin: 11.7 g/dL (ref 11.1–15.9)
MCH: 29 pg (ref 26.6–33.0)
MCHC: 32.1 g/dL (ref 31.5–35.7)
MCV: 90 fL (ref 79–97)
Platelets: 342 x10E3/uL (ref 150–450)
RBC: 4.04 x10E6/uL (ref 3.77–5.28)
RDW: 14.8 % (ref 11.7–15.4)
WBC: 8.8 x10E3/uL (ref 3.4–10.8)

## 2023-11-06 LAB — TSH: TSH: 5.12 u[IU]/mL — ABNORMAL HIGH (ref 0.450–4.500)

## 2023-11-06 NOTE — Telephone Encounter (Signed)
 11/06/2023 - Results: Interface, [facility] Lab Results In and Vannie Reche RAMAN, NP (Newest Message First)           Vannie Reche RAMAN, NP to Cv Div Dwb Triage (Selected Message)    11/06/23 10:13 AM Result Note TSH elevated, please request add on T3/T4 from [facility].    Kidney function overall stable. Normal ALT/AST (liver enzymes). Normal electrolytes. Alkaline phosphatase mildly elevated, recommend limiting fried foods. CBC with no evidence of anemia nor infection.     Called the main lab at [facility] to request add on Free T3/T4 to existing specimen.  They will add this on and fax over the authorization form for Reche Vannie NP to sign and fax back thereafter.

## 2023-11-07 LAB — T4, FREE: Free T4: 1.08 ng/dL (ref 0.82–1.77)

## 2023-11-07 LAB — T3, FREE: T3, Free: 2.4 pg/mL (ref 2.0–4.4)

## 2023-11-07 LAB — SPECIMEN STATUS REPORT

## 2023-11-11 ENCOUNTER — Encounter (HOSPITAL_BASED_OUTPATIENT_CLINIC_OR_DEPARTMENT_OTHER): Payer: Self-pay | Admitting: Family

## 2023-11-11 DIAGNOSIS — R935 Abnormal findings on diagnostic imaging of other abdominal regions, including retroperitoneum: Secondary | ICD-10-CM | POA: Diagnosis not present

## 2023-11-11 DIAGNOSIS — N949 Unspecified condition associated with female genital organs and menstrual cycle: Secondary | ICD-10-CM | POA: Diagnosis not present

## 2023-11-15 ENCOUNTER — Ambulatory Visit: Attending: Family

## 2023-11-15 DIAGNOSIS — I480 Paroxysmal atrial fibrillation: Secondary | ICD-10-CM

## 2023-11-15 NOTE — Progress Notes (Unsigned)
 Enrolled for [employer] to mail a ZIO XT long term holter monitor to the patients address on file.   Dr. Jodelle Red to read.

## 2023-11-19 DIAGNOSIS — H10413 Chronic giant papillary conjunctivitis, bilateral: Secondary | ICD-10-CM | POA: Diagnosis not present

## 2023-11-19 DIAGNOSIS — H04123 Dry eye syndrome of bilateral lacrimal glands: Secondary | ICD-10-CM | POA: Diagnosis not present

## 2023-11-19 DIAGNOSIS — H2513 Age-related nuclear cataract, bilateral: Secondary | ICD-10-CM | POA: Diagnosis not present

## 2023-12-02 ENCOUNTER — Ambulatory Visit (HOSPITAL_COMMUNITY)
Admit: 2023-12-02 | Discharge: 2023-12-02 | Disposition: A | Discharge status: Home / Self Care | Attending: Internal Medicine | Admitting: Internal Medicine

## 2023-12-02 ENCOUNTER — Encounter (HOSPITAL_COMMUNITY): Payer: Self-pay | Admitting: Internal Medicine

## 2023-12-02 VITALS — BP 142/60 | HR 75 | Ht 66.0 in | Wt 269.2 lb

## 2023-12-02 DIAGNOSIS — I480 Paroxysmal atrial fibrillation: Secondary | ICD-10-CM | POA: Diagnosis not present

## 2023-12-02 DIAGNOSIS — D6869 Other thrombophilia: Secondary | ICD-10-CM | POA: Diagnosis not present

## 2023-12-02 DIAGNOSIS — Z5181 Encounter for therapeutic drug level monitoring: Secondary | ICD-10-CM | POA: Diagnosis not present

## 2023-12-02 DIAGNOSIS — Z79899 Other long term (current) drug therapy: Secondary | ICD-10-CM

## 2023-12-02 NOTE — Progress Notes (Signed)
 Primary Care Physician: Clarice Nottingham, MD Primary Cardiologist: Shelda Bruckner, MD Electrophysiologist: None     Referring Physician: Dr. Jeffrie Dagoberto VEAR Shannon Hammond is a 78 y.o. female with a history of recurrent DVT/PE, obesity, bradycardia, obesity, HTN, chronic diastolic CHF, CKD, hypothyroidism, COPD, aortic atherosclerosis, GERD, and paroxysmal atrial fibrillation who presents for consultation in the [facility]. Hospital admission July 2025 for renal calculus also treated for Afib with RVR; discharged on amiodarone  200 mg daily. Seen by Cardiology on 8/19 and wearing cardiac monitor to determine Afib burden. Currently taking amiodarone  200 mg daily. Patient is on Eliquis  5 mg BID for a CHADS2VASC score of 6.  On evaluation today, patient is currently in NSR. She is taking amiodarone  200 mg daily. No missed doses of Eliquis . She has noted two episodes of right-sided chest pain since hospital discharge. She notes the right-sided chest pain originates under her right breast and travels upwards; drinking a carbonated beverage to belch has historically provided relief. She notes intermittent SOB but notes this is not new. She mailed monitor a few days ago.  Today, she denies symptoms of palpitations, orthopnea, PND, lower extremity edema, dizziness, presyncope, syncope, snoring, daytime somnolence, bleeding, or neurologic sequela. The patient is tolerating medications without difficulties and is otherwise without complaint today.    she has a BMI of Body mass index is 43.45 kg/m.SABRA Filed Weights   12/02/23 0942  Weight: 122.1 kg    Current Outpatient Medications  Medication Sig Dispense Refill   acetaminophen  (TYLENOL ) 325 MG tablet Take 650 mg by mouth every 6 (six) hours as needed for moderate pain (pain score 4-6).     amiodarone  (PACERONE ) 200 MG tablet Take 1 tablet (200 mg total) by mouth daily. 30 tablet 1   apixaban  (ELIQUIS ) 5 MG TABS tablet  Take 1 tablet (5 mg total) by mouth 2 (two) times daily.     B Complex-C (B-COMPLEX WITH VITAMIN C) tablet Take 1 tablet by mouth daily.     budesonide-formoterol  (SYMBICORT) 160-4.5 MCG/ACT inhaler Inhale 2 puffs into the lungs 2 (two) times daily.     Cholecalciferol (VITAMIN D3) 50 MCG (2000 UT) TABS Take 2,000 Units by mouth daily.     diltiazem  (CARDIZEM  SR) 120 MG 12 hr capsule Take 1 capsule (120 mg total) by mouth 2 (two) times daily. 60 capsule 0   furosemide  (LASIX ) 20 MG tablet Take 1 tablet (20 mg total) by mouth daily as needed for edema or fluid. 30 tablet    furosemide  (LASIX ) 40 MG tablet Take 20 mg by mouth daily.     gabapentin  (NEURONTIN ) 300 MG capsule Take 300 mg by mouth at bedtime.     levothyroxine  (SYNTHROID , LEVOTHROID) 88 MCG tablet Take 88 mcg by mouth daily before breakfast.     magnesium  oxide (MAG-OX) 400 MG tablet Take 400 mg by mouth daily. (Patient taking differently: Take 400 mg by mouth. Taking Mon, Wed, Fri)     omeprazole (PRILOSEC) 40 MG capsule Take 40 mg by mouth in the morning and at bedtime.     ondansetron  (ZOFRAN  ODT) 4 MG disintegrating tablet Take 1 tablet (4 mg total) by mouth every 8 (eight) hours as needed for nausea or vomiting. 24 tablet 0   oxybutynin (DITROPAN-XL) 10 MG 24 hr tablet Take 10 mg by mouth daily.     rosuvastatin  (CRESTOR ) 5 MG tablet Take 5 mg by mouth every other day.     sulfamethoxazole-trimethoprim (  BACTRIM DS) 800-160 MG tablet Take 1 tablet by mouth 2 (two) times daily.     traMADol  (ULTRAM ) 50 MG tablet Take 1 tablet (50 mg total) by mouth every 6 (six) hours as needed for moderate pain (pain score 4-6) or severe pain (pain score 7-10) (post-operatively). 15 tablet 0   VENTOLIN  HFA 108 (90 BASE) MCG/ACT inhaler Inhale 2 puffs into the lungs every 4 (four) hours as needed for shortness of breath.      No current facility-administered medications for this encounter.    Atrial Fibrillation Management history:  Previous  antiarrhythmic drugs: amiodarone  Previous cardioversions: none Previous ablations: none Anticoagulation history: Eliquis    ROS- All systems are reviewed and negative except as per the HPI above.  Physical Exam: BP (!) 142/60   Pulse 75   Ht 5' 6 (1.676 m)   Wt 122.1 kg   BMI 43.45 kg/m   GEN: Well nourished, well developed in no acute distress NECK: No JVD; No carotid bruits CARDIAC: Regular rate and rhythm, no murmurs, rubs, gallops RESPIRATORY:  Clear to auscultation without rales, wheezing or rhonchi  ABDOMEN: Soft, non-tender, non-distended EXTREMITIES:  No edema; No deformity   EKG today demonstrates  Vent. rate 75 BPM PR interval 154 ms QRS duration 94 ms QT/QTcB 416/464 ms P-R-T axes 21 -44 -2 Normal sinus rhythm Left axis deviation Low voltage QRS Possible Anterolateral infarct (cited on or before 23-Sep-2023) Abnormal ECG When compared with ECG of 26-Sep-2023 08:13, Sinus rhythm has replaced Atrial fibrillation  Echo 09/27/23 demonstrated  1. Technically difficult study, limited views and patient in Afib with  RVR. Left ventricular ejection fraction, by estimation, is 50-55%. The  left ventricle has grossly low normal function. Left ventricular  endocardial border not optimally defined to  evaluate regional wall motion. There is moderate left ventricular  hypertrophy. Left ventricular diastolic parameters are indeterminate.  Recommend limited echo with contrast to better evaluate LV function once  heart rate improved   2. Poor RV visualization but appears mild systolic dysfunction. Right  ventricular systolic function is mildly reduced. The right ventricular  size is normal.   3. The mitral valve is degenerative. Trivial mitral valve regurgitation.   4. The aortic valve was not well visualized. Aortic valve regurgitation  is not visualized.   ASSESSMENT & PLAN CHA2DS2-VASc Score = 6  The patient's score is based upon: CHF History: 1 HTN History:  1 Diabetes History: 0 Stroke History: 0 Vascular Disease History: 1 (aortic atherosclerosis) Age Score: 2 Gender Score: 1       ASSESSMENT AND PLAN: Paroxysmal Atrial Fibrillation (ICD10:  I48.0) The patient's CHA2DS2-VASc score is 6, indicating a 9.7% annual risk of stroke.    She is currently in NSR. Will continue with current management and await monitor results. If she has elevated Afib burden despite being on amiodarone , will confer with EP for plan of care. For now, continue on amiodarone .  High risk medication monitoring (ICD10: Z79.899) Patient requires ongoing monitoring for anti-arrhythmic medication which has the potential to cause life threatening arrhythmias or AV block. Qtc stable. Continue amiodarone  200 mg daily.   Secondary Hypercoagulable State (ICD10:  D68.69) The patient is at significant risk for stroke/thromboembolism based upon her CHA2DS2-VASc Score of 6.  Continue Apixaban  (Eliquis ).  Continue Eliquis  5 mg BID.    Follow up 6 months Afib clinic.     [facility]  Afib Clinic [address] [place], [place] [address] [phone]

## 2023-12-03 ENCOUNTER — Encounter: Payer: Self-pay | Admitting: Pulmonary Disease

## 2023-12-03 ENCOUNTER — Ambulatory Visit: Admitting: Pulmonary Disease

## 2023-12-03 VITALS — BP 152/74 | HR 62 | Temp 98.2°F | Ht 66.0 in | Wt 271.0 lb

## 2023-12-03 DIAGNOSIS — J4540 Moderate persistent asthma, uncomplicated: Secondary | ICD-10-CM

## 2023-12-03 DIAGNOSIS — R0609 Other forms of dyspnea: Secondary | ICD-10-CM

## 2023-12-03 MED ORDER — BREZTRI AEROSPHERE 160-9-4.8 MCG/ACT IN AERO: 2.0000 | INHALATION_SPRAY | Freq: Two times a day (BID) | RESPIRATORY_TRACT | Status: DC

## 2023-12-03 NOTE — Patient Instructions (Signed)
 It is nice to meet you  Your chest x-rays have been clear  Your pulmonary function test in the past were consistent with asthma  If the lungs are contributing that it could be that we need better asthma treatment.  There is always a chance the lungs are not contributing and that something else that we cannot fix from a lung standpoint.  To try the strongest inhaler we can, use Breztri  2 puffs in the morning and 2 puffs in the evening.  Rinse your mouth out thoroughly with water after every use.  Send me a message or call me in the next couple of weeks let me know if you think this is helping your breathing.  If not we can return to the Symbicort or try a different inhaler called Trelegy.  Return to clinic in 3 months or sooner as needed with Dr. Annella

## 2023-12-03 NOTE — Progress Notes (Signed)
 @Patient  ID: Shannon Hammond, female    DOB: 02/12/1946, 78 y.o.   MRN: [id number]  Chief Complaint  Patient presents with   Hospitalization Follow-up    Afib and low oxygen.    Shortness of Breath    When walking. After history of blood clot , 10 years ago.    Referring provider: Clarice Nottingham, MD  HPI:   78 y.o. woman with history of atrial fibrillation, asthma, and PE on chronic anticoagulation who were seen for evaluation of dyspnea on exertion.  Multiple prior pulmonary notes reviewed.  Multiple cardiology notes reviewed.  Discharge summary 09/2023 reviewed.  Admitted to the hospital over the summer with shortness of breath found to be in A-fib.  Evidently oxygen levels were low.  A-fib was corrected.  Heart rates improved.  No longer hypoxemia.  Discharged home with no oxygen.  She is been short of breath before this.  Dyspnea exertion notably.  Okay at rest.  Present on longer walks, inclines, stairs.  No time of day when things are better or worse.  No position make things better or worse.  No seasonal or environmental factors she can identify to make things better or worse.  She was prescribed Symbicort some years ago in pulmonary clinic.  This was after PFTs 2014 on my review revealed no fixed obstruction but a 14% increase in FEV1 after bronchodilators.  She thinks it helped some.  Not a lot.  Albuterol  helps a little bit.  Recent labs without anemia.  Recent cardiology visit in sinus.  She is in sinus today.  Result of ambulatory cardiac monitor is pending.  She had a TTE 09/2023 that had very poor windows really hard to interpret, demonstrated MVR, RV dysfunction, grossly normal EF, could not comment further on other valves diastolic dysfunction etc.  She notes she is mainly limited due to lower extremity pain in the setting of lymphedema.  She is much less active over time and gradually less active due to more to joint issues.  But shortness of breath also contributes.  Most recent  chest x-ray 09/2023 clear lungs atelectasis of the right hemidiaphragm elevation.  07/2023 cross-sectional imaging clear noted to have pectus.    Questionaires / Pulmonary Flowsheets:   ACT:      No data to display          MMRC:     No data to display          Epworth:      No data to display          Tests:   FENO:  No results found for: NITRICOXIDE  PFT:     No data to display          WALK:     11/03/2014    4:05 PM  SIX MIN WALK  Supplimental Oxygen during Test? (L/min) No  Tech Comments: Pt able to complete 2 laps around office at a slow pace. After 2nd lap pt stated increased SOB and requested to go back to his room     Imaging: Personally reviewed and as per EMR and discussed this note No results found.  Lab Results: Personally reviewed CBC    Component Value Date/Time   WBC 8.8 11/05/2023 1159   WBC 13.3 (H) 09/30/2023 0413   RBC 4.04 11/05/2023 1159   RBC 4.49 09/30/2023 0413   HGB 11.7 11/05/2023 1159   HCT 36.5 11/05/2023 1159   PLT 342 11/05/2023 1159   MCV 90  11/05/2023 1159   MCH 29.0 11/05/2023 1159   MCH 29.0 09/30/2023 0413   MCHC 32.1 11/05/2023 1159   MCHC 33.2 09/30/2023 0413   RDW 14.8 11/05/2023 1159   LYMPHSABS 2.6 09/30/2023 0413   MONOABS 1.4 (H) 09/30/2023 0413   EOSABS 0.3 09/30/2023 0413   BASOSABS 0.1 09/30/2023 0413    BMET    Component Value Date/Time   NA 141 11/05/2023 1159   K 4.5 11/05/2023 1159   CL 103 11/05/2023 1159   CO2 20 11/05/2023 1159   GLUCOSE 84 11/05/2023 1159   GLUCOSE 94 09/30/2023 0413   BUN 19 11/05/2023 1159   CREATININE 1.77 (H) 11/05/2023 1159   CALCIUM  9.5 11/05/2023 1159   GFRNONAA 31 (L) 09/30/2023 0413   GFRAA 31 (L) 03/02/2016 0915    BNP    Component Value Date/Time   BNP 595.1 (H) 09/27/2023 0621    ProBNP    Component Value Date/Time   PROBNP 107.0 (H) 11/02/2014 1707    Specialty Problems       Pulmonary Problems   Dyspnea   Followed in  Pulmonary clinic/ [facility]/ [facility]  - PFT's 04/09/12 FEV1 1.87 (74 % ) ratio 86  p 14 % improvement from saba with DLCO  66 % corrects to 86 % for alv volume   - reported better on symbicort but hfa < 25% 11/02/2014  - 11/02/2014  Walked RA x 2 laps @ 185 ft each stopped due to End of study slow pace, no desat  / no ekg changes but sob/ no cp or neck pain - D dimer indeterminate range 11/02/2014 >  VQ and venous dopplers  Neg 11/10/2014        Acute respiratory failure with hypoxia (HCC)   COPD/asthma    Allergies  Allergen Reactions   Mirabegron Itching and Rash    Other Reaction(s): itching, Unknown    Immunization History  Administered Date(s) Administered   Influenza-Unspecified 12/17/2012, 11/17/2017    Past Medical History:  Diagnosis Date   Arthritis    BACK AND KNEES   Asthma    COPD (chronic obstructive pulmonary disease) (HCC)    DVT (deep venous thrombosis) (HCC)    GERD (gastroesophageal reflux disease)    Heart murmur    Hemorrhoids    History of kidney stones    History of nocturia    Hypertension    Hypothyroidism    PAF (paroxysmal atrial fibrillation) (HCC)    Recurrent pulmonary emboli (HCC)    Seizures (HCC) 03/19/2005   ONLY ONCE - THOUGHT TO BE STRESS INDUCED - PT WAS DEALING WITH HER MOTHER'S DEATH   Shortness of breath    PT TOLD BY HER MEDICAL DOCTOR - SOME LUNG CHANGES DUE TO 2ND HAND SMOKE - SHE WAS GIVEN INHALER TO USE AS NEEDED.    Tobacco History: Social History   Tobacco Use  Smoking Status Never  Smokeless Tobacco Never  Tobacco Comments   Never smoked 12/02/23   Counseling given: Not Answered Tobacco comments: Never smoked 12/02/23   Continue to not smoke  Outpatient Encounter Medications as of 12/03/2023  Medication Sig   acetaminophen  (TYLENOL ) 325 MG tablet Take 650 mg by mouth every 6 (six) hours as needed for moderate pain (pain score 4-6).   amiodarone  (PACERONE ) 200 MG tablet Take 1 tablet (200 mg total) by mouth  daily.   apixaban  (ELIQUIS ) 5 MG TABS tablet Take 1 tablet (5 mg total) by mouth 2 (two) times daily.  B Complex-C (B-COMPLEX WITH VITAMIN C) tablet Take 1 tablet by mouth daily.   budesonide-formoterol  (SYMBICORT) 160-4.5 MCG/ACT inhaler Inhale 2 puffs into the lungs 2 (two) times daily.   Cholecalciferol (VITAMIN D3) 50 MCG (2000 UT) TABS Take 2,000 Units by mouth daily.   diltiazem  (CARDIZEM  SR) 120 MG 12 hr capsule Take 1 capsule (120 mg total) by mouth 2 (two) times daily.   furosemide  (LASIX ) 20 MG tablet Take 1 tablet (20 mg total) by mouth daily as needed for edema or fluid.   furosemide  (LASIX ) 40 MG tablet Take 20 mg by mouth daily.   gabapentin  (NEURONTIN ) 300 MG capsule Take 300 mg by mouth at bedtime.   levothyroxine  (SYNTHROID , LEVOTHROID) 88 MCG tablet Take 88 mcg by mouth daily before breakfast.   magnesium  oxide (MAG-OX) 400 MG tablet Take 400 mg by mouth daily. (Patient taking differently: Take 400 mg by mouth. Taking Mon, Wed, Fri)   omeprazole (PRILOSEC) 40 MG capsule Take 40 mg by mouth in the morning and at bedtime.   ondansetron  (ZOFRAN  ODT) 4 MG disintegrating tablet Take 1 tablet (4 mg total) by mouth every 8 (eight) hours as needed for nausea or vomiting.   oxybutynin (DITROPAN-XL) 10 MG 24 hr tablet Take 10 mg by mouth daily.   rosuvastatin  (CRESTOR ) 5 MG tablet Take 5 mg by mouth every other day.   sulfamethoxazole-trimethoprim (BACTRIM DS) 800-160 MG tablet Take 1 tablet by mouth 2 (two) times daily.   traMADol  (ULTRAM ) 50 MG tablet Take 1 tablet (50 mg total) by mouth every 6 (six) hours as needed for moderate pain (pain score 4-6) or severe pain (pain score 7-10) (post-operatively).   VENTOLIN  HFA 108 (90 BASE) MCG/ACT inhaler Inhale 2 puffs into the lungs every 4 (four) hours as needed for shortness of breath.    No facility-administered encounter medications on file as of 12/03/2023.     Review of Systems  Review of Systems  No chest pain with exertion.  No  orthopnea or PND.  Comprehensive review of systems otherwise negative. Physical Exam  BP (!) 152/74 (BP Location: Left Arm, Patient Position: Sitting, Cuff Size: Large)   Pulse 62   Temp 98.2 F (36.8 C) (Oral)   Ht 5' 6 (1.676 m)   Wt 271 lb (122.9 kg)   SpO2 97%   BMI 43.74 kg/m   Wt Readings from Last 5 Encounters:  12/03/23 271 lb (122.9 kg)  12/02/23 269 lb 3.2 oz (122.1 kg)  11/05/23 269 lb 8 oz (122.2 kg)  10/11/23 268 lb 15.4 oz (122 kg)  09/27/23 272 lb 8 oz (123.6 kg)    BMI Readings from Last 5 Encounters:  12/03/23 43.74 kg/m  12/02/23 43.45 kg/m  11/05/23 43.50 kg/m  10/11/23 43.41 kg/m  09/27/23 43.98 kg/m     Physical Exam General: Sitting up in chair, no acute distress Eyes: EOMI, no icterus Neck: Supple, no JVP appreciated Pulmonary: Clear, normal work of breathing Cardiovascular: Regular rate and rhythm, no murmur Abdomen: Nondistended MSK: No synovitis, no joint effusion Neuro: Normal gait, no weakness Psych: Normal mood, full affect  Assessment & Plan:   Dyspnea on exertion: Suspect large related to deconditioning with joint issues lymphedema, decreased mobility etc. as well as habitus of obesity.  PFTs in the past were consistent with asthma.  Will escalate regimen below.  See if this helps.  Recent chest x-ray is clear.  Cross-sectional imaging limited views of the lungs on CT abdomen pelvis showed no significant parenchymal  finding, suspected basilar atelectasis noted.  Quite possible just cardiac contributors as well.  Her TTE 09/2023 had very poor windows but did demonstrate MVR and RV dysfunction.  Cannot, now much more due to poor windows.  This is likely contributing as well.  Atrial fibrillation likely contributing although this seems like better controlled.  She is in sinus rhythm today.  Additionally there could be some restrictive physiology with her pectus noted on serial images.  Moderate persistent asthma/chronic bronchitis: Daily  productive cough now worse with amiodarone .  Could be related to amiodarone .  Escalate Symbicort to Breztri .  Assess response in terms of dyspnea and cough control as well.  Consider Trelegy in the future if no better.  Again, high suspicion that there is a nonpulmonary contributor to at least her dyspnea on exertion.  Will assess.   Return in about 3 months (around 03/03/2024) for f/u Dr. Annella.   Shannon JONELLE Annella, MD 12/03/2023

## 2023-12-04 DIAGNOSIS — R310 Gross hematuria: Secondary | ICD-10-CM | POA: Diagnosis not present

## 2023-12-04 DIAGNOSIS — N202 Calculus of kidney with calculus of ureter: Secondary | ICD-10-CM | POA: Diagnosis not present

## 2023-12-04 DIAGNOSIS — N3946 Mixed incontinence: Secondary | ICD-10-CM | POA: Diagnosis not present

## 2023-12-04 DIAGNOSIS — N3020 Other chronic cystitis without hematuria: Secondary | ICD-10-CM | POA: Diagnosis not present

## 2023-12-05 DIAGNOSIS — I480 Paroxysmal atrial fibrillation: Secondary | ICD-10-CM | POA: Diagnosis not present

## 2023-12-11 DIAGNOSIS — H524 Presbyopia: Secondary | ICD-10-CM | POA: Diagnosis not present

## 2023-12-13 ENCOUNTER — Encounter (HOSPITAL_COMMUNITY): Payer: Self-pay

## 2023-12-16 ENCOUNTER — Telehealth (HOSPITAL_COMMUNITY): Payer: Self-pay | Admitting: *Deleted

## 2023-12-16 NOTE — Telephone Encounter (Signed)
Attempted to call patient regarding upcoming cardiac PET appointment. Left message on voicemail with name and callback number  Larey Brick RN Navigator Cardiac Imaging [facility] [phone] Office [phone] Cell  Reminder to avoid caffeine 12 hours prior to her cardiac PET study.

## 2023-12-16 NOTE — Telephone Encounter (Signed)
 Patient returning call about her upcoming cardiac imaging study; pt verbalizes understanding of appt date/time, parking situation and where to check in, pre-test NPO; name and call back number provided for further questions should they arise  Chantal Requena RN Navigator Cardiac Imaging [facility] [phone] office [phone] cell  Patient aware to avoid caffeine 12 hours prior to her cardiac PET study.

## 2023-12-17 ENCOUNTER — Ambulatory Visit (HOSPITAL_COMMUNITY)
Admission: RE | Admit: 2023-12-17 | Discharge: 2023-12-17 | Disposition: A | Discharge status: Home / Self Care | Source: Ambulatory Visit | Attending: Family | Admitting: Family

## 2023-12-17 DIAGNOSIS — R0609 Other forms of dyspnea: Secondary | ICD-10-CM | POA: Diagnosis not present

## 2023-12-17 LAB — NM PET CT CARDIAC PERFUSION MULTI W/ABSOLUTE BLOODFLOW
LV dias vol: 99 mL (ref 46–106)
LV sys vol: 46 mL (ref 3.8–5.2)
MBFR: 1.77
Nuc Rest EF: 54 %
Nuc Stress EF: 56 %
Rest MBF: 1.1 ml/g/min
Rest Nuclear Isotope Dose: 30 mCi
ST Depression (mm): 0 mm
Stress MBF: 1.95 ml/g/min
Stress Nuclear Isotope Dose: 30 mCi

## 2023-12-17 MED ORDER — REGADENOSON 0.4 MG/5ML IV SOLN
0.4000 mg | Freq: Once | INTRAVENOUS | Status: AC
  Administered 2023-12-17: 0.4 mg via INTRAVENOUS

## 2023-12-17 MED ORDER — RUBIDIUM RB82 GENERATOR (RUBYFILL)
29.9500 | PACK | Freq: Once | INTRAVENOUS | Status: AC
Start: 2023-12-17 — End: 2023-12-17
  Administered 2023-12-17: 29.95 via INTRAVENOUS

## 2023-12-17 MED ORDER — RUBIDIUM RB82 GENERATOR (RUBYFILL)
25.0000 | PACK | Freq: Once | INTRAVENOUS | Status: AC
  Administered 2023-12-17: 29.95 via INTRAVENOUS

## 2023-12-17 MED ORDER — REGADENOSON 0.4 MG/5ML IV SOLN
INTRAVENOUS | Status: AC
  Filled 2023-12-17: qty 5

## 2023-12-17 NOTE — Progress Notes (Signed)
 Pt tolerated lexiscan . Only symptom endorsed was butterflies in the chest per pt. Resolved by end of scan. Given PO caffeine and all questions answered by this RN, understanding verbalized.

## 2023-12-18 DIAGNOSIS — N83202 Unspecified ovarian cyst, left side: Secondary | ICD-10-CM | POA: Diagnosis not present

## 2023-12-18 DIAGNOSIS — R9389 Abnormal findings on diagnostic imaging of other specified body structures: Secondary | ICD-10-CM | POA: Diagnosis not present

## 2023-12-18 DIAGNOSIS — N83201 Unspecified ovarian cyst, right side: Secondary | ICD-10-CM | POA: Diagnosis not present

## 2023-12-18 DIAGNOSIS — N949 Unspecified condition associated with female genital organs and menstrual cycle: Secondary | ICD-10-CM | POA: Diagnosis not present

## 2023-12-25 DIAGNOSIS — I480 Paroxysmal atrial fibrillation: Secondary | ICD-10-CM

## 2024-01-20 ENCOUNTER — Encounter: Payer: Self-pay | Admitting: Radiology

## 2024-02-03 ENCOUNTER — Ambulatory Visit (HOSPITAL_BASED_OUTPATIENT_CLINIC_OR_DEPARTMENT_OTHER): Admitting: Cardiology

## 2024-02-04 DIAGNOSIS — I2782 Chronic pulmonary embolism: Secondary | ICD-10-CM | POA: Diagnosis not present

## 2024-02-04 DIAGNOSIS — I130 Hypertensive heart and chronic kidney disease with heart failure and stage 1 through stage 4 chronic kidney disease, or unspecified chronic kidney disease: Secondary | ICD-10-CM | POA: Diagnosis not present

## 2024-02-04 DIAGNOSIS — N3941 Urge incontinence: Secondary | ICD-10-CM | POA: Diagnosis not present

## 2024-02-04 DIAGNOSIS — N189 Chronic kidney disease, unspecified: Secondary | ICD-10-CM | POA: Diagnosis not present

## 2024-02-04 DIAGNOSIS — Z79890 Hormone replacement therapy (postmenopausal): Secondary | ICD-10-CM | POA: Diagnosis not present

## 2024-02-04 DIAGNOSIS — J4489 Other specified chronic obstructive pulmonary disease: Secondary | ICD-10-CM | POA: Diagnosis not present

## 2024-02-04 DIAGNOSIS — I872 Venous insufficiency (chronic) (peripheral): Secondary | ICD-10-CM | POA: Diagnosis not present

## 2024-02-04 DIAGNOSIS — Z6841 Body Mass Index (BMI) 40.0 and over, adult: Secondary | ICD-10-CM | POA: Diagnosis not present

## 2024-02-04 DIAGNOSIS — E785 Hyperlipidemia, unspecified: Secondary | ICD-10-CM | POA: Diagnosis not present

## 2024-02-04 DIAGNOSIS — I4891 Unspecified atrial fibrillation: Secondary | ICD-10-CM | POA: Diagnosis not present

## 2024-02-04 DIAGNOSIS — G40909 Epilepsy, unspecified, not intractable, without status epilepticus: Secondary | ICD-10-CM | POA: Diagnosis not present

## 2024-02-04 DIAGNOSIS — I700 Atherosclerosis of aorta: Secondary | ICD-10-CM | POA: Diagnosis not present

## 2024-02-04 DIAGNOSIS — Z87440 Personal history of urinary (tract) infections: Secondary | ICD-10-CM | POA: Diagnosis not present

## 2024-02-04 DIAGNOSIS — I509 Heart failure, unspecified: Secondary | ICD-10-CM | POA: Diagnosis not present

## 2024-02-04 DIAGNOSIS — D6869 Other thrombophilia: Secondary | ICD-10-CM | POA: Diagnosis not present

## 2024-02-04 DIAGNOSIS — Z7901 Long term (current) use of anticoagulants: Secondary | ICD-10-CM | POA: Diagnosis not present

## 2024-02-04 DIAGNOSIS — E039 Hypothyroidism, unspecified: Secondary | ICD-10-CM | POA: Diagnosis not present

## 2024-02-10 ENCOUNTER — Telehealth: Payer: Self-pay

## 2024-02-10 NOTE — Telephone Encounter (Signed)
 Copied from CRM [id number]. Topic: Clinical - Medical Advice >> Feb 10, 2024 10:30 AM Isabell A wrote: Reason for CRM: Patient was told to call and let provider know how medication has been helping - states Breztri  has been helping better than the Symbicort.   Callback number: [phone]  Routing to Dr Annella so he is aware

## 2024-02-12 MED ORDER — BREZTRI AEROSPHERE 160-9-4.8 MCG/ACT IN AERO: 2.0000 | INHALATION_SPRAY | Freq: Two times a day (BID) | RESPIRATORY_TRACT | 6 refills | Status: AC

## 2024-02-12 NOTE — Telephone Encounter (Signed)
 Please send order for Breztri  2 puff BID, qty 1, refills 12. Thanks!

## 2024-02-12 NOTE — Telephone Encounter (Signed)
 Breztri  sent to pharmacy & pt is aware.

## 2024-03-03 ENCOUNTER — Ambulatory Visit: Admitting: Pulmonary Disease

## 2024-03-04 ENCOUNTER — Encounter: Payer: Self-pay | Admitting: Pulmonary Disease

## 2024-03-04 ENCOUNTER — Ambulatory Visit: Admitting: Pulmonary Disease

## 2024-03-04 VITALS — BP 147/76 | HR 66 | Ht 66.0 in | Wt 278.0 lb

## 2024-03-04 DIAGNOSIS — J4540 Moderate persistent asthma, uncomplicated: Secondary | ICD-10-CM

## 2024-03-04 NOTE — Patient Instructions (Signed)
 No changes to medication  Return to clinic in 6 months or sooner if needed with Dr. Annella

## 2024-03-04 NOTE — Progress Notes (Signed)
 @Patient  ID: Shannon Hammond, female    DOB: [birth year], 78 y.o.   MRN: [id number]  Chief Complaint  Patient presents with   Medical Management of Chronic Issues    Pt states cough off and on     Referring provider: Clarice Nottingham, MD  HPI:   78 y.o. woman with history of atrial fibrillation, asthma, and PE on chronic anticoagulation who were seen for evaluation of dyspnea on exertion.  Multiple telephone encounters from our office and interim reviewed.  Overall doing well.  Breathing slightly improved.  With increased inhaler intensity to Breztri  from Symbicort.  She thinks it does help her breathe a bit better.  Her cough is slightly better.  Persistent cough.  Sometimes feels like in the chest sometimes feels like in the throat.  Denies significant postnasal drip or rhinorrhea.   HPI at initial visit:  Admitted to the hospital over the summer with shortness of breath found to be in A-fib.  Evidently oxygen levels were low.  A-fib was corrected.  Heart rates improved.  No longer hypoxemia.  Discharged home with no oxygen.  She is been short of breath before this.  Dyspnea exertion notably.  Okay at rest.  Present on longer walks, inclines, stairs.  No time of day when things are better or worse.  No position make things better or worse.  No seasonal or environmental factors she can identify to make things better or worse.  She was prescribed Symbicort some years ago in pulmonary clinic.  This was after PFTs 2014 on my review revealed no fixed obstruction but a 14% increase in FEV1 after bronchodilators.  She thinks it helped some.  Not a lot.  Albuterol  helps a little bit.  Recent labs without anemia.  Recent cardiology visit in sinus.  She is in sinus today.  Result of ambulatory cardiac monitor is pending.  She had a TTE 09/2023 that had very poor windows really hard to interpret, demonstrated MVR, RV dysfunction, grossly normal EF, could not comment further on other valves diastolic  dysfunction etc.  She notes she is mainly limited due to lower extremity pain in the setting of lymphedema.  She is much less active over time and gradually less active due to more to joint issues.  But shortness of breath also contributes.  Most recent chest x-ray 09/2023 clear lungs atelectasis of the right hemidiaphragm elevation.  07/2023 cross-sectional imaging clear noted to have pectus.    Questionaires / Pulmonary Flowsheets:   ACT:      No data to display          MMRC:     No data to display          Epworth:      No data to display          Tests:   FENO:  No results found for: NITRICOXIDE  PFT:     No data to display          WALK:     11/03/2014    4:05 PM  SIX MIN WALK  Supplimental Oxygen during Test? (L/min) No  Tech Comments: Pt able to complete 2 laps around office at a slow pace. After 2nd lap pt stated increased SOB and requested to go back to his room     Imaging: Personally reviewed and as per EMR and discussed this note No results found.  Lab Results: Personally reviewed CBC    Component Value Date/Time   WBC 8.8  11/05/2023 1159   WBC 13.3 (H) 09/30/2023 0413   RBC 4.04 11/05/2023 1159   RBC 4.49 09/30/2023 0413   HGB 11.7 11/05/2023 1159   HCT 36.5 11/05/2023 1159   PLT 342 11/05/2023 1159   MCV 90 11/05/2023 1159   MCH 29.0 11/05/2023 1159   MCH 29.0 09/30/2023 0413   MCHC 32.1 11/05/2023 1159   MCHC 33.2 09/30/2023 0413   RDW 14.8 11/05/2023 1159   LYMPHSABS 2.6 09/30/2023 0413   MONOABS 1.4 (H) 09/30/2023 0413   EOSABS 0.3 09/30/2023 0413   BASOSABS 0.1 09/30/2023 0413    BMET    Component Value Date/Time   NA 141 11/05/2023 1159   K 4.5 11/05/2023 1159   CL 103 11/05/2023 1159   CO2 20 11/05/2023 1159   GLUCOSE 84 11/05/2023 1159   GLUCOSE 94 09/30/2023 0413   BUN 19 11/05/2023 1159   CREATININE 1.77 (H) 11/05/2023 1159   CALCIUM  9.5 11/05/2023 1159   GFRNONAA 31 (L) 09/30/2023 0413   GFRAA 31 (L)  03/02/2016 0915    BNP    Component Value Date/Time   BNP 595.1 (H) 09/27/2023 0621    ProBNP    Component Value Date/Time   PROBNP 107.0 (H) 11/02/2014 1707    Specialty Problems       Pulmonary Problems   Dyspnea   Followed in Pulmonary clinic/ [facility]/ [facility]  - PFT's 04/09/12 FEV1 1.87 (74 % ) ratio 86  p 14 % improvement from saba with DLCO  66 % corrects to 86 % for alv volume   - reported better on symbicort but hfa < 25% 11/02/2014  - 11/02/2014  Walked RA x 2 laps @ 185 ft each stopped due to End of study slow pace, no desat  / no ekg changes but sob/ no cp or neck pain - D dimer indeterminate range 11/02/2014 >  VQ and venous dopplers  Neg 11/10/2014        Moderate persistent asthma without complication    Allergies  Allergen Reactions   Mirabegron Itching and Rash    Other Reaction(s): itching, Unknown    Immunization History  Administered Date(s) Administered   Influenza-Unspecified 12/17/2012, 11/17/2017    Past Medical History:  Diagnosis Date   Arthritis    BACK AND KNEES   Asthma    COPD (chronic obstructive pulmonary disease) (HCC)    DVT (deep venous thrombosis) (HCC)    GERD (gastroesophageal reflux disease)    Heart murmur    Hemorrhoids    History of kidney stones    History of nocturia    Hypertension    Hypothyroidism    PAF (paroxysmal atrial fibrillation) (HCC)    Recurrent pulmonary emboli (HCC)    Seizures (HCC) 03/19/2005   ONLY ONCE - THOUGHT TO BE STRESS INDUCED - PT WAS DEALING WITH HER MOTHER'S DEATH   Shortness of breath    PT TOLD BY HER MEDICAL DOCTOR - SOME LUNG CHANGES DUE TO 2ND HAND SMOKE - SHE WAS GIVEN INHALER TO USE AS NEEDED.    Tobacco History: Social History   Tobacco Use  Smoking Status Never  Smokeless Tobacco Never  Tobacco Comments   Never smoked 12/02/23   Counseling given: Not Answered Tobacco comments: Never smoked 12/02/23   Continue to not smoke  Outpatient Encounter Medications  as of 03/04/2024  Medication Sig   acetaminophen  (TYLENOL ) 325 MG tablet Take 650 mg by mouth every 6 (six) hours as needed for moderate pain (pain  score 4-6).   amiodarone  (PACERONE ) 200 MG tablet Take 1 tablet (200 mg total) by mouth daily.   apixaban  (ELIQUIS ) 5 MG TABS tablet Take 1 tablet (5 mg total) by mouth 2 (two) times daily.   B Complex-C (B-COMPLEX WITH VITAMIN C) tablet Take 1 tablet by mouth daily.   budesonide-formoterol  (SYMBICORT) 160-4.5 MCG/ACT inhaler Inhale 2 puffs into the lungs 2 (two) times daily.   budesonide-glycopyrrolate-formoterol  (BREZTRI  AEROSPHERE) 160-9-4.8 MCG/ACT AERO inhaler Inhale 2 puffs into the lungs in the morning and at bedtime.   budesonide-glycopyrrolate-formoterol  (BREZTRI  AEROSPHERE) 160-9-4.8 MCG/ACT AERO inhaler Inhale 2 puffs into the lungs in the morning and at bedtime.   carvedilol (COREG) 3.125 MG tablet Take 3.125 mg by mouth 2 (two) times daily.   Cholecalciferol (VITAMIN D3) 50 MCG (2000 UT) TABS Take 2,000 Units by mouth daily.   diltiazem  (CARDIZEM  SR) 120 MG 12 hr capsule Take 1 capsule (120 mg total) by mouth 2 (two) times daily.   furosemide  (LASIX ) 20 MG tablet Take 1 tablet (20 mg total) by mouth daily as needed for edema or fluid.   furosemide  (LASIX ) 40 MG tablet Take 20 mg by mouth daily.   gabapentin  (NEURONTIN ) 300 MG capsule Take 300 mg by mouth at bedtime.   levothyroxine  (SYNTHROID , LEVOTHROID) 88 MCG tablet Take 88 mcg by mouth daily before breakfast.   magnesium  oxide (MAG-OX) 400 MG tablet Take 400 mg by mouth daily. (Patient taking differently: Take 400 mg by mouth. Taking Mon, Wed, Fri)   omeprazole (PRILOSEC) 40 MG capsule Take 40 mg by mouth in the morning and at bedtime.   ondansetron  (ZOFRAN  ODT) 4 MG disintegrating tablet Take 1 tablet (4 mg total) by mouth every 8 (eight) hours as needed for nausea or vomiting.   oxybutynin (DITROPAN-XL) 10 MG 24 hr tablet Take 10 mg by mouth daily.   rosuvastatin  (CRESTOR ) 5 MG  tablet Take 5 mg by mouth every other day.   sulfamethoxazole-trimethoprim (BACTRIM DS) 800-160 MG tablet Take 1 tablet by mouth 2 (two) times daily.   traMADol  (ULTRAM ) 50 MG tablet Take 1 tablet (50 mg total) by mouth every 6 (six) hours as needed for moderate pain (pain score 4-6) or severe pain (pain score 7-10) (post-operatively).   VENTOLIN  HFA 108 (90 BASE) MCG/ACT inhaler Inhale 2 puffs into the lungs every 4 (four) hours as needed for shortness of breath.    No facility-administered encounter medications on file as of 03/04/2024.     Review of Systems  Review of Systems  N/a Physical Exam  BP (!) 147/76   Pulse 66   Ht 5' 6 (1.676 m) Comment: per pt  Wt 278 lb (126.1 kg)   SpO2 96%   BMI 44.87 kg/m   Wt Readings from Last 5 Encounters:  03/04/24 278 lb (126.1 kg)  12/03/23 271 lb (122.9 kg)  12/02/23 269 lb 3.2 oz (122.1 kg)  11/05/23 269 lb 8 oz (122.2 kg)  10/11/23 268 lb 15.4 oz (122 kg)    BMI Readings from Last 5 Encounters:  03/04/24 44.87 kg/m  12/03/23 43.74 kg/m  12/02/23 43.45 kg/m  11/05/23 43.50 kg/m  10/11/23 43.41 kg/m     Physical Exam General: Sitting in chair, no distress Eyes: No icterus Neck: No JVP appreciated Pulmonary: Clear, normal work of breathing Abdomen: Nondistended  Assessment & Plan:   Dyspnea on exertion: Suspect large related to deconditioning with joint issues lymphedema, decreased mobility etc. as well as habitus of obesity.  PFTs in the  past were consistent with asthma.  Will escalate regimen below.  See if this helps.  Recent chest x-ray is clear.  Cross-sectional imaging limited views of the lungs on CT abdomen pelvis showed no significant parenchymal finding, suspected basilar atelectasis noted.  Quite possible just cardiac contributors as well.  Her TTE 09/2023 had very poor windows but did demonstrate MVR and RV dysfunction.  Cannot, now much more due to poor windows.  This is likely contributing as well.  Atrial  fibrillation likely contributing although this seems like better controlled.  She is in sinus rhythm today.  Additionally there could be some restrictive physiology with her pectus noted on serial images.  Moderate persistent asthma/chronic bronchitis: Daily productive cough persist.  Improvement in dyspnea with change from Symbicort to Breztri .  Continue Breztri .   Return in about 6 months (around 09/02/2024) for f/u Dr. Annella.   Donnice JONELLE Annella, MD 03/04/2024

## 2024-03-10 ENCOUNTER — Other Ambulatory Visit (HOSPITAL_BASED_OUTPATIENT_CLINIC_OR_DEPARTMENT_OTHER): Payer: Self-pay | Admitting: Family

## 2024-03-10 DIAGNOSIS — Z79899 Other long term (current) drug therapy: Secondary | ICD-10-CM

## 2024-03-10 DIAGNOSIS — I480 Paroxysmal atrial fibrillation: Secondary | ICD-10-CM

## 2024-04-03 ENCOUNTER — Ambulatory Visit (HOSPITAL_BASED_OUTPATIENT_CLINIC_OR_DEPARTMENT_OTHER): Admitting: Cardiology

## 2024-04-03 ENCOUNTER — Encounter (HOSPITAL_BASED_OUTPATIENT_CLINIC_OR_DEPARTMENT_OTHER): Payer: Self-pay | Admitting: Cardiology

## 2024-04-03 VITALS — BP 128/74 | HR 92 | Ht 66.0 in | Wt 280.3 lb

## 2024-04-03 DIAGNOSIS — I480 Paroxysmal atrial fibrillation: Secondary | ICD-10-CM | POA: Diagnosis not present

## 2024-04-03 DIAGNOSIS — Z86711 Personal history of pulmonary embolism: Secondary | ICD-10-CM

## 2024-04-03 DIAGNOSIS — I10 Essential (primary) hypertension: Secondary | ICD-10-CM | POA: Diagnosis not present

## 2024-04-03 DIAGNOSIS — Z79899 Other long term (current) drug therapy: Secondary | ICD-10-CM

## 2024-04-03 DIAGNOSIS — R0609 Other forms of dyspnea: Secondary | ICD-10-CM | POA: Diagnosis not present

## 2024-04-03 DIAGNOSIS — Z5181 Encounter for therapeutic drug level monitoring: Secondary | ICD-10-CM

## 2024-04-03 DIAGNOSIS — I2510 Atherosclerotic heart disease of native coronary artery without angina pectoris: Secondary | ICD-10-CM | POA: Diagnosis not present

## 2024-04-03 DIAGNOSIS — D6869 Other thrombophilia: Secondary | ICD-10-CM | POA: Diagnosis not present

## 2024-04-03 DIAGNOSIS — R600 Localized edema: Secondary | ICD-10-CM | POA: Diagnosis not present

## 2024-04-03 DIAGNOSIS — I700 Atherosclerosis of aorta: Secondary | ICD-10-CM

## 2024-04-03 DIAGNOSIS — N1832 Chronic kidney disease, stage 3b: Secondary | ICD-10-CM | POA: Diagnosis not present

## 2024-04-03 DIAGNOSIS — I5032 Chronic diastolic (congestive) heart failure: Secondary | ICD-10-CM | POA: Diagnosis not present

## 2024-04-03 DIAGNOSIS — Z7901 Long term (current) use of anticoagulants: Secondary | ICD-10-CM

## 2024-04-03 LAB — THYROID PANEL WITH TSH
Free Thyroxine Index: 2.4 (ref 1.2–4.9)
T3 Uptake Ratio: 26 % (ref 24–39)
T4, Total: 9.2 ug/dL (ref 4.5–12.0)
TSH: 3.89 u[IU]/mL (ref 0.450–4.500)

## 2024-04-03 LAB — COMPREHENSIVE METABOLIC PANEL WITH GFR
ALT: 11 IU/L (ref 0–32)
AST: 14 IU/L (ref 0–40)
Albumin: 4.2 g/dL (ref 3.8–4.8)
Alkaline Phosphatase: 165 IU/L — ABNORMAL HIGH (ref 49–135)
BUN/Creatinine Ratio: 15 (ref 12–28)
BUN: 21 mg/dL (ref 8–27)
Bilirubin Total: 0.3 mg/dL (ref 0.0–1.2)
CO2: 22 mmol/L (ref 20–29)
Calcium: 9.6 mg/dL (ref 8.7–10.3)
Chloride: 107 mmol/L — ABNORMAL HIGH (ref 96–106)
Creatinine, Ser: 1.44 mg/dL — ABNORMAL HIGH (ref 0.57–1.00)
Globulin, Total: 2.7 g/dL (ref 1.5–4.5)
Glucose: 85 mg/dL (ref 70–99)
Potassium: 4.9 mmol/L (ref 3.5–5.2)
Sodium: 142 mmol/L (ref 134–144)
Total Protein: 6.9 g/dL (ref 6.0–8.5)
eGFR: 37 mL/min/1.73 — ABNORMAL LOW

## 2024-04-03 LAB — CBC
Hematocrit: 37.7 % (ref 34.0–46.6)
Hemoglobin: 12.1 g/dL (ref 11.1–15.9)
MCH: 27.7 pg (ref 26.6–33.0)
MCHC: 32.1 g/dL (ref 31.5–35.7)
MCV: 86 fL (ref 79–97)
Platelets: 288 x10E3/uL (ref 150–450)
RBC: 4.37 x10E6/uL (ref 3.77–5.28)
RDW: 14.9 % (ref 11.7–15.4)
WBC: 8.1 x10E3/uL (ref 3.4–10.8)

## 2024-04-03 NOTE — Addendum Note (Signed)
 Addended by: LONNI SLAIN A on: 04/03/2024 10:48 AM   Modules accepted: Orders

## 2024-04-03 NOTE — Progress Notes (Signed)
 " Cardiology Office Note:  .   Date:  04/03/2024  ID:  Shannon Hammond, DOB 12/19/1945, MRN [id number] PCP: Clarice Nottingham, MD  [facility] Providers Cardiologist:  Shelda Bruckner, MD {  History of Present Illness: .   Shannon Hammond is a 79 y.o. female with PMH recurrent DVT/PE, paroxysmal atrial fibrillation. I met her during her hospitalization 09/2023, and she was previously followed by Dr. Claudene  Today: Overall doing well. Has knee pain but trying to avoid injections until she really needs them.  Doesn't typically feel her afib. Hasn't felt any racing heartbeats. No bleeding issues.   Blood pressures have been elevated, today and at recent available visits in the computer (147/76-162/78). Hasn't check blood pressure at home recently, needs new batteries in her machine. She reports that she has never taken carvedilol, as she wasn't sure why she was ordered it. Did extensive med rec today.  Has rare/brief focal sharp pinch in her chest, no other chest pain. Breathing stable at baseline, has not needed rescue inhaler. Does notice more shortness of breath when the weather is cold. Has overall stable dyspnea on exertion. Leg swelling is variable but has remained in a stable range. Follows with [facility], was given lymphedema pumps about 8 months ago with some improvement.  ROS: Denies significant chest pain, shortness of breath at rest. No PND, orthopnea, change in LE edema or unexpected weight gain. No syncope or palpitations. ROS otherwise negative except as noted.   Studies Reviewed: SABRA    EKG:       Physical Exam:   VS:  BP 128/74 (Cuff Size: Large)   Pulse 92   Ht 5' 6 (1.676 m)   Wt 280 lb 4.8 oz (127.1 kg)   SpO2 96%   BMI 45.24 kg/m    Wt Readings from Last 3 Encounters:  04/03/24 280 lb 4.8 oz (127.1 kg)  03/04/24 278 lb (126.1 kg)  12/03/23 271 lb (122.9 kg)    GEN: Well nourished, well developed in no acute distress HEENT: Normal, moist mucous  membranes NECK: No JVD CARDIAC: regular rhythm, normal S1 and S2, no rubs or gallops. No murmur. VASCULAR: Radial and DP pulses 2+ bilaterally. No carotid bruits RESPIRATORY:  Clear to auscultation without rales, wheezing or rhonchi  ABDOMEN: Soft, non-tender, non-distended MUSCULOSKELETAL:  Ambulates independently SKIN: Warm and dry, mild bilateral nonpitting edema NEUROLOGIC:  Alert and oriented x 3. No focal neuro deficits noted. PSYCHIATRIC:  Normal affect    ASSESSMENT AND PLAN: .    Paroxysmal atrial fibrillation, with history of RVR Recurrent DVT/PE Hypercoagulable state -chadsvasc=7 -on apixaban , is 79 years old with Cr >1.5, will check labs today but anticipate dropping dose to 2.5 mg BID. Discussed this with her. She is concerned about this given her history of blood clots as well. She saw a hematologist when she lived in [place]. Does not think she was found to have hypercoagulable abnormalities. We did discuss that the 2.5 mg BID is also used for long term VTE prevention. We will confirm the recommended dose once her labs result. -placed on amiodarone  09/2023 due to paroxysmal RVR -monitor 11/2023 with 3% burden of afib, continued on amiodarone  -last monitoring labs 10/2023 with normal transaminases, normal T3/T4, mildly elevated TSH -due for monitoring labs, ordered today -on diltiazem  120 mg BID for additional rate control, blood pressure management  Aortic atherosclerosis Coronary calcification Hyperlipidemia -continue rosuvastatin  -no aspirin  as she is on apixaban  -lipids from 02/26/24. LDL 86, reasonable  for age  Hypertension Chronic diastolic heart failure Lower extremity edema Chronic kidney disease stage 3b/4 -elevated blood pressure today initially 162/78, has been elevated at last several visits, but on recheck it was normal. Discussed that she should tell providers to recheck after sitting to make sure her BP is accurate -discussed home BP monitoring -on  diltiazem , furosemide  -tried compression stockings (was measured, prescribed) but were balling up and causing pain, has not been wearing -follows with [facility], Dr. Patty, has lymphedema pumps through them  Dyspnea on exertion, chronic -echo 09/2023 (while in RVR) with EF 50-55%, no significant valve disease, difficult images -cardiac PET 11/2023 without ischemia, EF 54%. Mildly abnormal myocardial blood flow reserve -stable symptoms  Dispo: 6 mos or sooner as needed  Total time of encounter: I spent 41 minutes dedicated to the care of this patient on the date of this encounter to include pre-visit review of records, face-to-face time with the patient discussing conditions above, and clinical documentation with the electronic health record. We specifically spent time today discussing symptoms, med rec, blood pressure, afib, amiodarone  and monitoring, LE edema, history of clotting and discussion of anticoagulation   Signed, Shelda Bruckner, MD   Shelda Bruckner, MD, PhD, [facility]  [facility]  [place]  [facility] at [facility] [address], [address] [place], [place] [address] [phone]   "

## 2024-04-03 NOTE — Patient Instructions (Signed)
 Medication Instructions:  No changes today *If you need a refill on your cardiac medications before your next appointment, please call your pharmacy*  Lab Work: Today: cbc, cmet, tsh  Testing/Procedures: none  Follow-Up: At [facility], you and your health needs are our priority.  As part of our continuing mission to provide you with exceptional heart care, our providers are all part of one team.  This team includes your primary Cardiologist (physician) and Advanced Practice Providers or APPs (Physician Assistants and Nurse Practitioners) who all work together to provide you with the care you need, when you need it.  Your next appointment:   6 month(s)  Provider:   Shelda Bruckner, MD

## 2024-04-05 ENCOUNTER — Ambulatory Visit (HOSPITAL_BASED_OUTPATIENT_CLINIC_OR_DEPARTMENT_OTHER): Payer: Self-pay | Admitting: Cardiology

## 2024-04-06 ENCOUNTER — Other Ambulatory Visit: Payer: Self-pay

## 2024-04-06 DIAGNOSIS — I480 Paroxysmal atrial fibrillation: Secondary | ICD-10-CM

## 2024-04-06 DIAGNOSIS — Z79899 Other long term (current) drug therapy: Secondary | ICD-10-CM

## 2024-04-08 NOTE — Telephone Encounter (Signed)
 Patient returned RN's call regarding results.

## 2024-04-13 MED ORDER — AMIODARONE HCL 200 MG PO TABS: 200.0000 mg | ORAL_TABLET | Freq: Every day | ORAL | 3 refills | Status: AC

## 2024-06-11 ENCOUNTER — Ambulatory Visit (HOSPITAL_COMMUNITY): Admitting: Internal Medicine

## 2024-09-03 ENCOUNTER — Ambulatory Visit: Admitting: Pulmonary Disease
